# Patient Record
Sex: Female | Born: 1970
Health system: Southern US, Community
[De-identification: ages and names within clinical notes are randomized; demographics above are authoritative.]

## PROBLEM LIST (undated history)

## (undated) ENCOUNTER — Inpatient Hospital Stay (HOSPITAL_COMMUNITY): Payer: Self-pay

## (undated) DIAGNOSIS — O24419 Gestational diabetes mellitus in pregnancy, unspecified control: Secondary | ICD-10-CM

## (undated) DIAGNOSIS — E118 Type 2 diabetes mellitus with unspecified complications: Secondary | ICD-10-CM

## (undated) DIAGNOSIS — I10 Essential (primary) hypertension: Secondary | ICD-10-CM

## (undated) HISTORY — PX: DILATION AND CURETTAGE OF UTERUS: SHX78

## (undated) HISTORY — DX: Type 2 diabetes mellitus with unspecified complications: E11.8

## (undated) HISTORY — DX: Gestational diabetes mellitus in pregnancy, unspecified control: O24.419

## (undated) HISTORY — PX: MASTECTOMY: SHX3

---

## 2000-10-18 ENCOUNTER — Emergency Department (HOSPITAL_COMMUNITY): Admission: EM | Admit: 2000-10-18 | Discharge: 2000-10-18 | Payer: Self-pay | Admitting: Emergency Medicine

## 2001-10-22 ENCOUNTER — Encounter: Payer: Self-pay | Admitting: *Deleted

## 2001-10-22 ENCOUNTER — Inpatient Hospital Stay (HOSPITAL_COMMUNITY): Admission: AD | Admit: 2001-10-22 | Discharge: 2001-10-22 | Payer: Self-pay | Admitting: *Deleted

## 2001-12-05 ENCOUNTER — Emergency Department (HOSPITAL_COMMUNITY): Admission: EM | Admit: 2001-12-05 | Discharge: 2001-12-05 | Payer: Self-pay | Admitting: Emergency Medicine

## 2002-01-17 ENCOUNTER — Inpatient Hospital Stay (HOSPITAL_COMMUNITY): Admission: AD | Admit: 2002-01-17 | Discharge: 2002-01-17 | Payer: Self-pay | Admitting: Obstetrics

## 2002-04-08 ENCOUNTER — Encounter: Payer: Self-pay | Admitting: Obstetrics

## 2002-04-08 ENCOUNTER — Ambulatory Visit (HOSPITAL_COMMUNITY): Admission: RE | Admit: 2002-04-08 | Discharge: 2002-04-08 | Payer: Self-pay | Admitting: Obstetrics

## 2003-03-18 ENCOUNTER — Inpatient Hospital Stay (HOSPITAL_COMMUNITY): Admission: AD | Admit: 2003-03-18 | Discharge: 2003-03-18 | Payer: Self-pay | Admitting: Obstetrics and Gynecology

## 2003-03-28 ENCOUNTER — Inpatient Hospital Stay (HOSPITAL_COMMUNITY): Admission: AD | Admit: 2003-03-28 | Discharge: 2003-03-28 | Payer: Self-pay | Admitting: Obstetrics and Gynecology

## 2003-03-30 ENCOUNTER — Encounter: Admission: RE | Admit: 2003-03-30 | Discharge: 2003-03-30 | Payer: Self-pay | Admitting: Obstetrics and Gynecology

## 2003-04-07 ENCOUNTER — Encounter: Admission: RE | Admit: 2003-04-07 | Discharge: 2003-04-07 | Payer: Self-pay | Admitting: Internal Medicine

## 2003-10-31 ENCOUNTER — Emergency Department (HOSPITAL_COMMUNITY): Admission: EM | Admit: 2003-10-31 | Discharge: 2003-11-01 | Payer: Self-pay | Admitting: Emergency Medicine

## 2004-11-02 ENCOUNTER — Ambulatory Visit (HOSPITAL_COMMUNITY): Admission: RE | Admit: 2004-11-02 | Discharge: 2004-11-02 | Payer: Self-pay | Admitting: Obstetrics & Gynecology

## 2004-12-19 ENCOUNTER — Inpatient Hospital Stay (HOSPITAL_COMMUNITY): Admission: AD | Admit: 2004-12-19 | Discharge: 2004-12-19 | Payer: Self-pay | Admitting: *Deleted

## 2005-04-24 ENCOUNTER — Inpatient Hospital Stay (HOSPITAL_COMMUNITY): Admission: AD | Admit: 2005-04-24 | Discharge: 2005-04-24 | Payer: Self-pay | Admitting: *Deleted

## 2005-05-10 ENCOUNTER — Ambulatory Visit: Payer: Self-pay | Admitting: Obstetrics and Gynecology

## 2005-06-01 ENCOUNTER — Ambulatory Visit: Payer: Self-pay | Admitting: Gynecology

## 2005-08-10 ENCOUNTER — Ambulatory Visit: Payer: Self-pay | Admitting: Gynecology

## 2005-11-14 ENCOUNTER — Ambulatory Visit: Payer: Self-pay | Admitting: Gynecology

## 2007-02-09 ENCOUNTER — Emergency Department (HOSPITAL_COMMUNITY): Admission: EM | Admit: 2007-02-09 | Discharge: 2007-02-10 | Payer: Self-pay | Admitting: Emergency Medicine

## 2007-02-21 ENCOUNTER — Ambulatory Visit: Payer: Self-pay | Admitting: Obstetrics & Gynecology

## 2007-04-01 ENCOUNTER — Encounter: Payer: Self-pay | Admitting: Obstetrics & Gynecology

## 2007-04-01 ENCOUNTER — Ambulatory Visit (HOSPITAL_COMMUNITY): Admission: RE | Admit: 2007-04-01 | Discharge: 2007-04-01 | Payer: Self-pay | Admitting: Obstetrics & Gynecology

## 2007-04-01 ENCOUNTER — Ambulatory Visit: Payer: Self-pay | Admitting: Obstetrics & Gynecology

## 2007-05-26 ENCOUNTER — Inpatient Hospital Stay (HOSPITAL_COMMUNITY): Admission: AD | Admit: 2007-05-26 | Discharge: 2007-05-26 | Payer: Self-pay | Admitting: Obstetrics & Gynecology

## 2007-06-18 ENCOUNTER — Ambulatory Visit: Payer: Self-pay | Admitting: Obstetrics and Gynecology

## 2007-09-05 ENCOUNTER — Encounter: Payer: Self-pay | Admitting: Obstetrics & Gynecology

## 2007-09-05 ENCOUNTER — Ambulatory Visit: Payer: Self-pay | Admitting: Obstetrics & Gynecology

## 2007-11-07 ENCOUNTER — Encounter: Payer: Self-pay | Admitting: Obstetrics & Gynecology

## 2007-11-07 ENCOUNTER — Ambulatory Visit: Payer: Self-pay | Admitting: Obstetrics & Gynecology

## 2007-11-07 LAB — CONVERTED CEMR LAB
ALT: 20 units/L (ref 0–35)
Albumin: 3.8 g/dL (ref 3.5–5.2)
Alkaline Phosphatase: 55 units/L (ref 39–117)
BUN: 8 mg/dL (ref 6–23)
CO2: 22 meq/L (ref 19–32)
Calcium: 8.5 mg/dL (ref 8.4–10.5)
Creatinine, Ser: 0.67 mg/dL (ref 0.40–1.20)
Potassium: 4.1 meq/L (ref 3.5–5.3)
Sodium: 140 meq/L (ref 135–145)
TSH: 2.126 microintl units/mL (ref 0.350–4.50)
Total Bilirubin: 0.3 mg/dL (ref 0.3–1.2)

## 2007-12-12 ENCOUNTER — Encounter: Payer: Self-pay | Admitting: Obstetrics & Gynecology

## 2007-12-12 ENCOUNTER — Ambulatory Visit: Payer: Self-pay | Admitting: Obstetrics and Gynecology

## 2007-12-12 LAB — CONVERTED CEMR LAB
ALT: 18 units/L (ref 0–35)
AST: 18 units/L (ref 0–37)
BUN: 12 mg/dL (ref 6–23)
CO2: 21 meq/L (ref 19–32)
Chloride: 107 meq/L (ref 96–112)
Hemoglobin: 10.2 g/dL — ABNORMAL LOW (ref 12.0–15.0)
Platelets: 463 10*3/uL — ABNORMAL HIGH (ref 150–400)
RBC: 4.59 M/uL (ref 3.87–5.11)
RDW: 16 % — ABNORMAL HIGH (ref 11.5–15.5)
Total Bilirubin: 0.2 mg/dL — ABNORMAL LOW (ref 0.3–1.2)
Total Protein: 7.3 g/dL (ref 6.0–8.3)

## 2008-10-02 ENCOUNTER — Inpatient Hospital Stay (HOSPITAL_COMMUNITY): Admission: AD | Admit: 2008-10-02 | Discharge: 2008-10-02 | Payer: Self-pay | Admitting: Obstetrics & Gynecology

## 2008-12-12 ENCOUNTER — Emergency Department (HOSPITAL_COMMUNITY): Admission: EM | Admit: 2008-12-12 | Discharge: 2008-12-12 | Payer: Self-pay | Admitting: Emergency Medicine

## 2009-01-08 HISTORY — PX: HYSTEROSCOPY WITH RESECTOSCOPE: SHX5395

## 2009-02-01 ENCOUNTER — Emergency Department (HOSPITAL_COMMUNITY): Admission: EM | Admit: 2009-02-01 | Discharge: 2009-02-01 | Payer: Self-pay | Admitting: Emergency Medicine

## 2009-02-06 ENCOUNTER — Inpatient Hospital Stay (HOSPITAL_COMMUNITY): Admission: AD | Admit: 2009-02-06 | Discharge: 2009-02-06 | Payer: Self-pay | Admitting: Obstetrics and Gynecology

## 2009-02-06 ENCOUNTER — Ambulatory Visit: Payer: Self-pay | Admitting: Obstetrics and Gynecology

## 2009-07-28 ENCOUNTER — Encounter: Admission: RE | Admit: 2009-07-28 | Discharge: 2009-07-28 | Payer: Self-pay | Admitting: Family Medicine

## 2009-10-07 ENCOUNTER — Ambulatory Visit (HOSPITAL_COMMUNITY): Admission: RE | Admit: 2009-10-07 | Discharge: 2009-10-07 | Payer: Self-pay | Admitting: Obstetrics and Gynecology

## 2009-12-30 ENCOUNTER — Inpatient Hospital Stay (HOSPITAL_COMMUNITY)
Admission: AD | Admit: 2009-12-30 | Discharge: 2009-12-30 | Payer: Self-pay | Source: Home / Self Care | Attending: Obstetrics and Gynecology | Admitting: Obstetrics and Gynecology

## 2010-03-20 LAB — POCT PREGNANCY, URINE: Preg Test, Ur: NEGATIVE

## 2010-03-20 LAB — URINALYSIS, ROUTINE W REFLEX MICROSCOPIC
Bilirubin Urine: NEGATIVE
Specific Gravity, Urine: 1.02 (ref 1.005–1.030)
Urobilinogen, UA: 0.2 mg/dL (ref 0.0–1.0)
pH: 5.5 (ref 5.0–8.0)

## 2010-03-23 LAB — BASIC METABOLIC PANEL
BUN: 6 mg/dL (ref 6–23)
Calcium: 9.2 mg/dL (ref 8.4–10.5)
GFR calc non Af Amer: >60 mL/min (ref >60)
Potassium: 4.2 mEq/L (ref 3.5–5.1)

## 2010-03-23 LAB — CBC
HCT: 29.9 % — ABNORMAL LOW (ref 36.0–46.0)
MCH: 23.9 pg — ABNORMAL LOW (ref 26.0–34.0)
MCHC: 32.9 g/dL (ref 30.0–36.0)
MCV: 72.6 fL — ABNORMAL LOW (ref 78.0–100.0)
Platelets: 351 10*3/uL (ref 150–400)
WBC: 8 10*3/uL (ref 4.0–10.5)

## 2010-03-23 LAB — HCG, SERUM, QUALITATIVE: Preg, Serum: NEGATIVE

## 2010-04-11 LAB — POCT I-STAT, CHEM 8
BUN: 5 mg/dL — ABNORMAL LOW (ref 6–23)
Chloride: 100 mEq/L (ref 96–112)
Glucose, Bld: 263 mg/dL — ABNORMAL HIGH (ref 70–99)
TCO2: 24 mmol/L (ref 0–100)

## 2010-04-14 LAB — HCG, QUANTITATIVE, PREGNANCY: hCG, Beta Chain, Quant, S: <2 m[IU]/mL (ref <5)

## 2010-04-14 LAB — WET PREP, GENITAL
Clue Cells Wet Prep HPF POC: NONE SEEN
Trich, Wet Prep: NONE SEEN

## 2010-04-14 LAB — CBC
HCT: 34.6 % — ABNORMAL LOW (ref 36.0–46.0)
Hemoglobin: 11.4 g/dL — ABNORMAL LOW (ref 12.0–15.0)
MCV: 74.3 fL — ABNORMAL LOW (ref 78.0–100.0)
RBC: 4.66 MIL/uL (ref 3.87–5.11)

## 2010-04-14 LAB — URINALYSIS, ROUTINE W REFLEX MICROSCOPIC
Glucose, UA: NEGATIVE mg/dL
pH: 5.5 (ref 5.0–8.0)

## 2010-04-14 LAB — URINE MICROSCOPIC-ADD ON

## 2010-04-14 LAB — ABO/RH: ABO/RH(D): O POS

## 2010-05-23 NOTE — Op Note (Signed)
[name], [name]                ACCOUNT NO.:  [id number]   MEDICAL RECORD NO.:  [id number]          PATIENT TYPE:  AMB   LOCATION:  [facility]C                           [facility]   PHYSICIAN:  Allie Bossier, MD        DATE OF BIRTH:  [birth year]   DATE OF PROCEDURE:  04/01/2007  DATE OF DISCHARGE:                               OPERATIVE REPORT   PREOPERATIVE DIAGNOSES:  1. Anemia.  2. Dysfunctional uterine bleeding.  3. Obesity.   POSTOPERATIVE DIAGNOSES:  1. Anemia.  2. Dysfunctional uterine bleeding.  3. Obesity.   PROCEDURE:  Hysteroscopy D&C.   FINDINGS:  Uterine polyp.   SURGEON:  Clarisa Kindred, MD   ANESTHESIA:  LMA.  Oddono, MD   COMPLICATIONS:  None.   ESTIMATED BLOOD LOSS:  Minimal.   SPECIMEN:  Uterine curettings with polyp.   DETAILED PROCEDURE AND FINDINGS:  The risks, benefits, alternatives of  surgery were explained, understood, and accepted.  Consents were signed.  She was taken to the operating room; general anesthesia was applied  without complication.  She was placed in dorsal lithotomy position.  Her  vagina was prepped and draped usual sterile fashion.  Her bladder was  emptied with a Robinson catheter.  A bimanual exam revealed a normal  size and shape, anteverted, mobile uterus.  Her adnexa were not  enlarged.  A speculum was placed.  Her paracervix was grasped on the  anterior lip of the cervix with a single-tooth tenaculum.  Cervix was  easily dilated with Hanks dilators to accommodate a diagnostic  hysteroscope.  Glycine was the distention media.  Most of the uterine  cavity appeared atrophic with the obvious exception being a 1 cm polyp  dangling from the posterior wall towards the right of the uterus.  A  sharp curettage with serrated edges was used in all quadrants and the  fundus of the uterus until a gritty sensation was appreciated  throughout.  Hysteroscopy was repeated.  A picture was taken, and the  polyp was  gone.  No bleeding was noted.   The tenaculum was removed.  No bleeding  was noted from that site either.  Net loss of the glycine was  approximately 20 mL.  She was extubated and taken to the recovery room  in stable condition.  These instrument, sponge, and needle counts were  correct.      Allie Bossier, MD  Electronically Signed     MCD/MEDQ  D:  04/01/2007  T:  04/01/2007  Job:  [id number]

## 2010-05-23 NOTE — Group Therapy Note (Signed)
Desiree, [name] NO.:  [id number]   MEDICAL RECORD NO.:  [id number]          PATIENT TYPE:  WOC   LOCATION:  [facility]                   FACILITY:  [facility]   PHYSICIAN:  Odie Sera, D.O.    DATE OF BIRTH:  05-20-1970   DATE OF SERVICE:                                  CLINIC NOTE   REASON FOR VISIT:  [name] is a 40 year old nulliparous patient who  has been followed for infertility and presents here today for  reevaluation after her last appointment 2 months ago.  She notes at this  time she is having a period of bleeding that began approximately 2 weeks  ago; however, this is normal and she relates a history that well  describes an ovulatory bleeding to include cycles that occur  infrequently sometimes several months apart and often has episodes of  bleeding lasting 1-3 weeks.  She has been previously worked up for an  fertility in the past and has had numerous cycles of Clomid without  success.  Of note, she tried a cycle of Clomid and after her last visit  in August; however, they did not realize that there were refills on the  package, so they only did one cycle.  Otherwise she denies any abdominal  pain.   PAST MEDICAL HISTORY:  Significant only for infertility.   She has no known drug allergies.   CURRENT MEDICATIONS:  Iron 1 pill 3 times daily.   Current menstrual cycle had began on October 25, 2007, and her last Pap  smear was August 2009.   On evaluation today, she had a blood pressure of 140/76, heart rate of  89, respiratory rate of 20 and temperature 97.6.  She is 5 feet 5 inches  tall and weighs approximately 213 pounds.  This corresponds to a BMI of  35.  Her lungs were clear to auscultation bilaterally.  Her abdomen is  soft, nontender, no masses appreciated and is obese.  Her skin exam, she  is noted to have fine hair along her forearms and chin.   ASSESSMENT AND PLAN:  Primary infertility.  Given the patient's history  and  report of a normal semen analysis in the husband, I suspect that the  patient is essentially anovulatory.  Her bleeding definitely is  descriptive for anovulatory bleeding.  She has apparently had numerous  courses of Clomid without results.  On review of her records, her last  TSH is from 2005, so I signed a repeat one again today.  Additionally,  the patient appears to clinically meet the criteria for polycystic  ovarian syndrome and it does not appear that she has had a trial of  metformin.  Therefore, the patient was given a prescription for  metformin ER 500 mg twice a day in addition to another 2 courses of  Provera and Clomid 50 mg.  The patient was instructed to follow up in 1  month for recheck of her labs, to recheck a creatinine and LFTs after  the patient had been on metformin for 1 month.  Also, instructed on  use of metformin, as well  as the rare side effect of hypoglycemia and  helped to handle that.  The patient will follow up in 47-month to see Dr.  Noel Gerold.           ______________________________  Odie Sera, D.O.     MC/MEDQ  D:  11/07/2007  T:  11/07/2007  Job:  [id number]

## 2010-05-23 NOTE — H&P (Signed)
[name], [name]                ACCOUNT NO.:  [id number]   MEDICAL RECORD NO.:  [id number]          PATIENT TYPE:  AMB   LOCATION:  [facility]                           [facility]   PHYSICIAN:  Allie Bossier, MD        DATE OF BIRTH:  Feb 26, 1970   DATE OF ADMISSION:  04/01/2007  DATE OF DISCHARGE:                              HISTORY & PHYSICAL   [name] is a 40 year old married black gravida 1, para 1 with no living  children.  She has been having dysfunctional heavy bleeding for several  years, and her hemoglobin was noted to be 6 in 2007.  She has had  multiple ultrasounds that have shown no specific abnormalities.  Saline  infusion sonography was recommended; however, with her continuous  vaginal bleeding, this has not been performed.  Her hemoglobin then  increased to 7.8 in February of 2009 when she showed up in maternal  admissions unit.  She has been treated with p.o. Provera with some  improvement of her vaginal bleeding.  In fact, her hemoglobin yesterday  was up to 11.5.  She has been trying to achieve pregnancy for several  years, and in fact has had HSG which was done in 2006 which was reported  as normal.  Lab workup for her anemia has been normal (TSH, Von  Willebrand's panel).   SOCIAL HISTORY:  Negative for tobacco, alcohol or drug use.   FAMILY HISTORY:  His is negative for breast, GYN and colon malignancies.   ALLERGIES:  NO KNOWN DRUG ALLERGIES.  NO LATEX ALLERGIES.   MEDICATIONS:  1. Vicodin p.r.n. (she had a tooth extraction recently).  2. Iron pills daily.   PAST SURGICAL HISTORY:  She had a tooth extraction.   PAST OBSTETRICAL HISTORY:  She had a NSVD in 1998.   REVIEW OF SYSTEMS:  She is married.  She is trying to achieve pregnancy.   PHYSICAL EXAMINATION:  VITAL SIGNS:  She is 215 pounds, height 5 feet 5  inches.  Vital signs stable, afebrile.  HEENT: Normal.  HEART:  Regular rate and rhythm.  LUNGS:  Clear to auscultation bilaterally.  ABDOMEN:   Obese, benign.  PELVIC:  External genitalia normal.  No adnexal masses, but the exam is  limited by her habitus.  Her uterus is normal size and shape, anteverted  and mobile.   ASSESSMENT AND PLAN:  Dysfunctional uterine bleeding and resulting  anemia (severe at times).  Will plan for a dilatation and curettage and  hysteroscopy.  I have recommended her to stay on a multivitamin with  folic acid daily since she is attempting to become pregnant.      Allie Bossier, MD  Electronically Signed     MCD/MEDQ  D:  04/01/2007  T:  04/01/2007  Job:  [id number]

## 2010-05-23 NOTE — Group Therapy Note (Signed)
NAMEFAYTH, [name] NO.:  [id number]   MEDICAL RECORD NO.:  [id number]          PATIENT TYPE:  WOC   LOCATION:  [facility]                   [facility]AC[facility]   PHYSICIAN:  Elsie Lincoln, MD      DATE OF BIRTH:  [birth year]   DATE OF SERVICE:                                  CLINIC NOTE   Ms. Pal returns to [place] for amenorrhea again.  The patient had D&C,  hysteroscopy, and removal of what was thought to be a polyp in March  2009.  Pathology returned normal endometrium.  The patient has irregular  periods, one every 1-3 months.  She has had a history of DUB in the past  with severe anemia in the past and was on Depo.  She had been on Depo  for many years.  She has had normal semen analysis per the husband, and  she also has had a normal hysterosalpingogram, which showed both  fallopian tubes filled and demonstrated normal morphology, and this was  done in August 2006.  The patient has had infertility treatments by Dr.  Gaynell Face, Dr. August Saucer, and Dr. Chevis Pretty.  The patient has had Clomid in the  past and did not get pregnant.  She has been pregnant once in the past  at [place], but apparently that baby passed away.  I do not see any Pap  smear in the chart, so we will do that today.  She probably has PCO and  needs to be cycled, as she does not have endometrial hyperplasia, and we  will do that today.  We will also give her some Clomid and try 3 cycles  after that.  We will refer her once, I do not think we can help her any  more.  Today's visit is for amenorrhea.   PHYSICAL EXAMINATION:  VITAL SIGNS:  Temperature 97.3, pulse 86, blood  pressure 132/84, weight 214, and height 5 feet 6 inches.  GENERAL:  Well nourished, well developed, in no apparent distress.  ABDOMEN:  Soft, nontender.  No rebound.  No guarding.  No organomegaly.  No hernia.  GENITALIA:  Tanner V.  Vagina pink, normal rugae.  Cervix, closed and  nontender.   ASSESSMENT AND PLAN:  The patient is a  40 year old female for  amenorrhea, polycystic ovary, and infertility secondary to her medical  problems.  1. Pap smear done today.  2. We will give Provera withdrawal bleed.  3. We will try Clomid 50 mg.  4. I think she needs a referral to a true reproductive endocrinologist      as I think we have exhausted on that this year.  They are      interested in intrauterine insemination (IUI)  but [facility]      [facility] can no longer do this.           ______________________________  Elsie Lincoln, MD     KL/MEDQ  D:  09/05/2007  T:  09/06/2007  Job:  [id number]

## 2010-05-23 NOTE — Group Therapy Note (Signed)
[name], [name] NO.:  [id number]   MEDICAL RECORD NO.:  [id number]          PATIENT TYPE:  WOC   LOCATION:  [facility]                   [facility]AC[facility]   PHYSICIAN:  Argentina Donovan, MD        DATE OF BIRTH:  1970/05/06   DATE OF SERVICE:  06/18/2007                                  CLINIC NOTE   The patient is a 40 year old African female, gravida 1, para 1-0-0-0,  whose baby passed away in [place].  The patient, herself, speaks a strange  dialect from which we have to go to [place] for translation.  The  dialect is Hausa.  Her husband, however, does speak pretty good [place],  since he spent most of his life in [place].  The problem is the patient  has been bothered by dysfunctional uterine bleeding and in March was  placed on Depo-Provera to control that.  It has well controlled it, but  the patient desires to get pregnant.  I told them to come back at the  end of July and would start them to try and get them to ovulate on  Clomid at that particular time.  The patient has had an  hysterosalpingogram in the past, which was normal.  My feeling is that  she has polycystic ovarian syndrome, although the ultrasound that she  had was normal.  We expect the patient back at the end of July.  My plan  is to start her on Clomid at that particular time in order to try and  end with a conception.           ______________________________  Argentina Donovan, MD     PR/MEDQ  D:  06/18/2007  T:  06/18/2007  Job:  [id number]

## 2010-05-23 NOTE — Group Therapy Note (Signed)
NAMEKAMALI, SAKATA NO.:  [id number]   MEDICAL RECORD NO.:  [id number]          PATIENT TYPE:  WOC   LOCATION[facility]                   [facility]   PHYSICIAN:  Allie Bossier, MD        DATE OF BIRTH:  1970/04/02   DATE OF SERVICE:  12/12/2007                                  CLINIC NOTE   VITAL SIGNS:  Temperature 97.4, pulse 84, blood pressure 121/81,  respirations 16, weight 212.2 pounds.  Height 5 feet 5-1/4 inches.   Last menstrual period ended 10 days ago; however, the patient cannot  remember what day it started.   CHIEF COMPLAINT:  Infertility.   HISTORY:  Mrs. Henigan is a 40 year old gravida 1, para 1-0-0-0.  She did  have one baby in [place] who did not live.  She has been followed in the  clinic for infertility for sometime and then she presents here today for  followup for infertility and labs that were done at her last appointment  on November 07, 2007.  In short, she is a patient who has had multiple  trials of Clomid and gives a history of irregular bleeding that appears  to be anovulatory. She has had a normal hysterosalpingogram in 2006.  Her husband has had a normal semen analysis.  It has been thought for  awhile that she probably has polycystic ovarian syndrome.  On her last  visit she was started on metformin ER 500 mg p.o. b.i.d.  She was also  given another two courses of Provera and Clomid 50 mg.  She was asked to  follow up in one month to recheck her labs and see how she was doing on  the metformin.  She reports to me that she has had no problems with the  metformin. As mentioned previously her last menstrual period ended 10  days ago but she does remember exactly when it started.  She reports  that she has taken her two cycles of Provera and Clomid.   LABORATORY DATA:  Her labs were mostly within normal limits with the  exception of a glucose which was 115.  Creatinine was 0.67.  Her thyroid  studies were normal with a TSH  of 2.126, a free T4 of 1.17.  Her liver  function tests were within normal limits.  Of note, she also has a  history of severe anemia and the last CBC that I see on the chart is  from February 2009 where she had a hemoglobin of 7.8 and an iron study  showed her to be severely iron deficient with an iron level of 29 and a  percent saturation of 7, reticulocyte count of 4.9%.  I do not see  repeat CBCs of hemoglobin since that time.   PHYSICAL EXAMINATION:  GENERAL:  She is very pleasant, overweight woman  with perhaps some mild hirsutism and perhaps acanthosis nigricans of the  neck.  LUNGS:  Clear to auscultation bilaterally.  HEART:  Regular rate and rhythm.  No murmur, rub, or gallops.  ABDOMEN:  Obese.  Soft, nontender, nondistended with normoactive bowel  sounds.   ASSESSMENT/PLAN:  The patient here for primary infertility and given  that she has had a number of cycles of Clomid and Provera, it is  probably time to move on to the next level. Hopefully, the metformin  will help with her infertility if she indeed has polycystic ovarian  syndrome, but she is likely to need a reproductive specialist to help  her get pregnant.  So today we will recheck a CMET and will go ahead and  check a CBC since she apparently has not had one in quite awhile.  We  will increase her metformin ER to 1000 mg p.o. b.i.d. and will refer her  on to a  reproductive specialist at the time of this dictation. R.N. Marijean Bravo is discussing with them the referral process for reproductive  endocrinology.     ______________________________  Asher Muir    ______________________________  Allie Bossier, MD    /MEDQ  D:  12/12/2007  T:  12/12/2007  Job:  [id number]

## 2010-05-26 NOTE — Group Therapy Note (Signed)
NAMEJENYFER, [name] NO.:  [id number]   MEDICAL RECORD NO.:  [id number]          PATIENT TYPE:  WOC   [facility]ATION[facility]                   [facility]   PHYSICIAN:  Argentina Donovan, MD        DATE OF BIRTH:  [birth year]   DATE OF SERVICE:                                    CLINIC NOTE   HISTORY OF PRESENT ILLNESS:  The patient is a 40 year old gravida 1, para 1-  0-0-0 who lost her baby at 30 months' old while living in [place].  Her husband was here working at the time.  He brought her over in  2001, and they tried to get pregnant.  They had a workup with Dr. __________  from whom we are trying to get records.  He has had multiple sperm counts  and then normal hysterosalpingogram.  However, this visit is due to  something else.  She had been bleeding for 2 months, and has had a history  of somewhat irregular periods, and went into the MAU on April 24, 2005, had  a hemoglobin of 6.0 with a hematocrit of 19.0.  At that time, they put her  on Loestrin Fe.  Prior to that, last month, she saw a doctor who started her  on Provera for 10 days.  That did stop her bleeding, but when she stopped  the Provera, the bleeding once again became extremely heavy.  She is  bleeding as of today, had a Pap smear less than a year ago, and has an  ultrasound which shows a normal pelvic anatomy.  We talked to them about  stopping the periods long enough to let her build up her hemoglobin to  normal and then we could look into her infertility if she desired.  We are  going to put her on Ferro-Sequels as she does have a problem with  constipation, and have her checked again in 2 months.  We have also  counseled them to come back if the bleeding has not stopped significantly  within the next few days.  We are going to start her on Depo Provera 150  and, as I said,  Ferro-Sequels.   IMPRESSION:  1.  Probable dysfunctional uterine bleeding.  2.  Infertility.     ______________________________  Argentina Donovan, MD     PR/MEDQ  D:  05/10/2005  T:  05/10/2005  Job:  [id number]

## 2010-09-29 LAB — BASIC METABOLIC PANEL
CO2: 25
Chloride: 103
Creatinine, Ser: 0.78
GFR calc Af Amer: >60
Potassium: 3.6
Sodium: 135

## 2010-09-29 LAB — CBC
HCT: 23.4 — ABNORMAL LOW
Hemoglobin: 7.8 — CL
MCHC: 33.2
MCV: 77.6 — ABNORMAL LOW
RBC: 3.02 — ABNORMAL LOW
WBC: 9.7

## 2010-09-29 LAB — ABO/RH: ABO/RH(D): O POS

## 2010-09-29 LAB — CROSSMATCH

## 2010-09-29 LAB — POCT PREGNANCY, URINE: Operator id: 297281

## 2010-09-29 LAB — URINALYSIS, ROUTINE W REFLEX MICROSCOPIC
Bilirubin Urine: NEGATIVE
Glucose, UA: NEGATIVE
Hgb urine dipstick: NEGATIVE
Protein, ur: NEGATIVE

## 2010-09-29 LAB — DIFFERENTIAL
Basophils Relative: 0
Eosinophils Absolute: 0.1
Lymphs Abs: 2
Monocytes Absolute: 0.5
Monocytes Relative: 5
Neutrophils Relative %: 73

## 2010-09-29 LAB — WET PREP, GENITAL
Clue Cells Wet Prep HPF POC: NONE SEEN
Trich, Wet Prep: NONE SEEN
Yeast Wet Prep HPF POC: NONE SEEN

## 2010-09-29 LAB — GC/CHLAMYDIA PROBE AMP, GENITAL: Chlamydia, DNA Probe: NEGATIVE

## 2010-09-29 LAB — RPR: RPR Ser Ql: NONREACTIVE

## 2010-10-02 LAB — CBC
HCT: 34.6 — ABNORMAL LOW
Hemoglobin: 11.5 — ABNORMAL LOW
MCHC: 33.2
RBC: 4.53
RDW: 15.3

## 2010-10-04 LAB — CBC
HCT: 24.8 — ABNORMAL LOW
Hemoglobin: 8.3 — ABNORMAL LOW
RBC: 3.42 — ABNORMAL LOW
RDW: 16.4 — ABNORMAL HIGH

## 2010-10-04 LAB — POCT PREGNANCY, URINE: Operator id: 120561

## 2011-08-07 ENCOUNTER — Inpatient Hospital Stay (HOSPITAL_COMMUNITY)
Admission: AD | Admit: 2011-08-07 | Discharge: 2011-08-07 | Disposition: A | Discharge status: Home / Self Care | Payer: BC Managed Care – PPO | Source: Ambulatory Visit | Attending: Family Medicine | Admitting: Family Medicine

## 2011-08-07 ENCOUNTER — Encounter (HOSPITAL_COMMUNITY): Payer: Self-pay | Admitting: *Deleted

## 2011-08-07 DIAGNOSIS — N938 Other specified abnormal uterine and vaginal bleeding: Secondary | ICD-10-CM | POA: Insufficient documentation

## 2011-08-07 DIAGNOSIS — N949 Unspecified condition associated with female genital organs and menstrual cycle: Secondary | ICD-10-CM | POA: Insufficient documentation

## 2011-08-07 MED ORDER — IBUPROFEN 600 MG PO TABS
600.0000 mg | ORAL_TABLET | Freq: Once | ORAL | Status: AC
  Administered 2011-08-07: 600 mg via ORAL
  Filled 2011-08-07: qty 1

## 2011-08-07 NOTE — MAU Note (Signed)
NO BIRTH CONTROL.  LAST SEX- YESTERDAY. DID HOME PREG TEST- 2 WEEKS AGO-    UNSURE OF RESULTS..     . SAYS VAG BLEEDING  STARTED 0130- WHEN SHE WIPED.. NO PAD ON.

## 2011-10-30 ENCOUNTER — Emergency Department (HOSPITAL_COMMUNITY)
Admission: EM | Admit: 2011-10-30 | Discharge: 2011-10-30 | Disposition: A | Discharge status: Home / Self Care | Payer: Self-pay | Attending: Emergency Medicine | Admitting: Emergency Medicine

## 2011-10-30 ENCOUNTER — Encounter (HOSPITAL_COMMUNITY): Payer: Self-pay | Admitting: Vascular Surgery

## 2011-10-30 DIAGNOSIS — N949 Unspecified condition associated with female genital organs and menstrual cycle: Secondary | ICD-10-CM | POA: Insufficient documentation

## 2011-10-30 DIAGNOSIS — D649 Anemia, unspecified: Secondary | ICD-10-CM | POA: Insufficient documentation

## 2011-10-30 DIAGNOSIS — R7309 Other abnormal glucose: Secondary | ICD-10-CM | POA: Insufficient documentation

## 2011-10-30 DIAGNOSIS — R739 Hyperglycemia, unspecified: Secondary | ICD-10-CM

## 2011-10-30 DIAGNOSIS — N938 Other specified abnormal uterine and vaginal bleeding: Secondary | ICD-10-CM | POA: Insufficient documentation

## 2011-10-30 DIAGNOSIS — Z9889 Other specified postprocedural states: Secondary | ICD-10-CM | POA: Insufficient documentation

## 2011-10-30 LAB — CBC WITH DIFFERENTIAL/PLATELET
Basophils Absolute: 0.1 10*3/uL (ref 0.0–0.1)
Eosinophils Relative: 3 % (ref 0–5)
Lymphocytes Relative: 34 % (ref 12–46)
MCV: 67 fL — ABNORMAL LOW (ref 78.0–100.0)
Monocytes Relative: 7 % (ref 3–12)
Neutrophils Relative %: 55 % (ref 43–77)
Platelets: 324 10*3/uL (ref 150–400)
RBC: 3.52 MIL/uL — ABNORMAL LOW (ref 3.87–5.11)
RDW: 17.5 % — ABNORMAL HIGH (ref 11.5–15.5)
WBC: 9.8 10*3/uL (ref 4.0–10.5)

## 2011-10-30 LAB — URINE MICROSCOPIC-ADD ON

## 2011-10-30 LAB — URINALYSIS, ROUTINE W REFLEX MICROSCOPIC
Glucose, UA: >1000 mg/dL — AB
Hgb urine dipstick: NEGATIVE
Protein, ur: NEGATIVE mg/dL
pH: 6 (ref 5.0–8.0)

## 2011-10-30 LAB — BASIC METABOLIC PANEL
Chloride: 95 mEq/L — ABNORMAL LOW (ref 96–112)
GFR calc Af Amer: >90 mL/min (ref >90)
GFR calc non Af Amer: >90 mL/min (ref >90)
Potassium: 4 mEq/L (ref 3.5–5.1)
Sodium: 132 mEq/L — ABNORMAL LOW (ref 135–145)

## 2011-10-30 LAB — PREGNANCY, URINE: Preg Test, Ur: NEGATIVE

## 2011-10-30 MED ORDER — FERROUS SULFATE 325 (65 FE) MG PO TABS: 325.0000 mg | ORAL_TABLET | Freq: Every day | ORAL | Status: DC

## 2011-10-30 NOTE — ED Notes (Signed)
Pt reports that she has been bleeding heavily for a month now. States that she has to change her pad about 3 times in one hour for a normal period she would only change it one time per hour. Before this episode it had been 3 months since her last period.States that for the past week she has had been feeling weak and lightheaded for the past week. Also reports an intermittent HA for the past month.

## 2011-10-30 NOTE — ED Provider Notes (Signed)
History     CSN: [id number]  Arrival date & time 10/30/11  0104   First MD Initiated Contact with Patient 10/30/11 [phone]      Chief Complaint  Patient presents with  . Vaginal Bleeding    (Consider location/radiation/quality/duration/timing/severity/associated sxs/prior treatment) HPI 41 year old female presents to emergency room complaining of one month of ongoing vaginal bleeding. She reports bleeding has been heavy at times up to 3 pads an hour. Over the last week bleeding has decreased somewhat. Patient reports she is now getting weak and dizzy when standing. Patient has had ongoing dysfunctional uterine bleeding for many years. She and her husband report frequent visits to the [facility] clinics without specific diagnosis. She has had a D&C twice for heavy menstrual bleeding. Patient had been on iron in the past, not currently. No fevers or chills, no urinary complaints. No vaginal discharge. Patient reports low abdominal pain which is no worse than her baseline  History reviewed. No pertinent past medical history.  Past Surgical History  Procedure Date  . Dilation and curettage of uterus   . Hysteroscopy with resectoscope 2011    No family history on file.  History  Substance Use Topics  . Smoking status: Never Smoker   . Smokeless tobacco: Not on file  . Alcohol Use: No    OB History    Grav Para Term Preterm Abortions TAB SAB Ect Mult Living   1 0 0             Review of Systems  Unable to perform ROS language barrier  Allergies  Review of patient's allergies indicates no known allergies.  Home Medications  No current outpatient prescriptions on file.  BP 139/83  Pulse 95  Temp 98.2 F (36.8 C) (Oral)  Resp 20  SpO2 99%  LMP 10/30/2011  Physical Exam  Nursing note and vitals reviewed. Constitutional: She is oriented to person, place, and time. She appears well-developed and well-nourished.  HENT:  Head: Normocephalic and atraumatic.  Nose:  Nose normal.  Mouth/Throat: Oropharynx is clear and moist.  Eyes: Conjunctivae normal and EOM are normal. Pupils are equal, round, and reactive to light.  Neck: Normal range of motion. Neck supple. No JVD present. No tracheal deviation present. No thyromegaly present.  Cardiovascular: Normal rate, regular rhythm, normal heart sounds and intact distal pulses.  Exam reveals no gallop and no friction rub.   No murmur heard. Pulmonary/Chest: Effort normal and breath sounds normal. No stridor. No respiratory distress. She has no wheezes. She has no rales. She exhibits no tenderness.  Abdominal: Soft. Bowel sounds are normal. She exhibits no distension and no mass. There is tenderness (mild lower abdominal tenderness without rebound or guarding). There is no rebound and no guarding.  Genitourinary:       External genitalia normal, dark blood noted in vaginal vault. No active bleeding at this time. Cervix normal, off closed. No cervical motion tenderness. Mild diffuse tenderness on bimanual without masses noted, no specific pain in the adnexa or uterus  Musculoskeletal: Normal range of motion. She exhibits no edema and no tenderness.  Lymphadenopathy:    She has no cervical adenopathy.  Neurological: She is alert and oriented to person, place, and time. She has normal reflexes. She exhibits normal muscle tone. Coordination normal.  Skin: Skin is warm and dry. No rash noted. No erythema. No pallor.  Psychiatric: She has a normal mood and affect. Her behavior is normal. Judgment and thought content normal.    ED  Course  Procedures (including critical care time)  Labs Reviewed  URINALYSIS, ROUTINE W REFLEX MICROSCOPIC - Abnormal; Notable for the following:    Glucose, UA >1000 (*)     Ketones, ur 15 (*)     Leukocytes, UA SMALL (*)     All other components within normal limits  CBC WITH DIFFERENTIAL - Abnormal; Notable for the following:    RBC 3.52 (*)     Hemoglobin 7.0 (*)     HCT 23.6 (*)       MCV 67.0 (*)     MCH 19.9 (*)     MCHC 29.7 (*)     RDW 17.5 (*)     All other components within normal limits  BASIC METABOLIC PANEL - Abnormal; Notable for the following:    Sodium 132 (*)     Chloride 95 (*)     Glucose, Bld 252 (*)     All other components within normal limits  URINE MICROSCOPIC-ADD ON - Abnormal; Notable for the following:    Squamous Epithelial / LPF FEW (*)     All other components within normal limits  PREGNANCY, URINE  SAMPLE TO BLOOD BANK   No results found.   1. Anemia   2. Dysfunctional uterine bleeding   3. Hyperglycemia       MDM  41 year old female with dysfunctional uterine bleeding now with some dizziness. No active bleeding on exam. We'll check pregnancy test, UA, CBC for possible anemia.        Desiree Mackie, MD 10/30/11 [phone]

## 2011-10-31 LAB — SAMPLE TO BLOOD BANK

## 2012-06-17 ENCOUNTER — Inpatient Hospital Stay (HOSPITAL_COMMUNITY)
Admission: AD | Admit: 2012-06-17 | Discharge: 2012-06-18 | Disposition: A | Discharge status: Home / Self Care | Payer: Self-pay | Source: Ambulatory Visit | Attending: Obstetrics & Gynecology | Admitting: Obstetrics & Gynecology

## 2012-06-17 ENCOUNTER — Encounter (HOSPITAL_COMMUNITY): Payer: Self-pay | Admitting: Obstetrics and Gynecology

## 2012-06-17 DIAGNOSIS — A088 Other specified intestinal infections: Secondary | ICD-10-CM | POA: Insufficient documentation

## 2012-06-17 DIAGNOSIS — A084 Viral intestinal infection, unspecified: Secondary | ICD-10-CM

## 2012-06-17 DIAGNOSIS — R197 Diarrhea, unspecified: Secondary | ICD-10-CM | POA: Insufficient documentation

## 2012-06-17 DIAGNOSIS — R109 Unspecified abdominal pain: Secondary | ICD-10-CM | POA: Insufficient documentation

## 2012-06-17 LAB — URINALYSIS, ROUTINE W REFLEX MICROSCOPIC
Bilirubin Urine: NEGATIVE
Hgb urine dipstick: NEGATIVE
Ketones, ur: NEGATIVE mg/dL
Nitrite: NEGATIVE
Specific Gravity, Urine: 1.01 (ref 1.005–1.030)
Urobilinogen, UA: 0.2 mg/dL (ref 0.0–1.0)
pH: 5.5 (ref 5.0–8.0)

## 2012-06-17 LAB — CBC WITH DIFFERENTIAL/PLATELET
Eosinophils Relative: 1 % (ref 0–5)
Hemoglobin: 11.2 g/dL — ABNORMAL LOW (ref 12.0–15.0)
Lymphocytes Relative: 24 % (ref 12–46)
Lymphs Abs: 1.8 10*3/uL (ref 0.7–4.0)
MCV: 75.6 fL — ABNORMAL LOW (ref 78.0–100.0)
Monocytes Relative: 8 % (ref 3–12)
Neutrophils Relative %: 68 % (ref 43–77)
Platelets: 347 10*3/uL (ref 150–400)
RBC: 4.64 MIL/uL (ref 3.87–5.11)
WBC: 7.6 10*3/uL (ref 4.0–10.5)

## 2012-06-17 LAB — COMPREHENSIVE METABOLIC PANEL
AST: 50 U/L — ABNORMAL HIGH (ref 0–37)
Albumin: 3.7 g/dL (ref 3.5–5.2)
BUN: 6 mg/dL (ref 6–23)
Calcium: 9.8 mg/dL (ref 8.4–10.5)
Chloride: 100 mEq/L (ref 96–112)
Creatinine, Ser: 0.54 mg/dL (ref 0.50–1.10)
Total Bilirubin: 0.2 mg/dL — ABNORMAL LOW (ref 0.3–1.2)

## 2012-06-17 LAB — POCT PREGNANCY, URINE: Preg Test, Ur: NEGATIVE

## 2012-06-17 MED ORDER — LOPERAMIDE HCL 2 MG PO CAPS
4.0000 mg | ORAL_CAPSULE | Freq: Once | ORAL | Status: AC
  Administered 2012-06-18: 4 mg via ORAL
  Filled 2012-06-17: qty 2

## 2012-06-17 NOTE — MAU Note (Signed)
Pt reports diarrhea x 2 days, reports x 10 today. Also reports her stomach "feels hard". LMP 06/03/2012

## 2012-06-17 NOTE — MAU Provider Note (Signed)
Chief Complaint: Diarrhea and Abdominal Pain  First Provider Initiated Contact with Patient 06/17/12 2325     SUBJECTIVE HPI: [name] is a 42 y.o. G49P1001 female who presents with diarrhea x 2 days and intermittent upper abd pain and tightness. Pain precedes episode of diarrhea, then resolves.  Patient's last menstrual period was 06/03/2012. Diarrhea is loose to watery, no blood. Denies nausea, vomiting, low abd pain , fever, chills, sick contacts. Has had decreased appetite x 2 days, but is able to eat. No relationship btw eating and pain.    History reviewed. No pertinent past medical history. OB History   Grav Para Term Preterm Abortions TAB SAB Ect Mult Living   1 1 1       1      # Outc Date GA Lbr Len/2nd Wgt Sex Del Anes PTL Lv   1 TRM     F    Yes     Past Surgical History  Procedure Laterality Date  . Dilation and curettage of uterus    . Hysteroscopy with resectoscope  2011   History   Social History  . Marital Status: Married    Spouse Name: N/A    Number of Children: N/A  . Years of Education: N/A   Occupational History  . Not on file.   Social History Main Topics  . Smoking status: Never Smoker   . Smokeless tobacco: Not on file  . Alcohol Use: No  . Drug Use: No  . Sexually Active: Yes   Other Topics Concern  . Not on file   Social History Narrative  . No narrative on file   No current facility-administered medications on file prior to encounter.   Current Outpatient Prescriptions on File Prior to Encounter  Medication Sig Dispense Refill  . ferrous sulfate 325 (65 FE) MG tablet Take 1 tablet (325 mg total) by mouth daily.  30 tablet  0   No Known Allergies  ROS: Pertinent items in HPI  OBJECTIVE Blood pressure 126/85, pulse 99, temperature 100.2 F (37.9 C), temperature source Oral, resp. rate 20, height 5' 5.5" (1.664 m), weight 91.173 kg (201 lb), last menstrual period 06/03/2012, SpO2 100.00%. GENERAL: Well-developed, well-nourished  female in no acute distress.  HEENT: Normocephalic HEART: normal rate RESP: normal effort ABDOMEN: Soft, non-tender. Increased x 4. Neg Murphy's sign.  EXTREMITIES: Nontender, no edema NEURO: Alert and oriented SPECULUM EXAM: Deferred  LAB RESULTS Results for orders placed during the hospital encounter of 06/17/12 (from the past 24 hour(s))  URINALYSIS, ROUTINE W REFLEX MICROSCOPIC     Status: None   Collection Time    06/17/12  9:20 PM      Result Value Range   Color, Urine YELLOW  YELLOW   APPearance CLEAR  CLEAR   Specific Gravity, Urine 1.010  1.005 - 1.030   pH 5.5  5.0 - 8.0   Glucose, UA NEGATIVE  NEGATIVE mg/dL   Hgb urine dipstick NEGATIVE  NEGATIVE   Bilirubin Urine NEGATIVE  NEGATIVE   Ketones, ur NEGATIVE  NEGATIVE mg/dL   Protein, ur NEGATIVE  NEGATIVE mg/dL   Urobilinogen, UA 0.2  0.0 - 1.0 mg/dL   Nitrite NEGATIVE  NEGATIVE   Leukocytes, UA NEGATIVE  NEGATIVE  POCT PREGNANCY, URINE     Status: None   Collection Time    06/17/12  9:46 PM      Result Value Range   Preg Test, Ur NEGATIVE  NEGATIVE  COMPREHENSIVE METABOLIC PANEL  Status: Abnormal   Collection Time    06/17/12 10:35 PM      Result Value Range   Sodium 135  135 - 145 mEq/L   Potassium 4.2  3.5 - 5.1 mEq/L   Chloride 100  96 - 112 mEq/L   CO2 24  19 - 32 mEq/L   Glucose, Bld 171 (*) 70 - 99 mg/dL   BUN 6  6 - 23 mg/dL   Creatinine, Ser 1.61  0.50 - 1.10 mg/dL   Calcium 9.8  8.4 - 09.6 mg/dL   Total Protein 7.4  6.0 - 8.3 g/dL   Albumin 3.7  3.5 - 5.2 g/dL   AST 50 (*) 0 - 37 U/L   ALT 30  0 - 35 U/L   Alkaline Phosphatase 99  39 - 117 U/L   Total Bilirubin 0.2 (*) 0.3 - 1.2 mg/dL   GFR calc non Af Amer >90  >90 mL/min   GFR calc Af Amer >90  >90 mL/min  CBC WITH DIFFERENTIAL     Status: Abnormal   Collection Time    06/17/12 10:35 PM      Result Value Range   WBC 7.6  4.0 - 10.5 K/uL   RBC 4.64  3.87 - 5.11 MIL/uL   Hemoglobin 11.2 (*) 12.0 - 15.0 g/dL   HCT 04.5 (*) 40.9 - 81.1 %    MCV 75.6 (*) 78.0 - 100.0 fL   MCH 24.1 (*) 26.0 - 34.0 pg   MCHC 31.9  30.0 - 36.0 g/dL   RDW 91.4  78.2 - 95.6 %   Platelets 347  150 - 400 K/uL   Neutrophils Relative % 68  43 - 77 %   Neutro Abs 5.2  1.7 - 7.7 K/uL   Lymphocytes Relative 24  12 - 46 %   Lymphs Abs 1.8  0.7 - 4.0 K/uL   Monocytes Relative 8  3 - 12 %   Monocytes Absolute 0.6  0.1 - 1.0 K/uL   Eosinophils Relative 1  0 - 5 %   Eosinophils Absolute 0.0  0.0 - 0.7 K/uL   Basophils Relative 0  0 - 1 %   Basophils Absolute 0.0  0.0 - 0.1 K/uL    IMAGING No results found.  MAU COURSE Imodium given.   ASSESSMENT 1. Viral gastroenteritis   2. Elevated AST (SGOT)    PLAN Discharge home in stable condition.     Follow-up Information   Follow up with Primary care provider. (for reevaluation of elevated AST (liver test) as needed if symptoms worsen.)        Medication List    TAKE these medications       acetaminophen 325 MG tablet  Commonly known as:  TYLENOL  Take 650 mg by mouth every 6 (six) hours as needed for pain.     famotidine 40 MG/5ML suspension  Commonly known as:  PEPCID  Take 20 mg by mouth daily.     ferrous sulfate 325 (65 FE) MG tablet  Take 1 tablet (325 mg total) by mouth daily.     ibuprofen 200 MG tablet  Commonly known as:  ADVIL,MOTRIN  Take 200 mg by mouth every 6 (six) hours as needed for pain.     loperamide 2 MG tablet  Commonly known as:  IMODIUM A-D  Take 1 tablet (2 mg total) by mouth 4 (four) times daily as needed for diarrhea or loose stools.        [place]  Katrinka Blazing, CNM 06/18/2012  12:13 AM

## 2012-06-18 DIAGNOSIS — A088 Other specified intestinal infections: Secondary | ICD-10-CM

## 2012-06-18 MED ORDER — LOPERAMIDE HCL 2 MG PO TABS: 2.0000 mg | ORAL_TABLET | Freq: Four times a day (QID) | ORAL | Status: DC | PRN

## 2012-06-19 ENCOUNTER — Encounter (HOSPITAL_COMMUNITY): Payer: Self-pay | Admitting: Advanced Practice Midwife

## 2012-06-19 NOTE — MAU Provider Note (Signed)
Attestation of Attending Supervision of Advanced Practitioner (PA/CNM/NP): Evaluation and management procedures were performed by the Advanced Practitioner under my supervision and collaboration.  I have reviewed the Advanced Practitioner's note and chart, and I agree with the management and plan.  Katha Kuehne, MD, FACOG Attending Obstetrician & Gynecologist Faculty Practice, [facility]  

## 2013-06-20 ENCOUNTER — Inpatient Hospital Stay (HOSPITAL_COMMUNITY)
Admission: AD | Admit: 2013-06-20 | Discharge: 2013-06-20 | Disposition: A | Discharge status: Home / Self Care | Payer: BC Managed Care – PPO | Source: Ambulatory Visit | Attending: Obstetrics & Gynecology | Admitting: Obstetrics & Gynecology

## 2013-06-20 ENCOUNTER — Encounter (HOSPITAL_COMMUNITY): Payer: Self-pay | Admitting: *Deleted

## 2013-06-20 DIAGNOSIS — B373 Candidiasis of vulva and vagina: Secondary | ICD-10-CM

## 2013-06-20 DIAGNOSIS — B3731 Acute candidiasis of vulva and vagina: Secondary | ICD-10-CM

## 2013-06-20 DIAGNOSIS — R7309 Other abnormal glucose: Secondary | ICD-10-CM | POA: Insufficient documentation

## 2013-06-20 LAB — URINALYSIS, ROUTINE W REFLEX MICROSCOPIC
BILIRUBIN URINE: NEGATIVE
Glucose, UA: >1000 mg/dL — AB
Hgb urine dipstick: NEGATIVE
KETONES UR: NEGATIVE mg/dL
Leukocytes, UA: NEGATIVE
NITRITE: NEGATIVE
PROTEIN: NEGATIVE mg/dL
SPECIFIC GRAVITY, URINE: 1.015 (ref 1.005–1.030)
UROBILINOGEN UA: 0.2 mg/dL (ref 0.0–1.0)
pH: 6 (ref 5.0–8.0)

## 2013-06-20 LAB — URINE MICROSCOPIC-ADD ON: RBC / HPF: NONE SEEN RBC/hpf (ref <3)

## 2013-06-20 LAB — POCT PREGNANCY, URINE: PREG TEST UR: NEGATIVE

## 2013-06-20 LAB — WET PREP, GENITAL
Clue Cells Wet Prep HPF POC: NONE SEEN
TRICH WET PREP: NONE SEEN

## 2013-06-20 LAB — GLUCOSE, CAPILLARY: Glucose-Capillary: 233 mg/dL — ABNORMAL HIGH (ref 70–99)

## 2013-06-20 MED ORDER — FLUCONAZOLE 150 MG PO TABS
150.0000 mg | ORAL_TABLET | Freq: Once | ORAL | Status: AC
  Administered 2013-06-20: 150 mg via ORAL
  Filled 2013-06-20: qty 1

## 2013-06-20 NOTE — Discharge Instructions (Signed)
Vulvovaginitis Candidisica (Candidal Vulvovaginitis) La vulvovaginitis candidisica es una infeccin de la vagina y la vulva. La vulva es la piel que rodea la abertura de la vagina. Puede causar picazn y [place] de y alrededor de la vagina.  CUIDADOS EN EL HOGAR  Slo tome medicamentos como lo indique su mdico.  No mantenga relaciones sexuales hasta que la infeccin haya curado o segn le indique el mdico.  Practique sexo seguro.  Informe a su compaero sexual acerca de su infeccin.  No tome duchas vaginales ni use tampones.  Use ropa interior de algodn. No utilice pantalones ni pantimedias ajustados.  Coma yogur. Esto puede ayudar a tratar y prevenir las infecciones por cndida. SOLICITE AYUDA DE INMEDIATO SI:   Tiene fiebre.  Los problemas empeoran durante el tratamiento, o si no mejora luego de [address].  Tiene malestar, irritacin, o picazn en la zona de la vagina o la vulva.  Siente dolor en al [employer]  Comienza a sentir dolor abdominal. ASEGRESE DE QUE:  Comprende estas instrucciones.  Controlar su enfermedad.  Solicitar ayuda de inmediato si no mejora o empeora. Document Released: 01/27/2010 Document Revised: 03/19/2011 [facility] Patient Information 2014 [place], [place]. Diabetes, Type 2, Am I At Risk? Diabetes is a lasting (chronic) disease. In type 2 diabetes, the pancreas does not make enough insulin, and the body does not respond normally to the insulin that is made. This type of diabetes was also previously called adult onset diabetes. About 90% of all those who have diabetes have type 2. It usually occurs after the age of 55, but can occur at any age.  People develop type 2 diabetes because they do not use insulin properly. Eventually, the pancreas cannot make enough insulin for the body's needs. Over time, the amount of glucose (sugar) in the blood increases. RISK FACTORS  Overweight  the more weight you have, the more  resistant your cells become to insulin.  Family history  you are more likely to get diabetes if a parent or sibling has diabetes.  Race certain races get diabetes more.  African Americans.  American Indians.  Asian Americans.  Hispanics.  Pacific Islander.  Inactive exercise helps control weight and helps your cells be more sensitive to insulin.  Gestational diabetes  some women develop diabetes while they are pregnant. This goes away when they deliver. However, they are 50-60% more likely to develop type 2 diabetes at a later time.  Having a baby over 9 pounds  a sign that you may have had gestational diabetes.  Age the risk of diabetes goes up as you get older, especially after age 36.  High blood pressure (hypertension). SYMPTOMS Many people have no signs or symptoms. Symptoms can be so mild that you might not even notice them. Some of these signs are:  Increased thirst.  Increased hunger.  Tiredness (fatigue).  Increased urination, especially at night.  Weight loss.  Blurred vision.  Sores that do not heal. WHO SHOULD BE TESTED?  Anyone 45 years or older, especially if overweight, should consider getting tested.  If you are younger than 45, overweight, and have one or more of the risk factors, you should consider getting tested. DIAGNOSIS  Fasting blood glucose (FBS). Usually, 2 are done.  FBS 101-125 mg/dl is considered pre-diabetes.  FBS 126 mg/dl or greater is considered diabetes.  2 hour Oral Glucose Tolerance Test (OGTT). This test is preformed by first having you not eat or drink for several hours. You are then  given something sweet to drink and your blood glucose is measured fasting, at one hour and 2 hours. This test tells how well you are able to handle sugars or carbohydrates.  Fasting: 60-100 mg/dl.  1 hour: less than 200 mg/dl.  2 hours: less than 140 mg/dl.  A1c A1c is a blood glucose test that gives and average of your blood glucose over  3 months. It is the accepted method to use to diagnose diabetes.  A1c 5.7-6.4% is considered pre-diabetes.  A1c 6.5% or greater is considered diabetes. WHAT DOES IT MEAN TO HAVE PRE-DIABETES? Pre-diabetes means you are at risk for getting type 2 diabetes. Your blood glucose is higher than normal, but not yet high enough to diagnose diabetes. The good news is, if you have pre-diabetes you can reduce the risk of getting diabetes and even return to normal blood glucose levels. With modest weight loss and moderate physical activity, you can delay or prevent type 2 diabetes.  PREVENTION You cannot do anything about race, age or family history, but you can lower your chances of getting diabetes. You can:   Exercise regularly and be active.  Reduce fat and calorie intake.  Make wise food choices as much as you can.  Reduce your intake of salt and alcohol.  Maintain a reasonable weight.  Keep blood pressure in an acceptable range. Take medication if needed.  Not smoke.  Maintain an acceptable cholesterol level (HDL, LDL, Triglycerides). Take medication if needed. DOING MY PART: GETTING STARTED Making big changes in your life is hard, especially if you are faced with more than one change. You can make it easier by taking these steps:  Make a plan to change behavior.  Decide exactly what you will do and when you will do it.  Plan what you need to get ready.  Think about what might prevent you from reaching your goals.  Find family and friends who will support and encourage you.  Decide how you will reward yourself when you do what you have planned.  Your doctor, dietitian, or counselor can help you make a plan. HERE ARE SOME OF THE AREAS YOU MAY WISH TO CHANGE TO REDUCE YOUR RISK OF DIABETES. If you are overweight or obese, choose sensible ways to get in shape. Even small amounts of weight loss, like 5-10 pounds, can help reduce the effects of insulin resistance and help blood glucose  control. Diet  Avoid crash diets. Instead, eat less of the foods you usually have. Limit the amount of fat you eat.  Increase your physical activity. Aim for at least 30 minutes of exercise most days of the week.  Set a reasonable weight-loss goal, such as losing 1 pound a week. Aim for a long-term goal of losing 5-7% of your total body weight.  Make wise food choices most of the time.  What you eat has a big impact on your health. By making wise food choices, you can help control your body weight, blood pressure, and cholesterol.  Take a hard look at the serving sizes of the foods you eat. Reduce serving sizes of meat, desserts, and foods high in fat. Increase your intake of fruits and vegetables.  Limit your fat intake to about 25% of your total calories. For example, if your food choices add up to about 2,000 calories a day, try to eat no more than 56 grams of fat. Your caregiver or a dietitian can help you figure out how much fat to have. You can  check food labels for fat content too.  You may also want to reduce the number of calories you have each day.  Keep a food log. Write down what you eat, how much you eat, and anything else that helps keep you on track.  When you meet your goal, reward yourself with a nonfood item or activity. Exercise  Be physically active every day.  Keep and exercise log. Write down what exercise you did, for how long, and anything else that keeps you on track.  Regular exercise (like brisk walking) tackles several risk factors at once. It helps you lose weight, it keeps your cholesterol and blood pressure under control, and it helps your body use insulin. People who are physically active for 30 minutes a day, 5 days a week, reduced their risk of type 2 diabetes. If you are not very active, you should start slowly at first. Talk with your caregiver first about what kinds of exercise would be safe for you. Make a plan to increase your activity level with the  goal of being active for at least 30 minutes a day, most days of the week.  Choose activities you enjoy. Here are some ways to work extra activity into your daily routine:  Take the stairs rather than an elevator or escalator.  Park at the far end of the lot and walk.  Get off the bus a few stops early and walk the rest of the way.  Walk or bicycle instead of drive whenever you can. Medications Some people need medication to help control their blood pressure or cholesterol levels. If you do, take your medicines as directed. Ask your caregiver whether there are any medicines you can take to prevent type 2 diabetes. Document Released: 12/28/2002 Document Revised: 03/19/2011 Document Reviewed: 09/22/2008 [facility] Patient Information 2014 [place].

## 2013-06-20 NOTE — Progress Notes (Signed)
Joseph BerkshireJulie Ethier PA in to discuss test results and d/c plan. Written and verbal d/c instructions given and understanding voiced.

## 2013-06-20 NOTE — MAU Provider Note (Signed)
History     CSN: [id number]  Arrival date and time: 06/20/13 0023   First Provider Initiated Contact with Patient 06/20/13 0245      Chief Complaint  Patient presents with  . Vaginal Discharge   HPI Ms. [name] is a 43 y.o. G1P1001 who presents to MAU today with complaint of thick, white discharge with itching. She states that she has been itching so much that she has had bleeding. She states mild pelvic pain. She denies N/V/D or fever.   OB History   Grav Para Term Preterm Abortions TAB SAB Ect Mult Living   1 1 1       1       History reviewed. No pertinent past medical history.  Past Surgical History  Procedure Laterality Date  . Dilation and curettage of uterus    . Hysteroscopy with resectoscope  2011    Family History  Problem Relation Age of Onset  . Alcohol abuse Neg Hx     History  Substance Use Topics  . Smoking status: Never Smoker   . Smokeless tobacco: Not on file  . Alcohol Use: No    Allergies: No Known Allergies  Prescriptions prior to admission  Medication Sig Dispense Refill  . acetaminophen (TYLENOL) 325 MG tablet Take 650 mg by mouth every 6 (six) hours as needed for pain.      . calcium carbonate (TUMS - DOSED IN MG ELEMENTAL CALCIUM) 500 MG chewable tablet Chew 2 tablets by mouth daily.      . famotidine (PEPCID) 40 MG/5ML suspension Take 20 mg by mouth daily.      . ferrous sulfate 325 (65 FE) MG tablet Take 1 tablet (325 mg total) by mouth daily.  30 tablet  0  . ibuprofen (ADVIL,MOTRIN) 200 MG tablet Take 200 mg by mouth every 6 (six) hours as needed for pain.      Marland Kitchen. loperamide (IMODIUM A-D) 2 MG tablet Take 1 tablet (2 mg total) by mouth 4 (four) times daily as needed for diarrhea or loose stools.  30 tablet  0    Review of Systems  Constitutional: Negative for fever and malaise/fatigue.  Gastrointestinal: Positive for abdominal pain. Negative for nausea and vomiting.  Genitourinary:       + vaginal discharge Neg - vaginal  bleeding   Physical Exam   Blood pressure 142/81, pulse 102, temperature 99.2 F (37.3 C), resp. rate 20, height 5\' 5"  (1.651 m), weight 206 lb (93.441 kg), last menstrual period 05/30/2013, SpO2 100.00%.  Physical Exam  Constitutional: She is oriented to person, place, and time. She appears well-developed and well-nourished. No distress.  HENT:  Head: Normocephalic.  Cardiovascular: Normal rate.   Respiratory: Effort normal.  GI: Soft. She exhibits no distension and no mass. There is tenderness (mild tenderness to palpation of the lower abdomen bilaterally). There is no rebound and no guarding.  Genitourinary: Uterus is not enlarged and not tender. Cervix exhibits no motion tenderness, no discharge and no friability. Right adnexum displays no mass and no tenderness. Left adnexum displays no mass and no tenderness. No bleeding around the vagina. Vaginal discharge (small amount of thick, white discharge) found.  Neurological: She is alert and oriented to person, place, and time.  Skin: Skin is warm and dry. No erythema.  Psychiatric: She has a normal mood and affect.   Results for orders placed during the hospital encounter of 06/20/13 (from the past 24 hour(s))  URINALYSIS, ROUTINE W REFLEX MICROSCOPIC  Status: Abnormal   Collection Time    06/20/13 12:50 AM      Result Value Ref Range   Color, Urine YELLOW  YELLOW   APPearance CLEAR  CLEAR   Specific Gravity, Urine 1.015  1.005 - 1.030   pH 6.0  5.0 - 8.0   Glucose, UA >1000 (*) NEGATIVE mg/dL   Hgb urine dipstick NEGATIVE  NEGATIVE   Bilirubin Urine NEGATIVE  NEGATIVE   Ketones, ur NEGATIVE  NEGATIVE mg/dL   Protein, ur NEGATIVE  NEGATIVE mg/dL   Urobilinogen, UA 0.2  0.0 - 1.0 mg/dL   Nitrite NEGATIVE  NEGATIVE   Leukocytes, UA NEGATIVE  NEGATIVE  URINE MICROSCOPIC-ADD ON     Status: None   Collection Time    06/20/13 12:50 AM      Result Value Ref Range   Squamous Epithelial / LPF RARE  RARE   WBC, UA 3-6  <3 WBC/hpf    RBC / HPF    <3 RBC/hpf   Value: NO FORMED ELEMENTS SEEN ON URINE MICROSCOPIC EXAMINATION   Bacteria, UA RARE  RARE   Urine-Other RARE YEAST    POCT PREGNANCY, URINE     Status: None   Collection Time    06/20/13  1:23 AM      Result Value Ref Range   Preg Test, Ur NEGATIVE  NEGATIVE  WET PREP, GENITAL     Status: Abnormal   Collection Time    06/20/13  3:00 AM      Result Value Ref Range   Yeast Wet Prep HPF POC FEW (*) NONE SEEN   Trich, Wet Prep NONE SEEN  NONE SEEN   Clue Cells Wet Prep HPF POC NONE SEEN  NONE SEEN   WBC, Wet Prep HPF POC MANY (*) NONE SEEN  GLUCOSE, CAPILLARY     Status: Abnormal   Collection Time    06/20/13  3:06 AM      Result Value Ref Range   Glucose-Capillary 233 (*) 70 - 99 mg/dL    MAU Course  Procedures None  MDM UPT - negative UA, wet prep and GC/Chlamydia today CBG today 150 mg Diflucan given in MAU  Assessment and Plan  A: Yeast vaginitis Hyperglycemia  P: Discharge home Patient treated in MAU today Patient given information for PCP. Advised to establish care ASAP Patient may return to MAU as needed or if her condition were to change or worsen  Freddi StarrJulie N Ethier, PA-C  06/20/2013, 3:35 AM

## 2013-06-20 NOTE — MAU Note (Signed)
Having white vag d/c for 3 days. Itching so much that area is bleeding

## 2013-06-20 NOTE — MAU Provider Note (Signed)
Attestation of Attending Supervision of Advanced Practitioner (PA/CNM/NP): Evaluation and management procedures were performed by the Advanced Practitioner under my supervision and collaboration.  I have reviewed the Advanced Practitioner's note and chart, and I agree with the management and plan.  Violetta Lavalle, MD, FACOG Attending Obstetrician & Gynecologist Faculty Practice, [facility]  

## 2013-06-22 LAB — GC/CHLAMYDIA PROBE AMP
CT PROBE, AMP APTIMA: NEGATIVE
GC PROBE AMP APTIMA: NEGATIVE

## 2013-11-09 ENCOUNTER — Encounter (HOSPITAL_COMMUNITY): Payer: Self-pay | Admitting: *Deleted

## 2015-07-28 ENCOUNTER — Encounter (HOSPITAL_COMMUNITY): Payer: Self-pay | Admitting: *Deleted

## 2015-07-28 ENCOUNTER — Ambulatory Visit (HOSPITAL_COMMUNITY)
Admission: EM | Admit: 2015-07-28 | Discharge: 2015-07-28 | Disposition: A | Discharge status: Home / Self Care | Payer: Medicaid Other | Attending: Emergency Medicine | Admitting: Emergency Medicine

## 2015-07-28 DIAGNOSIS — K029 Dental caries, unspecified: Secondary | ICD-10-CM

## 2015-07-28 DIAGNOSIS — K0889 Other specified disorders of teeth and supporting structures: Secondary | ICD-10-CM

## 2015-07-28 DIAGNOSIS — K047 Periapical abscess without sinus: Principal | ICD-10-CM

## 2015-07-28 LAB — POCT PREGNANCY, URINE: Preg Test, Ur: NEGATIVE

## 2015-07-28 MED ORDER — AMOXICILLIN 500 MG PO CAPS: 500.0000 mg | ORAL_CAPSULE | Freq: Two times a day (BID) | ORAL | Status: DC

## 2015-07-28 MED ORDER — DICLOFENAC SODIUM 75 MG PO TBEC: 75.0000 mg | DELAYED_RELEASE_TABLET | Freq: Two times a day (BID) | ORAL | Status: DC

## 2015-07-28 NOTE — ED Provider Notes (Signed)
CSN: [id number]     Arrival date & time 07/28/15  1924 History   First MD Initiated Contact with Patient 07/28/15 2028     Chief Complaint  Patient presents with  . Dental Pain   (Consider location/radiation/quality/duration/timing/severity/associated sxs/prior Treatment) HPI Comments: Patient is a 45 yo who presents with dental pain. Onset x 2 days. Right sided lower. No fever or chills.   Patient is a 45 y.o. female presenting with tooth pain. The history is provided by the patient. The history is limited by a language barrier. A language interpreter was used (Friend).  Dental Pain Associated symptoms: no fever     History reviewed. No pertinent past medical history. Past Surgical History  Procedure Laterality Date  . Dilation and curettage of uterus    . Hysteroscopy with resectoscope  2011   Family History  Problem Relation Age of Onset  . Alcohol abuse Neg Hx    Social History  Substance Use Topics  . Smoking status: Never Smoker   . Smokeless tobacco: None  . Alcohol Use: No   OB History    Gravida Para Term Preterm AB TAB SAB Ectopic Multiple Living   Review of Systems  Constitutional: Negative for fever, chills and fatigue.    Allergies  Review of patient's allergies indicates no known allergies.  Home Medications   Prior to Admission medications   Medication Sig Start Date End Date Taking? Authorizing Provider  acetaminophen (TYLENOL) 325 MG tablet Take 650 mg by mouth every 6 (six) hours as needed for pain.    Historical Provider, MD  amoxicillin (AMOXIL) 500 MG capsule Take 1 capsule (500 mg total) by mouth 2 (two) times daily. 07/28/15   Riki Sheer, PA-C  calcium carbonate (TUMS - DOSED IN MG ELEMENTAL CALCIUM) 500 MG chewable tablet Chew 2 tablets by mouth daily.    Historical Provider, MD  diclofenac (VOLTAREN) 75 MG EC tablet Take 1 tablet (75 mg total) by mouth 2 (two) times daily. 07/28/15   Riki Sheer, PA-C  famotidine  (PEPCID) 40 MG/5ML suspension Take 20 mg by mouth daily.    Historical Provider, MD  ferrous sulfate 325 (65 FE) MG tablet Take 1 tablet (325 mg total) by mouth daily. 10/30/11   Marisa Severin, MD  ibuprofen (ADVIL,MOTRIN) 200 MG tablet Take 200 mg by mouth every 6 (six) hours as needed for pain.    Historical Provider, MD  loperamide (IMODIUM A-D) 2 MG tablet Take 1 tablet (2 mg total) by mouth 4 (four) times daily as needed for diarrhea or loose stools. 06/18/12   Dorathy Kinsman, CNM   Meds Ordered and Administered this Visit  Medications - No data to display  BP 152/92 mmHg  Pulse 83  Temp(Src) 98.8 F (37.1 C) (Oral)  Resp 16  SpO2 100% No data found.   Physical Exam  Constitutional: She is oriented to person, place, and time. She appears well-developed and well-nourished. No distress.  HENT:  Head: Normocephalic and atraumatic.  Mouth/Throat: Oropharynx is clear and moist. No oropharyngeal exudate.  Right lower gum with mild erythema and swelling without frank abscess, dental carries are noted  Neck: Normal range of motion.  Lymphadenopathy:    She has no cervical adenopathy.  Neurological: She is alert and oriented to person, place, and time.  Skin: Skin is warm and dry. She is not diaphoretic.  Psychiatric: Her behavior is normal.  Nursing  note and vitals reviewed.   ED Course  Procedures (including critical care time)  Labs Review Labs Reviewed  POCT PREGNANCY, URINE    Imaging Review No results found.   Visual Acuity Review  Right Eye Distance:   Left Eye Distance:   Bilateral Distance:    Right Eye Near:   Left Eye Near:    Bilateral Near:         MDM   1. Infected dental carries   2. Pain, dental    1. No frank abscess. Treat with Amox and NSAIDs. If worsens suggest f/u with Dentist. They have one in mind. If emergent needs f/u in the ED.     Riki SheerMichelle G Breindel Collier, PA-C 07/28/15 2107

## 2015-07-28 NOTE — ED Notes (Signed)
Pt  Has  A  Toothache    X  2  Days  r  Lower side   Has  nt  Seen a  Dentist     She is  Also  Late   on her period

## 2015-07-28 NOTE — Discharge Instructions (Signed)
Dental Caries  You most likely have a little dental infection. Take all of your antibiotics. The Diclofenac will help with pain and swelling. F/U with dentist if needed.     Dental caries is tooth decay. This decay can cause a hole in teeth (cavity) that can get bigger and deeper over time. HOME CARE  Brush and floss your teeth. Do this at least two times a day.  Use a fluoride toothpaste.  Use a mouth rinse if told by your dentist or doctor.  Eat less sugary and starchy foods. Drink less sugary drinks.  Avoid snacking often on sugary and starchy foods. Avoid sipping often on sugary drinks.  Keep regular checkups and cleanings with your dentist.  Use fluoride supplements if told by your dentist or doctor.  Allow fluoride to be applied to teeth if told by your dentist or doctor.   This information is not intended to replace advice given to you by your health care provider. Make sure you discuss any questions you have with your health care provider.   Document Released: 10/04/2007 Document Revised: 01/15/2014 Document Reviewed: 12/28/2011 Elsevier Interactive Patient Education [employer]2016 Elsevier Inc.

## 2016-01-09 NOTE — L&D Delivery Note (Addendum)
Operative Delivery Note At 5:43 PM a viable female was delivered via .  Presentation: vertex; Position: Left,, Occiput,, Posterior; Station: +3. VAVD. Called to delivery room for fetal bradycardia Verbal consent: obtained from patient.  Risks and benefits discussed in detail.  Risks include, but are not limited to the risks of anesthesia, bleeding, infection, damage to maternal tissues, fetal cephalhematoma.  There is also the risk of inability to effect vaginal delivery of the head, or shoulder dystocia that cannot be resolved by established maneuvers, leading to the need for emergency cesarean section. Interpreter present in room to assist  APGAR: 3, 7; weight  .   Placenta status: , .  Delivered spontaneous, normal Cord:  with the following complications: .  Cord pH: 7.11 Nuchal cord X2 reduced Anesthesia:   Instruments: Kiwi Episiotomy:   Lacerations:   Suture Repair: none Est. Blood Loss (mL):  300  Mom to postpartum.  Baby to Couplet care / Skin to Skin.  Scheryl DarterJames Talyssa Gibas 12/14/2016, 5:54 PM

## 2016-07-07 ENCOUNTER — Encounter (HOSPITAL_COMMUNITY): Payer: Self-pay

## 2016-07-07 ENCOUNTER — Inpatient Hospital Stay (HOSPITAL_COMMUNITY)
Admission: AD | Admit: 2016-07-07 | Discharge: 2016-07-07 | Disposition: A | Discharge status: Home / Self Care | Payer: Self-pay | Source: Ambulatory Visit | Attending: Obstetrics & Gynecology | Admitting: Obstetrics & Gynecology

## 2016-07-07 DIAGNOSIS — D509 Iron deficiency anemia, unspecified: Secondary | ICD-10-CM

## 2016-07-07 DIAGNOSIS — R109 Unspecified abdominal pain: Secondary | ICD-10-CM

## 2016-07-07 DIAGNOSIS — O169 Unspecified maternal hypertension, unspecified trimester: Secondary | ICD-10-CM

## 2016-07-07 DIAGNOSIS — K5900 Constipation, unspecified: Secondary | ICD-10-CM

## 2016-07-07 DIAGNOSIS — O26892 Other specified pregnancy related conditions, second trimester: Secondary | ICD-10-CM

## 2016-07-07 DIAGNOSIS — O26899 Other specified pregnancy related conditions, unspecified trimester: Secondary | ICD-10-CM

## 2016-07-07 DIAGNOSIS — O99019 Anemia complicating pregnancy, unspecified trimester: Secondary | ICD-10-CM

## 2016-07-07 DIAGNOSIS — O09522 Supervision of elderly multigravida, second trimester: Secondary | ICD-10-CM

## 2016-07-07 DIAGNOSIS — Z3A15 15 weeks gestation of pregnancy: Secondary | ICD-10-CM

## 2016-07-07 DIAGNOSIS — O99012 Anemia complicating pregnancy, second trimester: Secondary | ICD-10-CM

## 2016-07-07 HISTORY — DX: Essential (primary) hypertension: I10

## 2016-07-07 LAB — WET PREP, GENITAL
Clue Cells Wet Prep HPF POC: NONE SEEN
SPERM: NONE SEEN
TRICH WET PREP: NONE SEEN
WBC, Wet Prep HPF POC: NONE SEEN
Yeast Wet Prep HPF POC: NONE SEEN

## 2016-07-07 LAB — CBC
HEMATOCRIT: 30.7 % — AB (ref 36.0–46.0)
HEMOGLOBIN: 9.6 g/dL — AB (ref 12.0–15.0)
MCH: 20.2 pg — ABNORMAL LOW (ref 26.0–34.0)
MCHC: 31.3 g/dL (ref 30.0–36.0)
MCV: 64.6 fL — AB (ref 78.0–100.0)
Platelets: 358 10*3/uL (ref 150–400)
RBC: 4.75 MIL/uL (ref 3.87–5.11)
RDW: 18.8 % — AB (ref 11.5–15.5)
WBC: 9.1 10*3/uL (ref 4.0–10.5)

## 2016-07-07 LAB — OB RESULTS CONSOLE GC/CHLAMYDIA: GC PROBE AMP, GENITAL: NEGATIVE

## 2016-07-07 LAB — URINALYSIS, ROUTINE W REFLEX MICROSCOPIC
Bilirubin Urine: NEGATIVE
GLUCOSE, UA: 150 mg/dL — AB
HGB URINE DIPSTICK: NEGATIVE
Ketones, ur: NEGATIVE mg/dL
LEUKOCYTES UA: NEGATIVE
Nitrite: NEGATIVE
PH: 7 (ref 5.0–8.0)
Protein, ur: NEGATIVE mg/dL
Specific Gravity, Urine: 1.008 (ref 1.005–1.030)

## 2016-07-07 LAB — POCT PREGNANCY, URINE: Preg Test, Ur: POSITIVE — AB

## 2016-07-07 MED ORDER — POLYSACCHARIDE IRON COMPLEX 150 MG PO CAPS: 150.0000 mg | ORAL_CAPSULE | Freq: Every day | ORAL | 1 refills | Status: DC

## 2016-07-07 NOTE — MAU Note (Signed)
Since last night, low abd pain, couldn't sleep. No bleeding. [facility] and has to call Monday there to make follow up appointment.

## 2016-07-07 NOTE — MAU Provider Note (Signed)
History     CSN: [id number]  Arrival date and time: 07/07/16 [id number]   First Provider Initiated Contact with Patient 07/07/16 [phone]      Chief Complaint  Patient presents with  . Abdominal Pain   G2P1001 @11 .6 by unsure LMP here with LAP. Pain started last night. Describes as bilateral and intermittent. Has not tried anything for it. No VB or vaginal discharge. Reports dysuria throughout the pregnancy. No fevers. Endorses intermittent constipation with hard stools.    OB History    Gravida Para Term Preterm AB Living   2 1 1     1    SAB TAB Ectopic Multiple Live Births           1      Past Medical History:  Diagnosis Date  . Hypertension     Past Surgical History:  Procedure Laterality Date  . DILATION AND CURETTAGE OF UTERUS    . HYSTEROSCOPY WITH RESECTOSCOPE  2011    Family History  Problem Relation Age of Onset  . Alcohol abuse Neg Hx     Social History  Substance Use Topics  . Smoking status: Never Smoker  . Smokeless tobacco: Never Used  . Alcohol use No    Allergies: No Known Allergies  Prescriptions Prior to Admission  Medication Sig Dispense Refill Last Dose  . acetaminophen (TYLENOL) 325 MG tablet Take 650 mg by mouth every 6 (six) hours as needed for pain.   07/06/2016 at Unknown time  . calcium carbonate (TUMS - DOSED IN MG ELEMENTAL CALCIUM) 500 MG chewable tablet Chew 2 tablets by mouth daily.   Past Week at Unknown time  . Prenatal Vit-Fe Fumarate-FA (MULTIVITAMIN-PRENATAL) 27-0.8 MG TABS tablet Take 1 tablet by mouth daily at 12 noon.   07/06/2016 at Unknown time  . amoxicillin (AMOXIL) 500 MG capsule Take 1 capsule (500 mg total) by mouth 2 (two) times daily. 20 capsule 0   . diclofenac (VOLTAREN) 75 MG EC tablet Take 1 tablet (75 mg total) by mouth 2 (two) times daily. 30 tablet 0   . famotidine (PEPCID) 40 MG/5ML suspension Take 20 mg by mouth daily.   06/17/2012 at Unknown  . ferrous sulfate 325 (65 FE) MG tablet Take 1 tablet (325 mg total) by  mouth daily. 30 tablet 0 06/16/2012 at Unknown  . ibuprofen (ADVIL,MOTRIN) 200 MG tablet Take 200 mg by mouth every 6 (six) hours as needed for pain.   Past Week at Unknown  . loperamide (IMODIUM A-D) 2 MG tablet Take 1 tablet (2 mg total) by mouth 4 (four) times daily as needed for diarrhea or loose stools. 30 tablet 0     Review of Systems  Constitutional: Negative for fever.  Gastrointestinal: Positive for abdominal pain.  Genitourinary: Positive for dysuria. Negative for vaginal bleeding and vaginal discharge.   Physical Exam   Blood pressure 132/87, pulse 84, temperature 99 F (37.2 C), temperature source Oral, resp. rate 18, weight 92.1 kg (203 lb), last menstrual period 04/15/2016, SpO2 100 %.  Physical Exam  Nursing note and vitals reviewed. Constitutional: She is oriented to person, place, and time. She appears well-developed and well-nourished. No distress.  HENT:  Head: Normocephalic and atraumatic.  Neck: Normal range of motion.  Cardiovascular: Normal rate.   Respiratory: Effort normal.  GI: Soft. She exhibits no distension and no mass. There is no tenderness. There is no rebound and no guarding.  Musculoskeletal: Normal range of motion.  Neurological: She is alert and oriented to  person, place, and time.  Skin: Skin is warm and dry.  Psychiatric: She has a normal mood and affect.  FHT: 145 bpm  Limited bedside US: viable, active fetus, +cardiac activity, subj. nml AFV, HC [redacted]w[redacted]d  Results for orders placed or performed during the hospital encounter of 07/07/16 (from the past 24 hour(s))  Urinalysis, Routine w reflex microscopic     Status: Abnormal   Collection Time: 07/07/16  5:40 AM  Result Value Ref Range   Color, Urine STRAW (A) YELLOW   APPearance CLEAR CLEAR   Specific Gravity, Urine 1.008 1.005 - 1.030   pH 7.0 5.0 - 8.0   Glucose, UA 150 (A) NEGATIVE mg/dL   Hgb urine dipstick NEGATIVE NEGATIVE   Bilirubin Urine NEGATIVE NEGATIVE   Ketones, ur NEGATIVE  NEGATIVE mg/dL   Protein, ur NEGATIVE NEGATIVE mg/dL   Nitrite NEGATIVE NEGATIVE   Leukocytes, UA NEGATIVE NEGATIVE  Pregnancy, urine POC     Status: Abnormal   Collection Time: 07/07/16  5:58 AM  Result Value Ref Range   Preg Test, Ur POSITIVE (A) NEGATIVE  CBC     Status: Abnormal   Collection Time: 07/07/16  7:04 AM  Result Value Ref Range   WBC 9.1 4.0 - 10.5 K/uL   RBC 4.75 3.87 - 5.11 MIL/uL   Hemoglobin 9.6 (L) 12.0 - 15.0 g/dL   HCT 38.7 (L) 56.4 - 33.2 %   MCV 64.6 (L) 78.0 - 100.0 fL   MCH 20.2 (L) 26.0 - 34.0 pg   MCHC 31.3 30.0 - 36.0 g/dL   RDW 95.1 (H) 88.4 - 16.6 %   Platelets 358 150 - 400 K/uL  Wet prep, genital     Status: None   Collection Time: 07/07/16  7:30 AM  Result Value Ref Range   Yeast Wet Prep HPF POC NONE SEEN NONE SEEN   Trich, Wet Prep NONE SEEN NONE SEEN   Clue Cells Wet Prep HPF POC NONE SEEN NONE SEEN   WBC, Wet Prep HPF POC NONE SEEN NONE SEEN   Sperm NONE SEEN    MAU Course  Procedures  MDM Labs ordered and reviewed. No evidence of pelvic infection or UTI. Pain likely caused by constipation and/or physiologic to growing pregnancy. Will treat IDA. Stable for discharge home.  Assessment and Plan   1. [redacted] weeks gestation of pregnancy   2. Abdominal pain affecting pregnancy   3. Constipation, unspecified constipation type   4. Iron deficiency anemia during pregnancy    Discharge home Follow up with [facility] to start prenatal care Rx Niferex Miralax prn Increase water intake and dietary fiber Return precautions  Allergies as of 07/07/2016   No Known Allergies     Medication List    STOP taking these medications   amoxicillin 500 MG capsule Commonly known as:  AMOXIL   diclofenac 75 MG EC tablet Commonly known as:  VOLTAREN   famotidine 40 MG/5ML suspension Commonly known as:  PEPCID   ferrous sulfate 325 (65 FE) MG tablet   ibuprofen 200 MG tablet Commonly known as:  ADVIL,MOTRIN   loperamide 2 MG tablet Commonly known  as:  IMODIUM A-D     TAKE these medications   acetaminophen 325 MG tablet Commonly known as:  TYLENOL Take 650 mg by mouth every 6 (six) hours as needed for pain.   calcium carbonate 500 MG chewable tablet Commonly known as:  TUMS - dosed in mg elemental calcium Chew 2 tablets by mouth daily.  iron polysaccharides 150 MG capsule Commonly known as:  NIFEREX Take 1 capsule (150 mg total) by mouth daily.   multivitamin-prenatal 27-0.8 MG Tabs tablet Take 1 tablet by mouth daily at 12 noon.      Donette Larry, CNM 07/07/2016, 7:09 AM

## 2016-07-07 NOTE — Discharge Instructions (Signed)
Constipation, Adult Constipation is when a person:  Poops (has a bowel movement) fewer times in a week than normal.  Has a hard time pooping.  Has poop that is dry, hard, or bigger than normal.  Follow these instructions at home: Eating and drinking   Eat foods that have a lot of fiber, such as: ? Fresh fruits and vegetables. ? Whole grains. ? Beans.  Eat less of foods that are high in fat, low in fiber, or overly processed, such as: ? [place] fries. ? Hamburgers. ? Cookies. ? Candy. ? Soda.  Drink enough fluid to keep your pee (urine) clear or pale yellow. General instructions  Exercise regularly or as told by your doctor.  Go to the restroom when you feel like you need to poop. Do not hold it in.  Take over-the-counter and prescription medicines only as told by your doctor. These include any fiber supplements.  Do pelvic floor retraining exercises, such as: ? Doing deep breathing while relaxing your lower belly (abdomen). ? Relaxing your pelvic floor while pooping.  Watch your condition for any changes.  Keep all follow-up visits as told by your doctor. This is important. Contact a doctor if:  You have pain that gets worse.  You have a fever.  You have not pooped for 4 days.  You throw up (vomit).  You are not hungry.  You lose weight.  You are bleeding from the anus.  You have thin, pencil-like poop (stool). Get help right away if:  You have a fever, and your symptoms suddenly get worse.  You leak poop or have blood in your poop.  Your belly feels hard or bigger than normal (is bloated).  You have very bad belly pain.  You feel dizzy or you faint. This information is not intended to replace advice given to you by your health care provider. Make sure you discuss any questions you have with your health care provider. *You can use Miralax once daily for constipation-this can be bought over the counter  Document Released: 06/13/2007 Document  Revised: 07/15/2015 Document Reviewed: 06/15/2015 Elsevier Interactive Patient Education  2017 Elsevier Inc.  Abdominal Pain During Pregnancy Belly (abdominal) pain is common during pregnancy. Most of the time, it is not a serious problem. Other times, it can be a sign that something is wrong with the pregnancy. Always tell your doctor if you have belly pain. Follow these instructions at home: Monitor your belly pain for any changes. The following actions may help you feel better:  Do not have sex (intercourse) or put anything in your vagina until you feel better.  Rest until your pain stops.  Drink clear fluids if you feel sick to your stomach (nauseous). Do not eat solid food until you feel better.  Only take medicine as told by your doctor.  Keep all doctor visits as told.  Get help right away if:  You are bleeding, leaking fluid, or pieces of tissue come out of your vagina.  You have more pain or cramping.  You keep throwing up (vomiting).  You have pain when you pee (urinate) or have blood in your pee.  You have a fever.  You do not feel your baby moving as much.  You feel very weak or feel like passing out.  You have trouble breathing, with or without belly pain.  You have a very bad headache and belly pain.  You have fluid leaking from your vagina and belly pain.  You keep having watery poop (  diarrhea).  Your belly pain does not go away after resting, or the pain gets worse. This information is not intended to replace advice given to you by your health care provider. Make sure you discuss any questions you have with your health care provider. Document Released: 12/13/2008 Document Revised: 08/03/2015 Document Reviewed: 07/24/2012 Elsevier Interactive Patient Education  [employer]2018 Elsevier Inc.

## 2016-07-09 LAB — GC/CHLAMYDIA PROBE AMP (~~LOC~~) NOT AT ARMC
Chlamydia: NEGATIVE
Neisseria Gonorrhea: NEGATIVE

## 2016-08-09 ENCOUNTER — Other Ambulatory Visit (HOSPITAL_COMMUNITY): Payer: Self-pay | Admitting: Nurse Practitioner

## 2016-08-09 DIAGNOSIS — Z3689 Encounter for other specified antenatal screening: Secondary | ICD-10-CM

## 2016-08-09 DIAGNOSIS — O09522 Supervision of elderly multigravida, second trimester: Secondary | ICD-10-CM

## 2016-08-09 DIAGNOSIS — O24419 Gestational diabetes mellitus in pregnancy, unspecified control: Secondary | ICD-10-CM

## 2016-08-09 LAB — OB RESULTS CONSOLE GC/CHLAMYDIA
CHLAMYDIA, DNA PROBE: NEGATIVE
Gonorrhea: NEGATIVE

## 2016-08-09 LAB — OB RESULTS CONSOLE HEPATITIS B SURFACE ANTIGEN: HEP B S AG: NEGATIVE

## 2016-08-09 LAB — OB RESULTS CONSOLE VARICELLA ZOSTER ANTIBODY, IGG: Varicella: IMMUNE

## 2016-08-09 LAB — OB RESULTS CONSOLE RPR: RPR: NONREACTIVE

## 2016-08-09 LAB — OB RESULTS CONSOLE RUBELLA ANTIBODY, IGM
RUBELLA: IMMUNE
RUBELLA: IMMUNE
Rubella: NON-IMMUNE/NOT IMMUNE

## 2016-08-09 LAB — CYTOLOGY - PAP: PAP SMEAR: NEGATIVE

## 2016-08-09 LAB — OB RESULTS CONSOLE HIV ANTIBODY (ROUTINE TESTING)
HIV: NONREACTIVE
HIV: NONREACTIVE

## 2016-08-13 ENCOUNTER — Encounter: Payer: Self-pay | Admitting: *Deleted

## 2016-08-13 LAB — HEMOGLOBIN EVAL RFX ELECTROPHORESIS: HEMOGLOBIN EVALUATION: NORMAL

## 2016-08-16 ENCOUNTER — Encounter: Payer: Self-pay | Attending: Obstetrics and Gynecology | Admitting: *Deleted

## 2016-08-16 ENCOUNTER — Ambulatory Visit: Payer: Self-pay | Admitting: *Deleted

## 2016-08-16 DIAGNOSIS — R7302 Impaired glucose tolerance (oral): Secondary | ICD-10-CM | POA: Insufficient documentation

## 2016-08-16 DIAGNOSIS — R7309 Other abnormal glucose: Secondary | ICD-10-CM

## 2016-08-16 DIAGNOSIS — Z713 Dietary counseling and surveillance: Secondary | ICD-10-CM | POA: Insufficient documentation

## 2016-08-16 MED ORDER — GLYBURIDE 2.5 MG PO TABS: 2.5000 mg | ORAL_TABLET | Freq: Two times a day (BID) | ORAL | 3 refills | Status: DC

## 2016-08-16 NOTE — Progress Notes (Signed)
  Patient was seen on 08/16/2016 for Gestational Diabetes self-management. Patient here with [place] interpretor. Patient states no education, unable to read or write. Written information provided in English with repetitive teaching of use of meter and recording results on Log Sheet.  The following learning objectives were met by the patient :   States the definition of Gestational Diabetes  States when to check blood glucose levels  Demonstrates proper blood glucose monitoring techniques after 3 times practicing.  Plan:  Begin checking BG before breakfast and 2 hours after first bite of breakfast, lunch and dinner as directed by MD  Bring Log Book to every medical appointment   Take medication as directed by MD  Education not completed within hour of appointment time. Plan to see patient back in 1 week to continue diabetes education.  Blood glucose monitor given: [employer] Track Lot # [id number] Exp: 04/28/2018 Blood glucose reading: 310 mg/dl. Repeated test in 5 minutes: 274 mg/dl. Reported to Nurse Practitioner Marcille Buffy who prescribed Glyburide @ 2.5 mg to be taken twice a day at breakfast and supper. I explained this to patient and that the Rx needs to be picked up at her pharmacy on her way home today.   Patient instructed to monitor glucose levels: FBS: 60 - 95 mg/dl 2 hour: <120 mg/dl  Patient received the following handouts:  Nutrition Diabetes and Pregnancy  Patient will be seen in one week to continue diabetes education

## 2016-08-23 ENCOUNTER — Encounter: Payer: Self-pay | Admitting: Family Medicine

## 2016-08-24 ENCOUNTER — Encounter (HOSPITAL_COMMUNITY): Payer: Self-pay

## 2016-08-24 ENCOUNTER — Ambulatory Visit (HOSPITAL_COMMUNITY)
Admission: RE | Admit: 2016-08-24 | Discharge: 2016-08-24 | Disposition: A | Discharge status: Home / Self Care | Payer: Self-pay | Source: Ambulatory Visit | Attending: Nurse Practitioner | Admitting: Nurse Practitioner

## 2016-08-24 ENCOUNTER — Other Ambulatory Visit (HOSPITAL_COMMUNITY): Payer: Self-pay | Admitting: Nurse Practitioner

## 2016-08-24 DIAGNOSIS — Z363 Encounter for antenatal screening for malformations: Secondary | ICD-10-CM | POA: Insufficient documentation

## 2016-08-24 DIAGNOSIS — Z3689 Encounter for other specified antenatal screening: Secondary | ICD-10-CM

## 2016-08-24 DIAGNOSIS — O99212 Obesity complicating pregnancy, second trimester: Secondary | ICD-10-CM | POA: Insufficient documentation

## 2016-08-24 DIAGNOSIS — Z315 Encounter for genetic counseling: Secondary | ICD-10-CM | POA: Insufficient documentation

## 2016-08-24 DIAGNOSIS — O09522 Supervision of elderly multigravida, second trimester: Secondary | ICD-10-CM | POA: Insufficient documentation

## 2016-08-24 DIAGNOSIS — Z3A22 22 weeks gestation of pregnancy: Secondary | ICD-10-CM

## 2016-08-24 DIAGNOSIS — O24415 Gestational diabetes mellitus in pregnancy, controlled by oral hypoglycemic drugs: Secondary | ICD-10-CM | POA: Insufficient documentation

## 2016-08-24 DIAGNOSIS — O24419 Gestational diabetes mellitus in pregnancy, unspecified control: Secondary | ICD-10-CM

## 2016-08-24 NOTE — Progress Notes (Signed)
Genetic Counseling  High-Risk Gestation Note  Appointment Date:  08/24/2016 Referred By: Einar Grad, NP Date of Birth:  1970-04-01   Pregnancy History: G2P1001 Estimated Date of Delivery: 12/28/16 Estimated Gestational Age: [redacted]w[redacted]d Attending: Charlsie Merles, MD   [name] and her partner were seen for genetic counseling because of a maternal age of 46 y.o. and paternal age of 44 years old. Hausa/English interpreter was present for today's visit.    In summary:  Discussed AMA and associated risk for fetal aneuploidy  Discussed options for screening  Quad screen- previously performed; recalculated with new EDC  NIPS- declined  Ultrasound- performed today  Discussed diagnostic testing options  Amniocentesis- declined  Reviewed family history concerns   Consanguinity  Advanced paternal age  Discussed carrier screening options- declined  CF  SMA  Hemoglobinopathies  They were counseled regarding maternal age and the association with risk for chromosome conditions due to nondisjunction with aging of the ova.   We reviewed chromosomes, nondisjunction, and the associated 1 in 51 risk for fetal aneuploidy related to a maternal age of 46 y.o. at [redacted]w[redacted]d gestation. They were counseled that the risk for aneuploidy decreases as gestational age increases, accounting for those pregnancies which spontaneously abort.  We specifically discussed Down syndrome (trisomy 39), trisomies 57 and 41, and sex chromosome aneuploidies (47,XXX and 47,XXY) including the common features and prognoses of each.   Detailed ultrasound was performed today. Visualized fetal anatomy was within normal limits. Pregnancy was visualized to be [redacted]w[redacted]d, changing the Estimated Date of Delivery to 12/28/16. Complete ultrasound results reported under separate cover. We discussed the benefits and limitations of ultrasound as a screening tool for fetal aneuploidy and discussed that ultrasound cannot diagnose or rule out  chromosome conditions.   We reviewed available screening options including Quad screen and noninvasive prenatal screening (NIPS)/cell free DNA (cfDNA) screening.  They were counseled that screening tests are used to modify a patient's a priori risk for aneuploidy, typically based on age. This estimate provides a pregnancy specific risk assessment. We reviewed the benefits and limitations of each option. Specifically, we discussed the conditions for which each test screens, the detection rates, and false positive rates of each. They were also counseled regarding diagnostic testing via amniocentesis. We reviewed the approximate 1 in 300-500 risk for complications from amniocentesis, including spontaneous pregnancy loss. We discussed the possible results that the tests might provide including: positive, negative, unanticipated, and no result. Finally, they were counseled regarding the cost of each option and potential out of pocket expenses.  We reviewed that [name] previously had Quad screen performed through her OB office and was within normal limits for the conditions screened. However, given that the [facility] was changed based on today's ultrasound, recalculation was requested for the Quad screen, which is currently pending. We discussed that Quad screen does not diagnose or rule out chromosome conditions or birth defects.    After consideration of all the options, they declined NIPS and amniocentesis.  Follow-up ultrasound was scheduled. They understand that screening tests cannot rule out all birth defects or genetic syndromes. The patient was advised of this limitation and states she still does not want additional testing at this time.   [name] was provided with written information regarding cystic fibrosis (CF), spinal muscular atrophy (SMA) and hemoglobinopathies including the carrier frequency, availability of carrier screening and prenatal diagnosis if indicated.  In addition, we discussed  that CF and hemoglobinopathies are routinely screened for as part of the  [facility] newborn screening panel. She declined screening for CF, SMA and hemoglobinopathies.  Both family histories were reviewed and found to be noncontributory for birth defects, intellectual disability, and known genetic conditions. The couple reported consanguinity; their grandfathers were paternal half-siblings. Thus, the couple are sixth degree relatives to each other. We discussed that children born to a consanguineous couple are at increased risk for genetic health problems.  This increase in risk is related to the possibility of passing on recessive genes. We explained that every person carries approximately 7-10 non-working genes that when received in a double dose results in recessive genetic conditions.  In general, unrelated couples have a relatively low risk of having a child with a recessive condition because the likelihood of both parents carrying the same non-working recessive gene is very low.  However, when a couple is related, they have inherited some of their genetic information from the same family member, which leads to an increased chance that they may carry the same recessive gene and have a child with a recessive condition.  For second cousin once removed unions, the risk to have a child with a birth defect, intellectual disability, or genetic condition may be slightly increased (but less than 1%) above the general population risk (3-5%).  We reviewed chromosomes, genes, and recessive inheritance in detail. We discussed the option of expanded carrier screening, including risks, benefits and limitations. [name] declined expanded carrier screening today.  Without further information regarding the provided family history, an accurate genetic risk cannot be calculated. Further genetic counseling is warranted if more information is obtained.  Additionally, the father of the pregnancy is 5 years old. Advanced paternal age  (APA) is defined as paternal age greater than or equal to age 56.  Recent large-scale sequencing studies have shown that approximately 80% of de novo point mutations are of paternal origin.  Many studies have demonstrated a strong correlation between increased paternal age and de novo point mutations.  Although no specific data is available regarding fetal risks for fathers 83+ years old at conception, it is apparent that the overall risk for single gene conditions is increased.  To estimate the relative increase in risk of a genetic disorder with APA, the heritability of the disease must be considered.  Assuming an approximate 2x increase in risk for conditions that are exclusively paternal in origin, the risk for each individual condition is still relatively low.  It is estimated that the overall chance for a de novo mutation is ~0.5%.  There is a wide range of conditions which can be caused by new dominant gene mutations (achondroplasia, neurofibromatosis, Marfan syndrome etc.).      Diagnostic testing for each individual single gene condition is not warranted or available unless ultrasound or concerns lend suspicion to a specific condition. However, there is another NIPS platform ([facility] through [place]) that is able to assess for specific mutations in a panel of 30 selected genes covering 26 conditions. Most of these conditions follow an autosomal dominant pattern of inheritance and typically occur due to de novo gene mutations. The detection rates for these conditions vary depending upon the specific condition but range from 43% to 96%. Therefore, this screening would not identify all new dominant gene mutations. The couple indicated that they are not interested in additional optional genetic screening or testing in the pregnancy. We discussed the recommendation for a detailed ultrasound at 18+ weeks gestation and a follow up ultrasound at ~28 weeks to monitor fetal growth.  Mrs. Lynda Rainwater  [name] denied  exposure to environmental toxins or chemical agents. She denied the use of alcohol, tobacco or street drugs. She denied significant viral illnesses during the course of her pregnancy. Her medical and surgical histories were contributory for diabetes, for which she is taking glyburide.   I counseled this couple regarding the above risks and available options.  The approximate face-to-face time with the genetic counselor was 50 minutes.  Quinn Plowman, MS,  Certified Genetic Counselor 08/24/2016

## 2016-08-24 NOTE — Addendum Note (Signed)
Encounter addended by: Lenoard Aden, RT on: 08/24/2016 10:35 AM<BR>    Actions taken: Imaging Exam ended

## 2016-08-27 ENCOUNTER — Other Ambulatory Visit (HOSPITAL_COMMUNITY): Payer: Self-pay | Admitting: *Deleted

## 2016-08-27 DIAGNOSIS — O09522 Supervision of elderly multigravida, second trimester: Secondary | ICD-10-CM

## 2016-08-28 ENCOUNTER — Encounter: Payer: Self-pay | Admitting: Obstetrics and Gynecology

## 2016-08-28 ENCOUNTER — Other Ambulatory Visit: Payer: Self-pay

## 2016-09-05 ENCOUNTER — Other Ambulatory Visit (HOSPITAL_COMMUNITY): Payer: Self-pay

## 2016-09-10 ENCOUNTER — Inpatient Hospital Stay (HOSPITAL_COMMUNITY): Payer: Medicaid Other

## 2016-09-10 ENCOUNTER — Encounter (HOSPITAL_COMMUNITY): Payer: Self-pay

## 2016-09-10 ENCOUNTER — Inpatient Hospital Stay (HOSPITAL_COMMUNITY)
Admission: AD | Admit: 2016-09-10 | Discharge: 2016-09-19 | DRG: 781 | Disposition: A | Discharge status: Home / Self Care | Payer: Medicaid Other | Source: Ambulatory Visit | Attending: Obstetrics & Gynecology | Admitting: Obstetrics & Gynecology

## 2016-09-10 DIAGNOSIS — O24415 Gestational diabetes mellitus in pregnancy, controlled by oral hypoglycemic drugs: Secondary | ICD-10-CM | POA: Diagnosis present

## 2016-09-10 DIAGNOSIS — O4592 Premature separation of placenta, unspecified, second trimester: Secondary | ICD-10-CM | POA: Diagnosis present

## 2016-09-10 DIAGNOSIS — O4692 Antepartum hemorrhage, unspecified, second trimester: Secondary | ICD-10-CM | POA: Diagnosis present

## 2016-09-10 DIAGNOSIS — Z3A24 24 weeks gestation of pregnancy: Secondary | ICD-10-CM

## 2016-09-10 DIAGNOSIS — O09522 Supervision of elderly multigravida, second trimester: Secondary | ICD-10-CM

## 2016-09-10 DIAGNOSIS — O2340 Unspecified infection of urinary tract in pregnancy, unspecified trimester: Secondary | ICD-10-CM

## 2016-09-10 DIAGNOSIS — O4690 Antepartum hemorrhage, unspecified, unspecified trimester: Secondary | ICD-10-CM

## 2016-09-10 DIAGNOSIS — O10012 Pre-existing essential hypertension complicating pregnancy, second trimester: Secondary | ICD-10-CM | POA: Diagnosis present

## 2016-09-10 DIAGNOSIS — B951 Streptococcus, group B, as the cause of diseases classified elsewhere: Secondary | ICD-10-CM | POA: Diagnosis present

## 2016-09-10 DIAGNOSIS — O0990 Supervision of high risk pregnancy, unspecified, unspecified trimester: Secondary | ICD-10-CM

## 2016-09-10 DIAGNOSIS — O24419 Gestational diabetes mellitus in pregnancy, unspecified control: Secondary | ICD-10-CM

## 2016-09-10 DIAGNOSIS — O4590 Premature separation of placenta, unspecified, unspecified trimester: Secondary | ICD-10-CM | POA: Diagnosis present

## 2016-09-10 LAB — WET PREP, GENITAL
CLUE CELLS WET PREP: NONE SEEN
Sperm: NONE SEEN
TRICH WET PREP: NONE SEEN
WBC WET PREP: NONE SEEN
YEAST WET PREP: NONE SEEN

## 2016-09-10 LAB — CBC
HEMATOCRIT: 32.2 % — AB (ref 36.0–46.0)
Hemoglobin: 10.9 g/dL — ABNORMAL LOW (ref 12.0–15.0)
MCH: 25.2 pg — ABNORMAL LOW (ref 26.0–34.0)
MCHC: 33.9 g/dL (ref 30.0–36.0)
MCV: 74.4 fL — ABNORMAL LOW (ref 78.0–100.0)
Platelets: 281 10*3/uL (ref 150–400)
RBC: 4.33 MIL/uL (ref 3.87–5.11)
RDW: 17.7 % — AB (ref 11.5–15.5)
WBC: 9.6 10*3/uL (ref 4.0–10.5)

## 2016-09-10 LAB — GLUCOSE, CAPILLARY
GLUCOSE-CAPILLARY: 186 mg/dL — AB (ref 65–99)
GLUCOSE-CAPILLARY: 189 mg/dL — AB (ref 65–99)
GLUCOSE-CAPILLARY: 217 mg/dL — AB (ref 65–99)
Glucose-Capillary: 164 mg/dL — ABNORMAL HIGH (ref 65–99)

## 2016-09-10 LAB — PREPARE RBC (CROSSMATCH)

## 2016-09-10 MED ORDER — BETAMETHASONE SOD PHOS & ACET 6 (3-3) MG/ML IJ SUSP
12.0000 mg | INTRAMUSCULAR | Status: AC
  Administered 2016-09-10 – 2016-09-11 (×2): 12 mg via INTRAMUSCULAR
  Filled 2016-09-10 (×2): qty 2

## 2016-09-10 MED ORDER — GLYBURIDE 2.5 MG PO TABS
2.5000 mg | ORAL_TABLET | Freq: Two times a day (BID) | ORAL | Status: DC
  Administered 2016-09-10 – 2016-09-11 (×3): 2.5 mg via ORAL
  Filled 2016-09-10 (×5): qty 1

## 2016-09-10 MED ORDER — SODIUM CHLORIDE 0.9 % IV SOLN: Freq: Once | INTRAVENOUS | Status: DC

## 2016-09-10 MED ORDER — PRENATAL MULTIVITAMIN CH
1.0000 | ORAL_TABLET | Freq: Every day | ORAL | Status: DC
  Administered 2016-09-10 – 2016-09-18 (×8): 1 via ORAL
  Filled 2016-09-10 (×9): qty 1

## 2016-09-10 MED ORDER — DOCUSATE SODIUM 100 MG PO CAPS
100.0000 mg | ORAL_CAPSULE | Freq: Every day | ORAL | Status: DC
  Administered 2016-09-11 – 2016-09-12 (×2): 100 mg via ORAL
  Filled 2016-09-10 (×3): qty 1

## 2016-09-10 MED ORDER — ACETAMINOPHEN 325 MG PO TABS: 650.0000 mg | ORAL_TABLET | ORAL | Status: DC | PRN

## 2016-09-10 MED ORDER — POLYSACCHARIDE IRON COMPLEX 150 MG PO CAPS
150.0000 mg | ORAL_CAPSULE | Freq: Every day | ORAL | Status: DC
  Administered 2016-09-10 – 2016-09-19 (×10): 150 mg via ORAL
  Filled 2016-09-10 (×10): qty 1

## 2016-09-10 MED ORDER — CALCIUM CARBONATE ANTACID 500 MG PO CHEW
2.0000 | CHEWABLE_TABLET | ORAL | Status: DC | PRN
  Administered 2016-09-12: 400 mg via ORAL
  Filled 2016-09-10: qty 2

## 2016-09-10 NOTE — MAU Note (Signed)
Pt c/o vaginal bleeding that started around 2am when she went to use the bathroom and take a shower. Pt reports lower abdominal cramping that started right before she noticed the bleeding. Pt states she has not felt baby since 11pm.

## 2016-09-10 NOTE — H&P (Signed)
Cardell PeachRakia Swindell is a 46 y.o. female G2P1001 @24 .3 weeks presenting for vaginal bleeding. Sx started 2 hrs ago. She noticed drk red blood on the floor in the BR. She reports lower abdominal pain that started 2 hrs ago. She cannot describe the consistency, it's intermittent but she's unsure of the frequency. Reports good FM. No LOF. [facility]  OB History    Gravida Para Term Preterm AB Living   2 1 1     1    SAB TAB Ectopic Multiple Live Births           1     Past Medical History:  Diagnosis Date  . Hypertension    Past Surgical History:  Procedure Laterality Date  . DILATION AND CURETTAGE OF UTERUS    . HYSTEROSCOPY WITH RESECTOSCOPE  2011   Family History: family history is not on file. Social History:  reports that she has never smoked. She has never used smokeless tobacco. She reports that she does not drink alcohol or use drugs.     Maternal Diabetes: Yes:  Diabetes Type:  Insulin/Medication controlled Genetic Screening: Normal Maternal Ultrasounds/Referrals: Normal Fetal Ultrasounds or other Referrals:  None Maternal Substance Abuse:  No Significant Maternal Medications:  None Significant Maternal Lab Results:  None Other Comments:  None  Review of Systems  Gastrointestinal: Positive for abdominal pain.  Genitourinary:       +VB   Maternal Medical History:  Prenatal complications: AMA, obesity  Prenatal Complications - Diabetes: gestational. Diabetes is managed by oral agent (monotherapy).        Blood pressure 124/64, pulse 89, temperature 99.1 F (37.3 C), temperature source Oral, resp. rate 17, last menstrual period 04/10/2016, SpO2 100 %. Maternal Exam:  Uterine Assessment: Contraction frequency is rare.      Fetal Exam Fetal Monitor Review: Mode: ultrasound.   Baseline rate: 150.  Variability: moderate (6-25 bpm).   Pattern: no accelerations and no decelerations.    Fetal State Assessment: Category I - tracings are  normal.     Physical Exam  Nursing note and vitals reviewed. Constitutional: She is oriented to person, place, and time. She appears well-developed and well-nourished.  HENT:  Head: Normocephalic and atraumatic.  Neck: Normal range of motion.  Cardiovascular: Normal rate.   Respiratory: Effort normal. No respiratory distress.  GI: Soft. She exhibits no distension. There is no tenderness.  Genitourinary:  Genitourinary Comments: External: no erythema, small 1-2 mm round lesion on left labia minora, ? Recent bleeding Vagina: rugated, pink, moist, moderate thick white discharge, no trace of blood seen Cervix closed/thick   Musculoskeletal: Normal range of motion.  Neurological: She is alert and oriented to person, place, and time.  Skin: Skin is warm and dry.  Psychiatric: She has a normal mood and affect.    US prelim: ?marginal abruption  Prenatal labs: ABO, Rh:  O Pos Antibody:   Rubella:   RPR: Nonreactive (08/02 0000)  HBsAg:    HIV:    GBS:     Assessment/Plan: 24.[redacted] weeks gestation Vaginal bleeding in pregnancy Admit to Ante Continuous EFM BMZ NPO Mngt per MD   Donette LarryMelanie Irja Wheless 09/10/2016, 3:45 AM

## 2016-09-11 DIAGNOSIS — O4590 Premature separation of placenta, unspecified, unspecified trimester: Secondary | ICD-10-CM | POA: Diagnosis present

## 2016-09-11 DIAGNOSIS — Z3A24 24 weeks gestation of pregnancy: Secondary | ICD-10-CM

## 2016-09-11 LAB — GLUCOSE, CAPILLARY
GLUCOSE-CAPILLARY: 193 mg/dL — AB (ref 65–99)
GLUCOSE-CAPILLARY: 196 mg/dL — AB (ref 65–99)
Glucose-Capillary: 212 mg/dL — ABNORMAL HIGH (ref 65–99)
Glucose-Capillary: 291 mg/dL — ABNORMAL HIGH (ref 65–99)

## 2016-09-11 MED ORDER — INSULIN ASPART 100 UNIT/ML ~~LOC~~ SOLN
0.0000 [IU] | Freq: Three times a day (TID) | SUBCUTANEOUS | Status: DC
  Administered 2016-09-11: 3 [IU] via SUBCUTANEOUS
  Administered 2016-09-12: 6 [IU] via SUBCUTANEOUS

## 2016-09-11 MED ORDER — ZOLPIDEM TARTRATE 5 MG PO TABS
5.0000 mg | ORAL_TABLET | Freq: Every evening | ORAL | Status: DC | PRN
  Administered 2016-09-12 – 2016-09-16 (×5): 5 mg via ORAL
  Filled 2016-09-11 (×5): qty 1

## 2016-09-11 NOTE — Progress Notes (Addendum)
Inpatient Diabetes Program Recommendations  Glucose Goals during Pregnancy:  Fasting <90 mg/dl  2-hour Postprandial <469<120 mg/dl   Results for [name], Desiree (MRN [id number]) as of 09/11/2016 10:41  Ref. Range 09/10/2016 11:46 09/10/2016 14:50 09/10/2016 17:03 09/10/2016 23:37  Glucose-Capillary Latest Ref Range: 65 - 99 mg/dL 244164 (H) 010189 (H) 272217 (H) 186 (H)   Results for [name], Desiree (MRN [id number]) as of 09/11/2016 10:41  Ref. Range 09/11/2016 05:02 09/11/2016 07:09  Glucose-Capillary Latest Ref Range: 65 - 99 mg/dL 742193 (H) 595212 (H)    Admit: Vaginal bleeding and marginal sinus abruption in the second trimester  Patient is 24 weeks 4 days Gestation  Patient has Positive Diagnosis of Gestational Diabetes      MD- Note patient seen by Jenita SeashoreBev Paddock (Certified Diabetes Educator) on 08/16/16 for Gestational Diabetes self-management education.   At that visit, pt's CBGs were elevated and patient was started on Glyburide 2.5 mg BID.  Note patient has received 12 mg Betamethasone X 2 doses since admission.  May consider starting Novolog Correction scale per the Diabetic Pregnant Patient order set.  Correct the Fasting and 2-Hour Postprandial Glucose levels.  Recommend the Novolog 0-14 units scale per the Diabetic Pregnant Patient order set (this would be the 0-30 units Total Daily Dose scale)     --Will follow patient during hospitalization--  Ambrose FinlandJeannine Johnston Joel Cowin RN, MSN, CDE Diabetes Coordinator Inpatient Glycemic Control Team Team Pager: [phone] (8a-5p)

## 2016-09-11 NOTE — Progress Notes (Signed)
Initial Nutrition Assessment  DOCUMENTATION CODES:  Obesity unspecified  INTERVENTION:  Gestational Diabetic Diet  NUTRITION DIAGNOSIS:  Increased nutrient needs related to  (pregnancy and fetal growth requirements) as evidenced by  (24 weeks IUP).  GOAL:  Patient will meet greater than or equal to 90% of their needs  MONITOR:  Labs  REASON FOR ASSESSMENT:  Gestational Diabetes, Antenatal   ASSESSMENT:  24 4/7 weeks, adm due to vaginal bleeding. Weight 203 lbs at initial prematal visit, BMI 32.8. 7 lb weight gain Serum glucose elevated s/p betamethasone.  Diet Order:  Diet gestational carb mod Room service appropriate? Yes; Fluid consistency: Thin  Height:   Ht Readings from Last 1 Encounters:  09/10/16 5\' 6"  (1.676 m)   Weight:   Wt Readings from Last 1 Encounters:  09/10/16 210 lb (95.3 kg)   Ideal Body Weight:   130 lbs  BMI:  Body mass index is 33.89 kg/m.  Estimated Nutritional Needs:   Kcal:  2000-2200  Protein:  90-100g  Fluid:  2.3 L  EDUCATION NEEDS:   Education needs addressed Had initial outpt education on 8/9. Pt unable to read english, translator was present at that visit. Intention was to have pt come back for a subsequent visit one week later, but this is not documented.  Elisabeth CaraKatherine Ade Stmarie M.Odis LusterEd. R.D. LDN Neonatal Nutrition Support Specialist/RD III Pager [phone]      Phone [phone]

## 2016-09-11 NOTE — Progress Notes (Signed)
Patient ID: [name] [name], female   DOB: 06/25/1970, 46 y.o.   MRN: [id number] FACULTY PRACTICE ANTEPARTUM(COMPREHENSIVE) NOTE  [name] is a 46 y.o. G2P1001 with Estimated Date of Delivery: 12/28/16   By  early ultrasound 4646w4d  who is admitted for vaginal bleeding and marginal sinus abruption in the second trimester.    Fetal presentation is transvers by sonogram. Length of Stay:  1  Days  Date of admission:09/10/2016  Subjective: Pt denies any further bleeding Patient reports the fetal movement as active. Patient reports uterine contraction  activity as none. Patient reports  vaginal bleeding as none. Patient describes fluid per vagina as None.  Vitals:  Blood pressure 122/71, pulse 91, temperature 97.9 F (36.6 C), temperature source Oral, resp. rate 17, height 5\' 6"  (1.676 m), weight 210 lb (95.3 kg), last menstrual period 04/10/2016, SpO2 100 %. Vitals:   09/10/16 1528 09/10/16 2011 09/10/16 2341 09/11/16 0508  BP: 130/83 134/79 122/63 122/71  Pulse: (!) 102 (!) 101 (!) 101 91  Resp: 18 19 17 17   Temp: 98.7 F (37.1 C) 98.1 F (36.7 C) 98.6 F (37 C) 97.9 F (36.6 C)  TempSrc: Oral Oral Oral Oral  SpO2: 95% 100% 100% 100%  Weight:      Height:       Physical Examination:  General appearance - alert, well appearing, and in no distress Abdomen - benign Fundal Height:  size equals dates Pelvic Exam:  deferred Cervical Exam: . Extremities: extremities normal, atraumatic, no cyanosis or edema with DTRs 2+ bilaterally Membranes:intact  Fetal Monitoring:  reassuring given EGA     Labs:  Results for orders placed or performed during the hospital encounter of 09/10/16 (from the past 24 hour(s))  Glucose, capillary   Collection Time: 09/10/16 11:46 AM  Result Value Ref Range   Glucose-Capillary 164 (H) 65 - 99 mg/dL  Glucose, capillary   Collection Time: 09/10/16  2:50 PM  Result Value Ref Range   Glucose-Capillary 189 (H) 65 - 99 mg/dL  Glucose, capillary   Collection  Time: 09/10/16  5:03 PM  Result Value Ref Range   Glucose-Capillary 217 (H) 65 - 99 mg/dL  Glucose, capillary   Collection Time: 09/10/16 11:37 PM  Result Value Ref Range   Glucose-Capillary 186 (H) 65 - 99 mg/dL  Glucose, capillary   Collection Time: 09/11/16  5:02 AM  Result Value Ref Range   Glucose-Capillary 193 (H) 65 - 99 mg/dL  Glucose, capillary   Collection Time: 09/11/16  7:09 AM  Result Value Ref Range   Glucose-Capillary 212 (H) 65 - 99 mg/dL   Comment 1 Notify RN    Comment 2 Document in Chart     Imaging Studies:      Medications:  Scheduled . docusate sodium  100 mg Oral Daily  . glyBURIDE  2.5 mg Oral BID WC  . iron polysaccharides  150 mg Oral Daily  . prenatal multivitamin  1 tablet Oral Q1200   I have reviewed the patient's current medications.  ASSESSMENT: G2P1001 6646w4d Estimated Date of Delivery: 12/28/16  Elevated CBG secondary to betamethasone Patient Active Problem List   Diagnosis Date Noted  . Vaginal bleeding in pregnancy 09/10/2016  . [redacted] weeks gestation of pregnancy   . Advanced maternal age in multigravida, second trimester 07/07/2016  . Hypertension complicating pregnancy 07/07/2016    PLAN: Continue in house observation for 7 days or longer if indicated S/P betamethasone x 2  Jaselle Pryer H 09/11/2016,9:43 AM

## 2016-09-11 NOTE — Progress Notes (Signed)
Alerted by nursing to markedly elevated glucose values, most recently 291 2 hours PP. A2GDM on glyburide, has recently had bmz. Will stop glyburide, start SSI aspart.

## 2016-09-12 DIAGNOSIS — O4692 Antepartum hemorrhage, unspecified, second trimester: Secondary | ICD-10-CM

## 2016-09-12 LAB — GLUCOSE, CAPILLARY
GLUCOSE-CAPILLARY: 142 mg/dL — AB (ref 65–99)
GLUCOSE-CAPILLARY: 241 mg/dL — AB (ref 65–99)
Glucose-Capillary: 235 mg/dL — ABNORMAL HIGH (ref 65–99)
Glucose-Capillary: 146 mg/dL — ABNORMAL HIGH (ref 65–99)

## 2016-09-12 MED ORDER — SODIUM CHLORIDE 0.9% FLUSH
3.0000 mL | Freq: Two times a day (BID) | INTRAVENOUS | Status: DC
  Administered 2016-09-12 – 2016-09-18 (×13): 3 mL via INTRAVENOUS

## 2016-09-12 MED ORDER — SODIUM CHLORIDE 0.9% FLUSH
3.0000 mL | INTRAVENOUS | Status: DC | PRN
  Administered 2016-09-15: 3 mL via INTRAVENOUS
  Filled 2016-09-12: qty 3

## 2016-09-12 MED ORDER — INSULIN ASPART 100 UNIT/ML ~~LOC~~ SOLN
0.0000 [IU] | Freq: Four times a day (QID) | SUBCUTANEOUS | Status: DC
  Administered 2016-09-12: 3 [IU] via SUBCUTANEOUS
  Administered 2016-09-13 (×4): 2 [IU] via SUBCUTANEOUS
  Administered 2016-09-14: 4 [IU] via SUBCUTANEOUS
  Administered 2016-09-14: 3 [IU] via SUBCUTANEOUS
  Administered 2016-09-15: 2 [IU] via SUBCUTANEOUS
  Administered 2016-09-15: 6 [IU] via SUBCUTANEOUS
  Administered 2016-09-16: 3 [IU] via SUBCUTANEOUS
  Administered 2016-09-16 – 2016-09-17 (×3): 2 [IU] via SUBCUTANEOUS

## 2016-09-12 MED ORDER — GLYBURIDE 2.5 MG PO TABS
2.5000 mg | ORAL_TABLET | Freq: Two times a day (BID) | ORAL | Status: DC
  Administered 2016-09-12 – 2016-09-13 (×3): 2.5 mg via ORAL
  Filled 2016-09-12 (×3): qty 1

## 2016-09-12 MED ORDER — DOCUSATE SODIUM 100 MG PO CAPS
100.0000 mg | ORAL_CAPSULE | Freq: Two times a day (BID) | ORAL | Status: DC | PRN
  Administered 2016-09-17 – 2016-09-18 (×2): 100 mg via ORAL
  Filled 2016-09-12 (×2): qty 1

## 2016-09-12 NOTE — Progress Notes (Signed)
Patient ID: [name], female   DOB: 06/05/1970, 46 y.o.   MRN: [id number] FACULTY PRACTICE ANTEPARTUM(COMPREHENSIVE) NOTE  [name] is a 46 y.o. G2P1001 with Estimated Date of Delivery: 12/28/16   By  early ultrasound 3534w4d  who is admitted for vaginal bleeding and marginal sinus abruption in the second trimester.    Fetal presentation is unsure. Length of Stay:  2  Days  Date of admission:09/10/2016  Subjective: Pt denies any further bleeding Patient reports the fetal movement as active. Patient reports uterine contraction  activity as none. Patient reports  vaginal bleeding as none. Patient describes fluid per vagina as None.  Vitals:  Blood pressure 116/65, pulse 73, temperature 97.8 F (36.6 C), temperature source Oral, resp. rate 20, height 5\' 6"  (1.676 m), weight 210 lb (95.3 kg), last menstrual period 04/10/2016, SpO2 98 %. Vitals:   09/11/16 1933 09/11/16 2338 09/12/16 0409 09/12/16 0800  BP: 140/72 135/78 132/72 116/65  Pulse: 90 81 87 73  Resp: 19 18 18 20   Temp: 98.2 F (36.8 C) 98.2 F (36.8 C) 98.9 F (37.2 C) 97.8 F (36.6 C)  TempSrc: Oral Oral Oral Oral  SpO2: 100% 100% 100% 98%  Weight:      Height:       Physical Examination:  General appearance - alert, well appearing, and in no distress Abdomen - benign Fundal Height:  size equals dates Pelvic Exam:  deferred Cervical Exam: . Extremities: extremities normal, atraumatic, no cyanosis or edema with DTRs 2+ bilaterally Membranes:intact  Fetal Monitoring:  Baseline 150, mod variability, no accels, no decels  Labs:  Results for orders placed or performed during the hospital encounter of 09/10/16 (from the past 24 hour(s))  Glucose, capillary   Collection Time: 09/11/16  6:12 PM  Result Value Ref Range   Glucose-Capillary 291 (H) 65 - 99 mg/dL   Comment 1 Notify RN    Comment 2 Document in Chart   Glucose, capillary   Collection Time: 09/11/16  9:07 PM  Result Value Ref Range   Glucose-Capillary  196 (H) 65 - 99 mg/dL  Glucose, capillary   Collection Time: 09/12/16  4:06 AM  Result Value Ref Range   Glucose-Capillary 142 (H) 65 - 99 mg/dL    Imaging Studies:      Medications:  Scheduled . docusate sodium  100 mg Oral Daily  . insulin aspart  0-14 Units Subcutaneous TID WC  . iron polysaccharides  150 mg Oral Daily  . prenatal multivitamin  1 tablet Oral Q1200   I have reviewed the patient's current medications.  ASSESSMENT: G2P1001 3034w4d Estimated Date of Delivery: 12/28/16   Patient Active Problem List   Diagnosis Date Noted  . [redacted] weeks gestation of pregnancy   . Vaginal bleeding in pregnancy, second trimester   . Vaginal bleeding in pregnancy 09/10/2016  . [redacted] weeks gestation of pregnancy   . Advanced maternal age in multigravida, second trimester 07/07/2016  . Hypertension complicating pregnancy 07/07/2016    PLAN: Continue in house observation for 7 days or longer if indicated BMZ x 2 completed Elevated CBG. Will restart glyburide 2.5 mg BID and cover with sliding scale Continue current care  Meka Lewan 09/12/2016,9:02 AM

## 2016-09-13 ENCOUNTER — Other Ambulatory Visit: Payer: Self-pay

## 2016-09-13 ENCOUNTER — Encounter: Payer: Self-pay | Admitting: Obstetrics and Gynecology

## 2016-09-13 LAB — GLUCOSE, CAPILLARY
GLUCOSE-CAPILLARY: 170 mg/dL — AB (ref 65–99)
GLUCOSE-CAPILLARY: 163 mg/dL — AB (ref 65–99)
GLUCOSE-CAPILLARY: 174 mg/dL — AB (ref 65–99)
Glucose-Capillary: 164 mg/dL — ABNORMAL HIGH (ref 65–99)

## 2016-09-13 LAB — CREATININE CLEARANCE, URINE, 24 HOUR
COLLECTION INTERVAL-CRCL: 24 h
CREATININE 24H UR: 1487 mg/d (ref 600–1800)
CREATININE, URINE: 46.48 mg/dL
Creatinine Clearance: 207 mL/min — ABNORMAL HIGH (ref 75–115)
Urine Total Volume-CRCL: 3200 mL

## 2016-09-13 LAB — GC/CHLAMYDIA PROBE AMP (~~LOC~~) NOT AT ARMC
Chlamydia: NEGATIVE
Neisseria Gonorrhea: NEGATIVE

## 2016-09-13 LAB — TYPE AND SCREEN
ABO/RH(D): O POS
ANTIBODY SCREEN: NEGATIVE

## 2016-09-13 LAB — CREATININE, SERUM
Creatinine, Ser: 0.5 mg/dL (ref 0.44–1.00)
GFR calc Af Amer: >60 mL/min (ref >60)
GFR calc non Af Amer: >60 mL/min (ref >60)

## 2016-09-13 MED ORDER — GLYBURIDE 5 MG PO TABS
5.0000 mg | ORAL_TABLET | Freq: Two times a day (BID) | ORAL | Status: DC
  Administered 2016-09-13 – 2016-09-17 (×9): 5 mg via ORAL
  Filled 2016-09-13 (×10): qty 1

## 2016-09-13 MED ORDER — DOCUSATE SODIUM 100 MG PO CAPS
100.0000 mg | ORAL_CAPSULE | Freq: Two times a day (BID) | ORAL | Status: DC
  Administered 2016-09-13 – 2016-09-15 (×6): 100 mg via ORAL
  Filled 2016-09-13 (×6): qty 1

## 2016-09-13 NOTE — Progress Notes (Signed)
Patient ID: [name], female   DOB: [birth year], 46 y.o.   MRN: [id number] FACULTY PRACTICE ANTEPARTUM(COMPREHENSIVE) NOTE  Janalyn Higby is a 46 y.o. G2P1001 at [redacted]w[redacted]d  who is admitted for vaginal bleeding and marginal sinus abruption in the second trimester.    Fetal presentation is unsure. Length of Stay:  3  Days  Date of admission:09/10/2016  Subjective: Pt denies any further bleeding. She reports some constipation Patient reports the fetal movement as active. Patient reports uterine contraction  activity as none. Patient reports  vaginal bleeding as none. Patient describes fluid per vagina as None.  Vitals:  Blood pressure 126/63, pulse 80, temperature 98.4 F (36.9 C), temperature source Oral, resp. rate 18, height  (1.676 m), weight 210 lb (95.3 kg), last menstrual period 04/10/2016, SpO2 100 %. Vitals:   09/12/16 1600 09/12/16 1948 09/13/16 0400 09/13/16 0800  BP: (!) 111/50 136/73 128/72 126/63  Pulse: 81 85 86 80  Resp: Temp: 98.6 F (37 C) 98.3 F (36.8 C) 98.4 F (36.9 C) 98.4 F (36.9 C)  TempSrc: Oral Oral Oral Oral  SpO2: 98% 100% 97% 100%  Weight:      Height:       Physical Examination:  General appearance - alert, well appearing, and in no distress Abdomen - benign Fundal Height:  size equals dates Pelvic Exam:  deferred Cervical Exam:  Not indicated Extremities: extremities normal, atraumatic, no cyanosis or edema with DTRs 2+ bilaterally Membranes:intact  Fetal Monitoring:  Baseline 150, mod variability, no accels, no decels- appropriate for gestational age  Labs:  Results for orders placed or performed during the hospital encounter of 09/10/16 (from the past 24 hour(s))  Glucose, capillary   Collection Time: 09/12/16 11:12 AM  Result Value Ref Range   Glucose-Capillary 241 (H) 65 - 99 mg/dL   Comment 1 Notify RN    Comment 2 Document in Chart   Glucose, capillary   Collection Time: 09/12/16  5:33 PM  Result Value Ref Range   Glucose-Capillary 235 (H) 65 - 99 mg/dL  Glucose, capillary   Collection Time: 09/12/16 10:51 PM  Result Value Ref Range   Glucose-Capillary 146 (H) 65 - 99 mg/dL  Glucose, capillary   Collection Time: 09/13/16  4:02 AM  Result Value Ref Range   Glucose-Capillary 164 (H) 65 - 99 mg/dL  Type and screen [facility] [place]   Collection Time: 09/13/16  8:26 AM  Result Value Ref Range   ABO/RH(D) O POS    Antibody Screen PENDING    Sample Expiration 09/16/2016     Imaging Studies:      Medications:  Scheduled . glyBURIDE  2.5 mg Oral BID WC  . insulin aspart  0-14 Units Subcutaneous QID  . iron polysaccharides  150 mg Oral Daily  . prenatal multivitamin  1 tablet Oral Q1200  . sodium chloride flush  3 mL Intravenous Q12H   I have reviewed the patient's current medications.  ASSESSMENT: G2P1001 [redacted]w[redacted]d Estimated Date of Delivery: 12/28/16   Patient Active Problem List   Diagnosis Date Noted  . [redacted] weeks gestation of pregnancy   . Vaginal bleeding in pregnancy, second trimester   . Vaginal bleeding in pregnancy 09/10/2016  . [redacted] weeks gestation of pregnancy   . Advanced maternal age in multigravida, second trimester 07/07/2016  . Hypertension complicating pregnancy 07/07/2016    PLAN: Continue inpatient observation BMZ x 2 completed Elevated CBG. Will increase glyburide 5 mg BID and continue  to cover with sliding scale Continue current care  Camilla Skeen 09/13/2016,9:24 AM

## 2016-09-14 LAB — BPAM RBC
BLOOD PRODUCT EXPIRATION DATE: 201809152359
BLOOD PRODUCT EXPIRATION DATE: 201809152359
BLOOD PRODUCT EXPIRATION DATE: 201810012359
Blood Product Expiration Date: 201809292359
ISSUE DATE / TIME: 201808161247
ISSUE DATE / TIME: 201808161247
ISSUE DATE / TIME: 201808240959
UNIT TYPE AND RH: 5100
UNIT TYPE AND RH: 5100
UNIT TYPE AND RH: 5100
Unit Type and Rh: 5100

## 2016-09-14 LAB — TYPE AND SCREEN
ABO/RH(D): O POS
ANTIBODY SCREEN: NEGATIVE
UNIT DIVISION: 0
Unit division: 0
Unit division: 0
Unit division: 0

## 2016-09-14 LAB — GLUCOSE, CAPILLARY
GLUCOSE-CAPILLARY: 114 mg/dL — AB (ref 65–99)
GLUCOSE-CAPILLARY: 235 mg/dL — AB (ref 65–99)
GLUCOSE-CAPILLARY: 112 mg/dL — AB (ref 65–99)
GLUCOSE-CAPILLARY: 252 mg/dL — AB (ref 65–99)

## 2016-09-14 NOTE — Progress Notes (Signed)
Patient ID: [name], female   DOB: 06/06/1970, 46 y.o.   MRN: [id number]   FACULTY PRACTICE ANTEPARTUM(COMPREHENSIVE) NOTE  [name] is a 46 y.o. G2P1001 at 4968w0d who is admitted for vaginal bleeding and marginal sinus abruption in the second trimester.    Fetal presentation is unsure. Length of Stay:  4  Days  Date of admission:09/10/2016  Subjective: Pt denies any further bleeding. She reports some constipation Patient reports the fetal movement as active. Patient reports uterine contraction  activity as none. Patient reports  vaginal bleeding as none. Patient describes fluid per vagina as None.  Vitals:  Blood pressure 120/73, pulse 78, temperature 98.1 F (36.7 C), temperature source Oral, resp. rate 17, height 5\' 6"  (1.676 m), weight 210 lb (95.3 kg), last menstrual period 04/10/2016, SpO2 100 %. Vitals:   09/13/16 2058 09/13/16 2200 09/14/16 0546 09/14/16 0550  BP: 129/75   120/73  Pulse: 77   78  Resp: 18 17 17 17   Temp: 98.4 F (36.9 C)   98.1 F (36.7 C)  TempSrc: Oral   Oral  SpO2: 97%   100%  Weight:      Height:       Physical Examination:  General appearance - alert, well appearing, and in no distress Abdomen - benign Fundal Height:  size equals dates Pelvic Exam:  deferred Cervical Exam:  Not indicated Extremities: extremities normal, atraumatic, no cyanosis or edema with DTRs 2+ bilaterally Membranes:intact  Fetal Monitoring:  Baseline 150, mod variability, no accels, no decels- appropriate for gestational age  Labs:  Results for orders placed or performed during the hospital encounter of 09/10/16 (from the past 24 hour(s))  [facility] [place]   Collection Time: 09/13/16  8:26 AM  Result Value Ref Range   ABO/RH(D) O POS    Antibody Screen NEG    Sample Expiration 09/16/2016   Glucose, capillary   Collection Time: 09/13/16 10:05 AM  Result Value Ref Range   Glucose-Capillary 170 (H) 65 - 99 mg/dL  Creatinine  clearance, urine, 24 hour   Collection Time: 09/13/16 12:00 PM  Result Value Ref Range   Urine Total Volume-CRCL 3,200 mL   Collection Interval-CRCL 24 hours   Creatinine, Urine 46.48 mg/dL   Creatinine, 40J24H Ur 8,1191,487 600 - 1,800 mg/day   Creatinine Clearance 207 (H) 75 - 115 mL/min  Glucose, capillary   Collection Time: 09/13/16  3:07 PM  Result Value Ref Range   Glucose-Capillary 163 (H) 65 - 99 mg/dL  Creatinine, serum   Collection Time: 09/13/16  3:14 PM  Result Value Ref Range   Creatinine, Ser 0.50 0.44 - 1.00 mg/dL   GFR calc non Af Amer >60 >60 mL/min   GFR calc Af Amer >60 >60 mL/min  Glucose, capillary   Collection Time: 09/13/16  8:16 PM  Result Value Ref Range   Glucose-Capillary 174 (H) 65 - 99 mg/dL  Glucose, capillary   Collection Time: 09/14/16  5:46 AM  Result Value Ref Range   Glucose-Capillary 114 (H) 65 - 99 mg/dL    Imaging Studies:      Medications:  Scheduled . docusate sodium  100 mg Oral BID  . glyBURIDE  5 mg Oral BID WC  . insulin aspart  0-14 Units Subcutaneous QID  . iron polysaccharides  150 mg Oral Daily  . prenatal multivitamin  1 tablet Oral Q1200  . sodium chloride flush  3 mL Intravenous Q12H   I have reviewed the patient's  current medications.  ASSESSMENT: G2P1001 [redacted]w[redacted]d Estimated Date of Delivery: 12/28/16   Patient Active Problem List   Diagnosis Date Noted  . [redacted] weeks gestation of pregnancy   . Vaginal bleeding in pregnancy, second trimester   . Vaginal bleeding in pregnancy 09/10/2016  . [redacted] weeks gestation of pregnancy   . Advanced maternal age in multigravida, second trimester 07/07/2016  . Hypertension complicating pregnancy 07/07/2016    PLAN: Continue inpatient observation BMZ x 2 completed Elevated CBG nut improving from previous. Will continue glyburide 5 mg BID and continue to cover with sliding scale Continue current care  Marcell Chavarin 09/14/2016,7:38 AM

## 2016-09-15 ENCOUNTER — Encounter (HOSPITAL_COMMUNITY): Payer: Self-pay

## 2016-09-15 LAB — GLUCOSE, CAPILLARY
GLUCOSE-CAPILLARY: 311 mg/dL — AB (ref 65–99)
Glucose-Capillary: 142 mg/dL — ABNORMAL HIGH (ref 65–99)
Glucose-Capillary: 197 mg/dL — ABNORMAL HIGH (ref 65–99)
Glucose-Capillary: 118 mg/dL — ABNORMAL HIGH (ref 65–99)

## 2016-09-15 NOTE — Progress Notes (Signed)
Patient ID: Desiree PeachRakia Bertoni, female   DOB: 07/04/1970, 46 y.o.   MRN: [id number] FACULTY PRACTICE ANTEPARTUM(COMPREHENSIVE) NOTE  [name] is a 46 y.o. G2P1001 at 4443w1d by who is admitted for marginal abruption vaginal bleeding.   Fetal presentation is transverse. Length of Stay:  5  Days  Subjective:  Patient reports the fetal movement as active. Patient reports uterine contraction  activity as none. Patient reports  vaginal bleeding as none. Patient describes fluid per vagina as None.  Vitals:  Blood pressure 121/79, pulse 81, temperature 98.3 F (36.8 C), temperature source Oral, resp. rate 16, height 5\' 6"  (1.676 m), weight 210 lb (95.3 kg), last menstrual period 04/10/2016, SpO2 100 %. Physical Examination:  General appearance - alert, well appearing, and in no distress Heart - normal rate and regular rhythm Abdomen - soft, nontender, nondistended Fundal Height:  size equals dates Cervical Exam: Not evaluated. . Extremities: extremities normal, atraumatic, no cyanosis or edema and Homans sign is negative, no sign of DVT  Membranes:intact  Fetal Monitoring:   Fetal Heart Rate A  Mode -- [off] filed at 09/14/2016 2000  Baseline Rate (A) 145 bpm filed at 09/14/2016 1123  Variability 6-25 BPM filed at 09/14/2016 1123  Accelerations 10 x 10 filed at 09/14/2016 1123  Decelerations Variable filed at 09/14/2016 1123     Labs:  Results for orders placed or performed during the hospital encounter of 09/10/16 (from the past 24 hour(s))  Glucose, capillary   Collection Time: 09/14/16  4:12 PM  Result Value Ref Range   Glucose-Capillary 112 (H) 65 - 99 mg/dL  Glucose, capillary   Collection Time: 09/14/16 10:00 PM  Result Value Ref Range   Glucose-Capillary 252 (H) 65 - 99 mg/dL  Glucose, capillary   Collection Time: 09/15/16  5:31 AM  Result Value Ref Range   Glucose-Capillary 142 (H) 65 - 99 mg/dL      Medications:  Scheduled . docusate sodium  100 mg Oral BID  .  glyBURIDE  5 mg Oral BID WC  . insulin aspart  0-14 Units Subcutaneous QID  . iron polysaccharides  150 mg Oral Daily  . prenatal multivitamin  1 tablet Oral Q1200  . sodium chloride flush  3 mL Intravenous Q12H   I have reviewed the patient's current medications.  ASSESSMENT: Patient Active Problem List   Diagnosis Date Noted  . [redacted] weeks gestation of pregnancy   . Vaginal bleeding in pregnancy, second trimester   . Vaginal bleeding in pregnancy 09/10/2016  . [redacted] weeks gestation of pregnancy   . Advanced maternal age in multigravida, second trimester 07/07/2016  . Hypertension complicating pregnancy 07/07/2016    PLAN: Observation in hospital for recurrence of vaginal bleeding  Scheryl DarterJames Arnold 09/15/2016,10:28 AM

## 2016-09-15 NOTE — Progress Notes (Signed)
Ambien 5 mg po administered for insomnia.

## 2016-09-16 ENCOUNTER — Encounter (HOSPITAL_COMMUNITY): Payer: Self-pay | Admitting: Obstetrics and Gynecology

## 2016-09-16 DIAGNOSIS — O2340 Unspecified infection of urinary tract in pregnancy, unspecified trimester: Secondary | ICD-10-CM

## 2016-09-16 DIAGNOSIS — O0990 Supervision of high risk pregnancy, unspecified, unspecified trimester: Secondary | ICD-10-CM

## 2016-09-16 DIAGNOSIS — O24419 Gestational diabetes mellitus in pregnancy, unspecified control: Secondary | ICD-10-CM

## 2016-09-16 DIAGNOSIS — B951 Streptococcus, group B, as the cause of diseases classified elsewhere: Secondary | ICD-10-CM | POA: Diagnosis present

## 2016-09-16 LAB — GLUCOSE, CAPILLARY
GLUCOSE-CAPILLARY: 160 mg/dL — AB (ref 65–99)
GLUCOSE-CAPILLARY: 165 mg/dL — AB (ref 65–99)
GLUCOSE-CAPILLARY: 211 mg/dL — AB (ref 65–99)
Glucose-Capillary: 160 mg/dL — ABNORMAL HIGH (ref 65–99)

## 2016-09-16 LAB — TSH: TSH: 2.169 u[IU]/mL (ref 0.350–4.500)

## 2016-09-16 LAB — HEMOGLOBIN A1C
Hgb A1c MFr Bld: 7.1 % — ABNORMAL HIGH (ref 4.8–5.6)
Mean Plasma Glucose: 157.07 mg/dL

## 2016-09-16 LAB — TYPE AND SCREEN
ABO/RH(D): O POS
ANTIBODY SCREEN: NEGATIVE

## 2016-09-16 MED ORDER — ASPIRIN EC 81 MG PO TBEC
81.0000 mg | DELAYED_RELEASE_TABLET | Freq: Every day | ORAL | Status: DC
  Administered 2016-09-16 – 2016-09-19 (×4): 81 mg via ORAL
  Filled 2016-09-16 (×4): qty 1

## 2016-09-16 NOTE — Progress Notes (Signed)
Ambien 5 mg po administered for insomnia. We will continue to monitor its effect.

## 2016-09-16 NOTE — Progress Notes (Signed)
FACULTY PRACTICE ANTEPARTUM(COMPREHENSIVE) NOTE  [name] is a 46 y.o. G2P1001 at 3058w2d  Vaginal bleeding.   Fetal presentation is transverse. Length of Stay:  6  Days  Subjective:  Patient reports the fetal movement as active. Patient reports uterine contraction  activity as none. Patient reports  vaginal bleeding as none. Patient describes fluid per vagina as None.  Vitals:  Blood pressure 119/78, pulse 89, temperature 98.4 F (36.9 C), temperature source Oral, resp. rate 16, height 5\' 6"  (1.676 m), weight 95.3 kg (210 lb), last menstrual period 04/10/2016, SpO2 100 %. Physical Examination:  General appearance - alert, well appearing, and in no distress Heart - normal rate and regular rhythm Abdomen - soft, nontender, nondistended Fundal Height:  size equals dates Cervical Exam: Not evaluated.  Extremities: extremities normal, atraumatic, no cyanosis or edema and Homans sign is negative, no sign of DVT  Membranes:intact  Fetal Monitoring:   Fetal Heart Rate A  Mode External filed at 09/15/2016 1118  Baseline Rate (A) 140 bpm filed at 09/15/2016 1118  Variability 6-25 BPM filed at 09/15/2016 1118  Accelerations 15 x 15 filed at 09/15/2016 1118  Decelerations None filed at 09/15/2016 1118     Labs:  Results for orders placed or performed during the hospital encounter of 09/10/16 (from the past 24 hour(s))  Glucose, capillary   Collection Time: 09/15/16 11:56 AM  Result Value Ref Range   Glucose-Capillary 197 (H) 65 - 99 mg/dL  Glucose, capillary   Collection Time: 09/15/16  3:35 PM  Result Value Ref Range   Glucose-Capillary 118 (H) 65 - 99 mg/dL  Glucose, capillary   Collection Time: 09/15/16  8:03 PM  Result Value Ref Range   Glucose-Capillary 311 (H) 65 - 99 mg/dL   Comment 1 Notify RN    Comment 2 Document in Chart    Comment 3 Call MD NNP PA CNM   Glucose, capillary   Collection Time: 09/16/16  6:01 AM  Result Value Ref Range   Glucose-Capillary 160 (H)  65 - 99 mg/dL   Comment 1 Notify RN    Comment 2 Document in Chart       Medications:  Scheduled . docusate sodium  100 mg Oral BID  . glyBURIDE  5 mg Oral BID WC  . insulin aspart  0-14 Units Subcutaneous QID  . iron polysaccharides  150 mg Oral Daily  . prenatal multivitamin  1 tablet Oral Q1200  . sodium chloride flush  3 mL Intravenous Q12H   I have reviewed the patient's current medications.  ASSESSMENT: Patient Active Problem List   Diagnosis Date Noted  . [redacted] weeks gestation of pregnancy   . Vaginal bleeding in pregnancy, second trimester   . Vaginal bleeding in pregnancy 09/10/2016  . [redacted] weeks gestation of pregnancy   . Advanced maternal age in multigravida, second trimester 07/07/2016  . Hypertension complicating pregnancy 07/07/2016    PLAN:  Observation in hospital for recurrence of vaginal bleeding Scheryl DarterJames Jami Ohlin 09/16/2016,7:16 AM

## 2016-09-16 NOTE — Progress Notes (Signed)
Patient reported to Clinical research associatewriter that she is feeling "good' today and is hoping that she will be discharge home. Pt denies pain or vaginal bleed.

## 2016-09-17 LAB — GLUCOSE, CAPILLARY
GLUCOSE-CAPILLARY: 125 mg/dL — AB (ref 65–99)
GLUCOSE-CAPILLARY: 200 mg/dL — AB (ref 65–99)
GLUCOSE-CAPILLARY: 116 mg/dL — AB (ref 65–99)
GLUCOSE-CAPILLARY: 182 mg/dL — AB (ref 65–99)

## 2016-09-17 LAB — CULTURE, OB URINE: Culture: 10000 — AB

## 2016-09-17 LAB — OB RESULTS CONSOLE GBS: STREP GROUP B AG: POSITIVE

## 2016-09-17 MED ORDER — INSULIN STARTER KIT- SYRINGES (ENGLISH)
1.0000 | Freq: Once | Status: AC
  Administered 2016-09-17: 1
  Filled 2016-09-17: qty 1

## 2016-09-17 NOTE — Progress Notes (Signed)
Spoke with [name], Sports coachcase manager, about patient affording insulin without insurance. The [facility] Reli-on Novolin insulin is the most affordable ($25 per bottle) . We discussed 70/30 insulin, but it would be difficult to calculate dosage for a pregnant patient.  Novolin Reli-on NPH and Novolin Regular would be the best choice.  These would be $25 per bottle. Patient does not read or write. Staff RN would need to check patient on being able to draw up insulin and administer correctly.  Will continue to monitor situation.   Desiree MinceKendra Chalese Peach RN BSN CDE Diabetes Coordinator Pager: [phone]  8am-5pm

## 2016-09-17 NOTE — Progress Notes (Signed)
Patient self administered insulin and did very well.

## 2016-09-17 NOTE — Care Management Note (Signed)
Case Management Note  Patient Details  Name: [name] MRN: [id number] Date of Birth: [birth year]  Subjective/Objective:           25 + week GDM , no insurance     ; [place]       Expected Discharge Date:  09/17/16               Expected Discharge Plan:   09/18/16  In-House Referral:   FC;diabetic educator      Status of Service:    completed If discussed at Long Length of Stay Meetings, dates discussed:    Additional Comments: CM met with met regarding home medication needs.  Patient does not have insurance and CM talked to [employer]- [employer] worker, community and she will not qualify for regular Medicaid per Financial counselor which will not help with proscriptions.   Patient was given on 08/16/16 by [facility] diabetic educator in the [facility] clinic a CBG meter, lancets and strips.  CM went to the Center for [facility] and spoke to [facility] and she gave me to give patient 100 lancets and 100 test strips (True Track) that works with her meter at home.  Claiborne Billings also made patient an appointment in the clinic on 09/20/16 at 0740 am and this was given to patient and she and husband in agreement and verbalized understanding. CM spoke to physician and also Diabetic Educator regarding insulin and cost for patient.  CM can provide MATCH for patient for the 1st month for patient. After 1st month then patient will have to assume cost for insulin and syringes so best cost effective insulin for discharge will be best.    [facility] pharmacy [facility] [address]. Match sheet given to patient and patient's husband.  Please send prescriptions to [facility] on [address] or print them out and give to patient at discharge.  Yong Channel, RN 09/17/2016, 6:44 PM

## 2016-09-17 NOTE — Progress Notes (Signed)
  Patient ID: [name], female   DOB: 1970-01-19, 46 y.o.   MRN: [id number] [facility] COMPREHENSIVE PROGRESS NOTE  Waneda Klammer is a 46 y.o. G2P1001 at [redacted]w[redacted]d who is admitted for vaginal bleeding secondary to marginal abruption.   Fetal presentation is transverse. Length of Stay:  7  Days  Subjective: Pt without complaints this morning. No vaginal bleeding. + FM. No ut ctx   Vitals:  Blood pressure 96/61, pulse 81, temperature 98.5 F (36.9 C), temperature source Oral, resp. rate 18, height '5\' 6"'$  (1.676 m), weight 95.3 kg (210 lb), last menstrual period 04/10/2016, SpO2 100 %.   Physical Examination: Lungs clear Heart RRR Abd soft + BS gravid non tender Ext non tender  Fetal Monitoring:  + FHT's  Labs:  Results for orders placed or performed during the hospital encounter of 09/10/16 (from the past 24 hour(s))  Glucose, capillary   Collection Time: 09/16/16  2:59 PM  Result Value Ref Range   Glucose-Capillary 165 (H) 65 - 99 mg/dL  Glucose, capillary   Collection Time: 09/16/16  8:24 PM  Result Value Ref Range   Glucose-Capillary 211 (H) 65 - 99 mg/dL  Glucose, capillary   Collection Time: 09/17/16  6:07 AM  Result Value Ref Range   Glucose-Capillary 125 (H) 65 - 99 mg/dL   Comment 1 Notify RN    Comment 2 Document in Chart     Imaging Studies:       Medications:  Scheduled . aspirin EC  81 mg Oral Daily  . glyBURIDE  5 mg Oral BID WC  . insulin aspart  0-14 Units Subcutaneous QID  . insulin starter kit- syringes  1 kit Other Once  . iron polysaccharides  150 mg Oral Daily  . prenatal multivitamin  1 tablet Oral Q1200  . sodium chloride flush  3 mL Intravenous Q12H   I have reviewed the patient's current medications.  ASSESSMENT: IUP 25 3/7 weeks Marginal abruption Vaginal bleeding d/t above GDM, uncontrolled ? CHTN, no meds   PLAN: Stable. No more bleeding. Biggest issue is BS control. Will have DM see about insulin. Working with case  mFreight forwarderfor most cost effective treatment. Pt does not have insurance and does not qualify for pregnancy medicaid. Will try NPH 70/30 bid as this appears to be the most cost effective for pt. Hopefully home tomorrow if pt feels comfortable with insulin teaching Continue routine antenatal care.   MChancy Milroy9/10/2016,11:20 AM

## 2016-09-17 NOTE — Progress Notes (Signed)
Inpatient Diabetes Program Recommendations  Diabetes Treatment Program Recommendations  ADA Standards of Care 2018 Diabetes in Pregnancy Target Glucose Ranges:  Fasting: 60 - 90 mg/dL Preprandial: 60 - 105 mg/dL 1 hr postprandial: Less than 126m/dL (from first bite of meal) 2 hr postprandial: Less than 120 mg/dL (from first bite of meal)    Results for MRAMONITA, KOENIG(MRN [id number] as of 09/17/2016 08:51  Ref. Range 09/16/2016 06:01 09/16/2016 11:04 09/16/2016 14:59 09/16/2016 20:24 09/17/2016 06:07  Glucose-Capillary Latest Ref Range: 65 - 99 mg/dL 160 (H) 160 (H) 165 (H) 211 (H) 125 (H)   Review of Glycemic Control  Diabetes history: GDM Outpatient Diabetes medications:  Current orders for Inpatient glycemic control: Glyburide 5 mg BID, Novolog 0-14 units QID (fasting and 2 hour post prandial)  Inpatient Diabetes Program Recommendations: Insulin - Basal: Please consider ordering NPH 13 units BID. Correction (SSI): Please continue Novolog correction as ordered. Insulin - Meal Coverage: Please consider ordering Novolog 5 units TID with meals for meal coverage if patient eats at least 50% of meals. Oral Agents: If insulin is started as recommended, please discontinue Glyburide.  NOTE: In reviewing chart, noted patient seen BHarvie Bridge RD on 08/16/16. Noted patient speaks Hausa and can not read or write. Patient will need repeatedly consistent DM and insulin teaching. Ordered insulin starter kit and patient education by bedside RNs.  Thanks, MBarnie Alderman RN, MSN, CDE Diabetes Coordinator Inpatient Diabetes Program [phone](Team Pager from 8am to 5pm)

## 2016-09-17 NOTE — Progress Notes (Signed)
Met with patient and husband @ bedside and reviewed insulin administration using syringes. Patient and husband demonstrated putting air into vial and drawing up insulin appropriately. Patient and husband are willing to purchase insulin from [facility] as needed and reviewed insulin good for use for approx. 1 month from time of opening. Nurses, please continue insulin administration teaching.  Thank you, Nani Gasser. Carleta Woodrow, RN, MSN, CDE  Diabetes Coordinator Inpatient Glycemic Control Team Team Pager [phone] (8am-5pm) 09/17/2016 5:04 PM

## 2016-09-18 LAB — GLUCOSE, CAPILLARY
GLUCOSE-CAPILLARY: 139 mg/dL — AB (ref 65–99)
GLUCOSE-CAPILLARY: 225 mg/dL — AB (ref 65–99)
GLUCOSE-CAPILLARY: 230 mg/dL — AB (ref 65–99)
Glucose-Capillary: 128 mg/dL — ABNORMAL HIGH (ref 65–99)
Glucose-Capillary: 141 mg/dL — ABNORMAL HIGH (ref 65–99)

## 2016-09-18 MED ORDER — INSULIN REGULAR HUMAN 100 UNIT/ML IJ SOLN
11.0000 [IU] | Freq: Every day | INTRAMUSCULAR | Status: DC
  Filled 2016-09-18 (×9): qty 0.11

## 2016-09-18 MED ORDER — INSULIN NPH (HUMAN) (ISOPHANE) 100 UNIT/ML ~~LOC~~ SUSP
8.0000 [IU] | Freq: Every day | SUBCUTANEOUS | Status: DC
  Administered 2016-09-18: 8 [IU] via SUBCUTANEOUS
  Filled 2016-09-18: qty 10

## 2016-09-18 MED ORDER — INSULIN NPH (HUMAN) (ISOPHANE) 100 UNIT/ML ~~LOC~~ SUSP
20.0000 [IU] | Freq: Every day | SUBCUTANEOUS | Status: DC
  Administered 2016-09-19: 20 [IU] via SUBCUTANEOUS

## 2016-09-18 MED ORDER — INSULIN REGULAR HUMAN 100 UNIT/ML IJ SOLN: 15.0000 [IU] | Freq: Every day | INTRAMUSCULAR | Status: DC

## 2016-09-18 MED ORDER — INSULIN REGULAR HUMAN 100 UNIT/ML IJ SOLN
10.0000 [IU] | Freq: Every day | INTRAMUSCULAR | Status: DC
  Administered 2016-09-19: 10 [IU] via SUBCUTANEOUS

## 2016-09-18 MED ORDER — INSULIN ASPART 100 UNIT/ML ~~LOC~~ SOLN
8.0000 [IU] | Freq: Once | SUBCUTANEOUS | Status: AC
Start: 2016-09-18 — End: 2016-09-18
  Administered 2016-09-18: 8 [IU] via SUBCUTANEOUS

## 2016-09-18 MED ORDER — INSULIN NPH (HUMAN) (ISOPHANE) 100 UNIT/ML ~~LOC~~ SUSP: 29.0000 [IU] | Freq: Every day | SUBCUTANEOUS | Status: DC

## 2016-09-18 MED ORDER — INSULIN REGULAR HUMAN 100 UNIT/ML IJ SOLN
8.0000 [IU] | Freq: Every day | INTRAMUSCULAR | Status: DC
  Administered 2016-09-18: 8 [IU] via SUBCUTANEOUS

## 2016-09-18 MED ORDER — INSULIN NPH (HUMAN) (ISOPHANE) 100 UNIT/ML ~~LOC~~ SUSP
11.0000 [IU] | Freq: Every day | SUBCUTANEOUS | Status: DC
  Filled 2016-09-18: qty 10

## 2016-09-18 NOTE — Progress Notes (Signed)
Patient ID: [name], female   DOB: 12/07/1970, 46 y.o.   MRN: [id number] ACULTY PRACTICE ANTEPARTUM COMPREHENSIVE PROGRESS NOTE  [name] is a 46 y.o. G2P1001 at 6676w4d  who is admitted for vaginal bleeding secondary to marginal abruption.   Fetal presentation is transverse. Length of Stay:  8  Days  Subjective: No complaints. No bleeding. No ut ctx  + FM   Vitals:  Blood pressure (!) 124/57, pulse 81, temperature 98.7 F (37.1 C), temperature source Oral, resp. rate 18, height 5\' 6"  (1.676 m), weight 95.3 kg (210 lb), last menstrual period 04/10/2016, SpO2 99 %.   Physical Examination: Lungs clear  Heart RRR Abd soft + BS gravid non tender  Fetal Monitoring:  + FHT's  Labs:  Results for orders placed or performed during the hospital encounter of 09/10/16 (from the past 24 hour(s))  Glucose, capillary   Collection Time: 09/17/16  3:39 PM  Result Value Ref Range   Glucose-Capillary 116 (H) 65 - 99 mg/dL   Comment 1 Notify RN    Comment 2 Document in Chart   OB RESULT CONSOLE Group B Strep   Collection Time: 09/17/16  3:42 PM  Result Value Ref Range   GBS Positive   Glucose, capillary   Collection Time: 09/17/16  9:57 PM  Result Value Ref Range   Glucose-Capillary 182 (H) 65 - 99 mg/dL  Glucose, capillary   Collection Time: 09/18/16  4:53 AM  Result Value Ref Range   Glucose-Capillary 128 (H) 65 - 99 mg/dL    Imaging Studies:       Medications:  Scheduled . aspirin EC  81 mg Oral Daily  . insulin aspart  0-14 Units Subcutaneous QID  . iron polysaccharides  150 mg Oral Daily  . prenatal multivitamin  1 tablet Oral Q1200  . sodium chloride flush  3 mL Intravenous Q12H   I have reviewed the patient's current medications.  ASSESSMENT: IUP 25 4/7 weeks Marginal abruption Vaginal bleeding secondary to above GDM, uncontrolled ? CHTN, no meds  PLAN: Stable. Will start NPH/Reg insulin. Nursing to help with drawing.  Hopefully discharge home tomorrow.  Continue  routine antenatal care.   Hermina StaggersMichael L Taevin Mcferran 09/18/2016,11:52 AM

## 2016-09-19 LAB — TYPE AND SCREEN
ABO/RH(D): O POS
Antibody Screen: NEGATIVE

## 2016-09-19 LAB — GLUCOSE, CAPILLARY
GLUCOSE-CAPILLARY: 129 mg/dL — AB (ref 65–99)
Glucose-Capillary: 130 mg/dL — ABNORMAL HIGH (ref 65–99)

## 2016-09-19 MED ORDER — "INSULIN SYRINGE 31G X 5/16"" 1 ML MISC": 1.0000 [IU] | Freq: Two times a day (BID) | 11 refills | Status: DC

## 2016-09-19 MED ORDER — INSULIN NPH (HUMAN) (ISOPHANE) 100 UNIT/ML ~~LOC~~ SUSP: 20.0000 [IU] | Freq: Every day | SUBCUTANEOUS | 11 refills | Status: DC

## 2016-09-19 MED ORDER — DOCUSATE SODIUM 100 MG PO CAPS: 100.0000 mg | ORAL_CAPSULE | Freq: Two times a day (BID) | ORAL | 3 refills | Status: DC | PRN

## 2016-09-19 MED ORDER — INSULIN REGULAR HUMAN 100 UNIT/ML IJ SOLN: 8.0000 [IU] | Freq: Every day | INTRAMUSCULAR | 11 refills | Status: DC

## 2016-09-19 MED ORDER — INSULIN REGULAR HUMAN 100 UNIT/ML IJ SOLN: 10.0000 [IU] | Freq: Every day | INTRAMUSCULAR | 11 refills | Status: DC

## 2016-09-19 MED ORDER — INSULIN NPH (HUMAN) (ISOPHANE) 100 UNIT/ML ~~LOC~~ SUSP: 8.0000 [IU] | Freq: Every day | SUBCUTANEOUS | 11 refills | Status: DC

## 2016-09-19 NOTE — Progress Notes (Signed)
Pt discharged with printed instructions. Patient and husband verbalized an understanding. No concerns noted. Desiree DaneERRI L Delainy Mcelhiney, RN

## 2016-09-19 NOTE — Care Management Note (Signed)
Case Management Note  Patient Details  Name: [name] MRN: [id number] Date of Birth: 02/05/1970   Additional Comments:  CM followed up with patient this am prior to discharge.  [facility] [address] Patient's preferred pharmacy and they did not have patient's NPH insulin in stock.  [facility] [address] [place] # [phone] and spoke to pharmacist Melody and she stated that they had NPH and Regular insulin in stock.  Patient/husband has address of this pharmacy and phone number and MATCH letter to take to obtain medicine.  No other needs at this time.   Geoffery LyonsGaines, Raushanah Osmundson Brown, RN 09/19/2016, 10:57 AM

## 2016-09-19 NOTE — Discharge Summary (Signed)
Physician Discharge Summary  Patient ID: [name] MRN: [id number] DOB/AGE: May 30, 1970 46 y.o.  Admit date: 09/10/2016 Discharge date: 09/19/2016  Admission Diagnoses: IUP 24 weeks                                         Vaginal bleeding  Discharge Diagnoses: IUP 25 5/7 weeks                                         Marginal sinus abruption                                         GDM   Discharged Condition: good  Hospital Course: Pt was admitted for above Dx. She was observed and had no further bleeding. Received BMZ x 2. Known GDM and was taking Diabeta. However BS remained elevated despite what was felt to have been adequate time for steroid effect to resolved. Pt was started on insulin. Pt received diabetic education while in hospital. She with her husband were able to demonstrate properly dosing and administration of her insulin.   SW consult assisted with medications as pt has no insurance coverage and does not qualify for Medicaid.    Consults: None  Significant Diagnostic Studies: U/S  Treatments: steroids: BMZ and insulin: regular and NPH  Discharge Exam: Blood pressure 123/73, pulse 79, temperature 98.2 F (36.8 C), temperature source Oral, resp. rate 18, height  (1.676 m), weight 95.3 kg (210 lb), last menstrual period 04/10/2016, SpO2 99 %.   Lungs clear Heart RRR Abd soft + bs Gravid Ext non tender    Disposition: 01-Home or Self Care  Discharge Instructions    Discharge diet:  No restrictions    Complete by:  As directed    Do not have sex or do anything that might make you have an orgasm    Complete by:  As directed    Notify physician for a general feeling that "something is not right"    Complete by:  As directed    Notify physician for increase or change in vaginal discharge    Complete by:  As directed    Notify physician for intestinal cramps, with or without diarrhea, sometimes described as "gas pain"    Complete by:  As directed    Notify  physician for leaking of fluid    Complete by:  As directed    Notify physician for low, dull backache, unrelieved by heat or Tylenol    Complete by:  As directed    Notify physician for menstrual like cramps    Complete by:  As directed    Notify physician for pelvic pressure    Complete by:  As directed    Notify physician for uterine contractions.  These may be painless and feel like the uterus is tightening or the baby is  "balling up"    Complete by:  As directed    Notify physician for vaginal bleeding    Complete by:  As directed    PRETERM LABOR:  Includes any of the follwing symptoms that occur between 20 - [redacted] weeks gestation.  If these symptoms are not stopped, preterm labor can result in  preterm delivery, placing your baby at risk    Complete by:  As directed      Allergies as of 09/19/2016   No Known Allergies     Medication List    STOP taking these medications   acetaminophen 325 MG tablet Commonly known as:  TYLENOL   glyBURIDE 2.5 MG tablet Commonly known as:  DIABETA     TAKE these medications   docusate sodium 100 MG capsule Commonly known as:  COLACE Take 1 capsule (100 mg total) by mouth 2 (two) times daily as needed for mild constipation.   insulin NPH Human 100 UNIT/ML injection Commonly known as:  HUMULIN N,NOVOLIN N Inject 0.08 mLs (8 Units total) into the skin daily before supper.   insulin NPH Human 100 UNIT/ML injection Commonly known as:  HUMULIN N,NOVOLIN N Inject 0.2 mLs (20 Units total) into the skin daily before breakfast.   insulin regular 100 units/mL injection Commonly known as:  NOVOLIN R,HUMULIN R Inject 0.08 mLs (8 Units total) into the skin daily before supper.   insulin regular 100 units/mL injection Commonly known as:  NOVOLIN R,HUMULIN R Inject 0.1 mLs (10 Units total) into the skin daily before breakfast.   iron polysaccharides 150 MG capsule Commonly known as:  NIFEREX Take 1 capsule (150 mg total) by mouth daily.    multivitamin-prenatal 27-0.8 MG Tabs tablet Take 1 tablet by mouth daily at 12 noon.            Discharge Care Instructions        Start     Ordered   09/20/16 0000  insulin NPH Human (HUMULIN N,NOVOLIN N) 100 UNIT/ML injection  Daily before breakfast     09/19/16 0845   09/20/16 0000  insulin regular (NOVOLIN R,HUMULIN R) 100 units/mL injection  Daily before breakfast     09/19/16 0845   09/19/16 0000  docusate sodium (COLACE) 100 MG capsule  2 times daily PRN     09/19/16 0845   09/19/16 0000  insulin NPH Human (HUMULIN N,NOVOLIN N) 100 UNIT/ML injection  Daily before supper     09/19/16 0845   09/19/16 0000  insulin regular (NOVOLIN R,HUMULIN R) 100 units/mL injection  Daily before supper     09/19/16 0845   09/19/16 0000  PRETERM LABOR:  Includes any of the follwing symptoms that occur between 20 - [redacted] weeks gestation.  If these symptoms are not stopped, preterm labor can result in preterm delivery, placing your baby at risk  (Preterm Labor Notify MD)     09/19/16 0845   09/19/16 0000  Notify physician for menstrual like cramps  (Preterm Labor Notify MD)     09/19/16 0845   09/19/16 0000  Notify physician for uterine contractions.  These may be painless and feel like the uterus is tightening or the baby is  "balling up"  (Preterm Labor Notify MD)     09/19/16 0845   09/19/16 0000  Notify physician for low, dull backache, unrelieved by heat or Tylenol  (Preterm Labor Notify MD)     09/19/16 0845   09/19/16 0000  Notify physician for intestinal cramps, with or without diarrhea, sometimes described as "gas pain"  (Preterm Labor Notify MD)     09/19/16 0845   09/19/16 0000  Notify physician for pelvic pressure  (Preterm Labor Notify MD)     09/19/16 0845   09/19/16 0000  Notify physician for increase or change in vaginal discharge  (Preterm Labor Notify MD)  09/19/16 0845   09/19/16 0000  Notify physician for vaginal bleeding  (Preterm Labor Notify MD)     09/19/16 0845    09/19/16 0000  Notify physician for a general feeling that "something is not right"  (Preterm Labor Notify MD)     09/19/16 0845   09/19/16 0000  Notify physician for leaking of fluid  (Preterm Labor Notify MD)     09/19/16 0845   09/19/16 0000  Do not have sex or do anything that might make you have an orgasm     09/19/16 0845   09/19/16 0000  Discharge diet:  No restrictions     09/19/16 0845   09/17/16 0000  OB RESULT CONSOLE Group B Strep    Comments:  This external order was created through the Results Console.   09/17/16 1542     Follow-up Information    [facility].   Why:  Pt already has appt for tomorrow. Contact information: [address] [place] [id number] [phone]          Signed: Hermina StaggersMichael L Jazzalyn Loewenstein 09/19/2016, 8:48 AM

## 2016-09-20 ENCOUNTER — Ambulatory Visit (INDEPENDENT_AMBULATORY_CARE_PROVIDER_SITE_OTHER): Payer: Self-pay | Admitting: Obstetrics & Gynecology

## 2016-09-20 ENCOUNTER — Encounter: Payer: Self-pay | Attending: Obstetrics and Gynecology | Admitting: *Deleted

## 2016-09-20 ENCOUNTER — Encounter: Payer: Self-pay | Admitting: Obstetrics & Gynecology

## 2016-09-20 ENCOUNTER — Ambulatory Visit: Payer: Self-pay | Admitting: *Deleted

## 2016-09-20 VITALS — BP 127/75 | HR 91 | Wt 205.3 lb

## 2016-09-20 DIAGNOSIS — O162 Unspecified maternal hypertension, second trimester: Secondary | ICD-10-CM

## 2016-09-20 DIAGNOSIS — O24419 Gestational diabetes mellitus in pregnancy, unspecified control: Secondary | ICD-10-CM

## 2016-09-20 DIAGNOSIS — O24414 Gestational diabetes mellitus in pregnancy, insulin controlled: Secondary | ICD-10-CM

## 2016-09-20 DIAGNOSIS — R7302 Impaired glucose tolerance (oral): Secondary | ICD-10-CM | POA: Insufficient documentation

## 2016-09-20 DIAGNOSIS — O285 Abnormal chromosomal and genetic finding on antenatal screening of mother: Secondary | ICD-10-CM | POA: Insufficient documentation

## 2016-09-20 DIAGNOSIS — O169 Unspecified maternal hypertension, unspecified trimester: Secondary | ICD-10-CM

## 2016-09-20 DIAGNOSIS — Z713 Dietary counseling and surveillance: Secondary | ICD-10-CM | POA: Insufficient documentation

## 2016-09-20 DIAGNOSIS — O0992 Supervision of high risk pregnancy, unspecified, second trimester: Secondary | ICD-10-CM

## 2016-09-20 DIAGNOSIS — O0990 Supervision of high risk pregnancy, unspecified, unspecified trimester: Secondary | ICD-10-CM

## 2016-09-20 DIAGNOSIS — Z23 Encounter for immunization: Secondary | ICD-10-CM

## 2016-09-20 LAB — UIFE/LIGHT CHAINS/TP QN, 24-HR UR
% BETA, Urine: 0 %
ALBUMIN, U: 100 %
ALPHA 1 URINE: 0 %
Alpha 2, Urine: 0 %
FREE KAPPA/LAMBDA RATIO: 11.8 — AB (ref 2.04–10.37)
Free Lambda Lt Chains,Ur: 1 mg/L (ref 0.24–6.66)
Free Lt Chn Excr Rate: 11.8 mg/L (ref 1.35–24.19)
GAMMA GLOBULIN URINE: 0 %
TOTAL PROTEIN, URINE-UPE24: 8.5 mg/dL
TOTAL PROTEIN, URINE-UR/DAY: 272 mg/(24.h) — AB (ref 30–150)
Time: 24 hours
Total Volume: 3200

## 2016-09-20 LAB — POCT URINALYSIS DIP (DEVICE)
Bilirubin Urine: NEGATIVE
Glucose, UA: 500 mg/dL — AB
HGB URINE DIPSTICK: NEGATIVE
Ketones, ur: NEGATIVE mg/dL
Leukocytes, UA: NEGATIVE
NITRITE: NEGATIVE
PH: 7 (ref 5.0–8.0)
PROTEIN: NEGATIVE mg/dL
Specific Gravity, Urine: 1.01 (ref 1.005–1.030)
Urobilinogen, UA: 0.2 mg/dL (ref 0.0–1.0)

## 2016-09-20 NOTE — Progress Notes (Signed)
   PRENATAL VISIT NOTE  Subjective:  [name] is a 46 y.o. G2P1000 at 2346w6d being seen today for ongoing prenatal care.  [facility] for placental abruption.  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  She is currently monitored for the following issues for this high-risk pregnancy and has Advanced maternal age in multigravida, second trimester; Hypertension complicating pregnancy; Placenta abruptio, antepartum; GBS (group B streptococcus) UTI complicating pregnancy; early Dx GDM (early 2nd trimester); Supervision of high-risk pregnancy; and Abnormal genetic test in pregnancy on her problem list.  Patient reports no complaints.  Contractions: Not present. Vag. Bleeding: None.  Movement: Present. Denies leaking of fluid.   The following portions of the patient's history were reviewed and updated as appropriate: allergies, current medications, past family history, past medical history, past social history, past surgical history and problem list. Problem list updated.  Objective:   Vitals:   09/20/16 0838  BP: 127/75  Pulse: 91  Weight: 205 lb 4.8 oz (93.1 kg)    Fetal Status: Fetal Heart Rate (bpm): 134   Movement: Present     General:  Alert, oriented and cooperative. Patient is in no acute distress.  Skin: Skin is warm and dry. No rash noted.   Cardiovascular: Normal heart rate noted  Respiratory: Normal respiratory effort, no problems with respiration noted  Abdomen: Soft, gravid, appropriate for gestational age.  Pain/Pressure: Absent     Pelvic: Cervical exam deferred        Extremities: Normal range of motion.  Edema: None  Mental Status:  Normal mood and affect. Normal behavior. Normal judgment and thought content.   Assessment and Plan:  Pregnancy: G2P1000 at 6446w6d  1. Supervision of high risk pregnancy, antepartum - Flu Vaccine QUAD 36+ mos IM  2. Hypertension affecting pregnancy, antepartum Normotensive today.     3. 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  Pt informed today and does not want any more testing.    4. early Dx GDM (early 2nd trimester) Pt has been on post hospital regimen for one day.  She did not take her insulin this morning because she was confused.  Bev (diabetic educator) spent 45 minutes with patient.  Pt is illiterate.  We will color code her paper so she understands better when to take her CBGs.  In person interpreter used and is necessary at every visit given literacy.  See Bev's note for further details and recommendations.    5.  Placental Abruption No bleeding since discharge.  No contractions. Good fetal Movement.  Has follow up US for anatomy completion.  Will request MFM to suggest fetal testing due to abruption and in late 2nd trimester.    Preterm labor symptoms and general obstetric precautions including but not limited to vaginal bleeding, contractions, leaking of fluid and fetal movement were reviewed in detail with the patient. Please refer to After Visit Summary for other counseling recommendations.  Return in about 1 week (around 09/27/2016).   Elsie LincolnKelly Joylene Wescott, MD

## 2016-09-20 NOTE — Progress Notes (Signed)
  Patient was seen on 913/2018 for Gestational Diabetes self-management. Patient here with [employer] interpretor. Patient states no education, unable to read or write. She was discharged from hospital yesterday with instructions to mix NPH and Regular insulin twice daily per below: Before breakfast: 20 units NPH and 10 units Regular Before supper: 8 units NPH and 8 units Regular  She is seeing Dr. Penne LashLeggett today, I was asked to work with her as well as she has questions about insulin measurement and how to give it. She had drawn up the AM dose but was afaid to take it at home. Dr. Penne LashLeggett adjusted the dose for today since she had already eaten and I assisted her in drawing it up for today. She was instructed to use the pre-filled syringe tomorrow AM and to keep in in air conditioning, not to let it get hot in the car.  I reviewed insulin mixing instructions she brought with her and added that she needs to add air to bottle before drawing up so vacuum inside will not increase over time. She expressed good verbal understanding.  She also had questions on how to complete BG Log Sheet. I marked the sheet with a different color for each meal so she could see the relationship of pre-meal and post meal numbers. She again expressed verbal understanding.  I have asked to see her back in one week when she sees the MD again so we can complete her diabetes education specifically about her food choices.  Plan:  Check BG before breakfast and 2 hours after first bite of breakfast, lunch and dinner as directed by MD  Bring Log Book to every medical appointment   Take medication as directed by MD  Patient instructed to monitor glucose levels: FBS: 60 - 95 mg/dl 2 hour: <109<120 mg/dl  Patient received the following handouts:  BG Log Sheet with meals marked as different colored lines  Patient will be seen in one week to continue diabetes education

## 2016-09-21 ENCOUNTER — Other Ambulatory Visit (HOSPITAL_COMMUNITY): Payer: Self-pay | Admitting: Obstetrics and Gynecology

## 2016-09-21 ENCOUNTER — Other Ambulatory Visit (HOSPITAL_COMMUNITY): Payer: Self-pay | Admitting: *Deleted

## 2016-09-21 ENCOUNTER — Ambulatory Visit (HOSPITAL_COMMUNITY)
Admission: RE | Admit: 2016-09-21 | Discharge: 2016-09-21 | Disposition: A | Discharge status: Home / Self Care | Payer: Self-pay | Source: Ambulatory Visit | Attending: Obstetrics & Gynecology | Admitting: Obstetrics & Gynecology

## 2016-09-21 ENCOUNTER — Other Ambulatory Visit: Payer: Self-pay

## 2016-09-21 ENCOUNTER — Encounter (HOSPITAL_COMMUNITY): Payer: Self-pay

## 2016-09-21 DIAGNOSIS — O24414 Gestational diabetes mellitus in pregnancy, insulin controlled: Secondary | ICD-10-CM | POA: Insufficient documentation

## 2016-09-21 DIAGNOSIS — Z3A26 26 weeks gestation of pregnancy: Secondary | ICD-10-CM

## 2016-09-21 DIAGNOSIS — O09522 Supervision of elderly multigravida, second trimester: Secondary | ICD-10-CM | POA: Insufficient documentation

## 2016-09-21 DIAGNOSIS — Z362 Encounter for other antenatal screening follow-up: Secondary | ICD-10-CM | POA: Insufficient documentation

## 2016-09-21 DIAGNOSIS — O289 Unspecified abnormal findings on antenatal screening of mother: Secondary | ICD-10-CM | POA: Insufficient documentation

## 2016-09-21 DIAGNOSIS — O281 Abnormal biochemical finding on antenatal screening of mother: Secondary | ICD-10-CM

## 2016-09-21 DIAGNOSIS — O24410 Gestational diabetes mellitus in pregnancy, diet controlled: Secondary | ICD-10-CM

## 2016-09-21 DIAGNOSIS — O10919 Unspecified pre-existing hypertension complicating pregnancy, unspecified trimester: Secondary | ICD-10-CM

## 2016-09-21 DIAGNOSIS — O10012 Pre-existing essential hypertension complicating pregnancy, second trimester: Secondary | ICD-10-CM | POA: Insufficient documentation

## 2016-09-21 DIAGNOSIS — O99212 Obesity complicating pregnancy, second trimester: Secondary | ICD-10-CM | POA: Insufficient documentation

## 2016-09-21 DIAGNOSIS — Z315 Encounter for genetic counseling: Secondary | ICD-10-CM | POA: Insufficient documentation

## 2016-09-27 ENCOUNTER — Encounter: Payer: Self-pay | Admitting: *Deleted

## 2016-09-27 ENCOUNTER — Ambulatory Visit: Payer: Self-pay | Admitting: *Deleted

## 2016-09-27 ENCOUNTER — Ambulatory Visit (INDEPENDENT_AMBULATORY_CARE_PROVIDER_SITE_OTHER): Payer: Self-pay | Admitting: Obstetrics & Gynecology

## 2016-09-27 VITALS — BP 127/59 | HR 85 | Wt 208.7 lb

## 2016-09-27 DIAGNOSIS — O169 Unspecified maternal hypertension, unspecified trimester: Secondary | ICD-10-CM

## 2016-09-27 DIAGNOSIS — O4592 Premature separation of placenta, unspecified, second trimester: Secondary | ICD-10-CM

## 2016-09-27 DIAGNOSIS — O4590 Premature separation of placenta, unspecified, unspecified trimester: Secondary | ICD-10-CM

## 2016-09-27 DIAGNOSIS — O24414 Gestational diabetes mellitus in pregnancy, insulin controlled: Secondary | ICD-10-CM

## 2016-09-27 DIAGNOSIS — O162 Unspecified maternal hypertension, second trimester: Secondary | ICD-10-CM

## 2016-09-27 DIAGNOSIS — O2340 Unspecified infection of urinary tract in pregnancy, unspecified trimester: Secondary | ICD-10-CM

## 2016-09-27 DIAGNOSIS — B951 Streptococcus, group B, as the cause of diseases classified elsewhere: Secondary | ICD-10-CM

## 2016-09-27 DIAGNOSIS — O2342 Unspecified infection of urinary tract in pregnancy, second trimester: Secondary | ICD-10-CM

## 2016-09-27 MED ORDER — INSULIN NPH (HUMAN) (ISOPHANE) 100 UNIT/ML ~~LOC~~ SUSP: 26.0000 [IU] | Freq: Every day | SUBCUTANEOUS | 11 refills | Status: DC

## 2016-09-27 MED ORDER — INSULIN REGULAR HUMAN 100 UNIT/ML IJ SOLN: 12.0000 [IU] | Freq: Every day | INTRAMUSCULAR | 11 refills | Status: DC

## 2016-09-27 MED ORDER — INSULIN NPH (HUMAN) (ISOPHANE) 100 UNIT/ML ~~LOC~~ SUSP: 10.0000 [IU] | Freq: Every day | SUBCUTANEOUS | 11 refills | Status: DC

## 2016-09-27 NOTE — Progress Notes (Signed)
  Patient was seen on 09/27/2016 for Gestational Diabetes self-management follow up visit. EDD 12/28/2016, at 26 weeks and 6 days. Patient here with [employer] interpretor. Patient states no education, unable to read or write. She has been able to record her BG on Log Sheet and states she is comfortable with giving her insulin shots. Today we will review the nutrition component of her diabetes education. She provided me with a Diet History which shows 3 meals and 3 snacks at set times; 6 AM, 8 AM, 1 PM, 4 PM and 6 PM, occasionally a snack at 8 PM.    Describes the effects of carbohydrates on blood glucose levels  Demonstrates ability to create a balanced meal plan  Demonstrates carbohydrate counting by [employer]  Plan:  Continue to check BG before breakfast and 2 hours after first bite of breakfast, lunch and dinner as directed by MD  Continue to bring Log Sheet to every medical appointment   Aim for 2-3 Carb Choices (starch, fruit or milk) at each meal and 1 at each snack Include protein with each meal and snack Take medication as directed by MD   Patient will be seen for follow-up in one month

## 2016-09-27 NOTE — Progress Notes (Signed)
     PRENATAL VISIT NOTE  Subjective:  [name] is a 46 y.o. G2P1000 at 100w6d being seen today for ongoing prenatal care.  She is currently monitored for the following issues for this high-risk pregnancy and has Advanced maternal age in multigravida, second trimester; Hypertension complicating pregnancy; Placenta abruptio, antepartum; GBS (group B streptococcus) UTI complicating pregnancy; early Dx GDM (early 2nd trimester); Supervision of high-risk pregnancy; and Abnormal genetic test in pregnancy on her problem list.  Patient reports no complaints.  Contractions: Not present. Vag. Bleeding: None.  Movement: Present. Denies leaking of fluid.   The following portions of the patient's history were reviewed and updated as appropriate: allergies, current medications, past family history, past medical history, past social history, past surgical history and problem list. Problem list updated.  Objective:   Vitals:   09/27/16 0933  BP: (!) 127/59  Pulse: 85  Weight: 208 lb 11.2 oz (94.7 kg)    Fetal Status: Fetal Heart Rate (bpm): 145   Movement: Present     General:  Alert, oriented and cooperative. Patient is in no acute distress.  Skin: Skin is warm and dry. No rash noted.   Cardiovascular: Normal heart rate noted  Respiratory: Normal respiratory effort, no problems with respiration noted  Abdomen: Soft, gravid, appropriate for gestational age.  Pain/Pressure: Absent     Pelvic: Cervical exam deferred        Extremities: Normal range of motion.  Edema: None  Mental Status:  Normal mood and affect. Normal behavior. Normal judgment and thought content.   Assessment and Plan:  Pregnancy: G2P1000 at [redacted]w[redacted]d  1. Placenta abruptio, antepartum No bleeding.  MFM recommends growth [place] which is scheduled.  2. Hypertension affecting pregnancy, antepartum BP nml  3. Group B Streptococcus urinary tract infection affecting pregnancy, antepartum TOC next visit.  4.  Diabetes in  pregnancy Reviewed CBGs in detail.  Increased insulin to cover (see log picture above) NPH 26 in am and 10 in pm Regular 12 in am and 12 in pm  Preterm labor symptoms and general obstetric precautions including but not limited to vaginal bleeding, contractions, leaking of fluid and fetal movement were reviewed in detail with the patient. Please refer to After Visit Summary for other counseling recommendations.   Return in 1 week due to insulin changes.    Elsie Lincoln, MD

## 2016-10-02 ENCOUNTER — Ambulatory Visit: Payer: Self-pay | Admitting: *Deleted

## 2016-10-02 ENCOUNTER — Other Ambulatory Visit: Payer: Self-pay

## 2016-10-05 ENCOUNTER — Ambulatory Visit (INDEPENDENT_AMBULATORY_CARE_PROVIDER_SITE_OTHER): Payer: Self-pay | Admitting: Obstetrics & Gynecology

## 2016-10-05 VITALS — BP 122/70 | HR 94 | Wt 210.6 lb

## 2016-10-05 DIAGNOSIS — O0993 Supervision of high risk pregnancy, unspecified, third trimester: Secondary | ICD-10-CM

## 2016-10-05 DIAGNOSIS — O0990 Supervision of high risk pregnancy, unspecified, unspecified trimester: Secondary | ICD-10-CM

## 2016-10-05 DIAGNOSIS — O24419 Gestational diabetes mellitus in pregnancy, unspecified control: Secondary | ICD-10-CM

## 2016-10-05 DIAGNOSIS — Z23 Encounter for immunization: Secondary | ICD-10-CM

## 2016-10-05 DIAGNOSIS — O163 Unspecified maternal hypertension, third trimester: Secondary | ICD-10-CM

## 2016-10-05 LAB — GLUCOSE, CAPILLARY: GLUCOSE-CAPILLARY: 151 mg/dL — AB (ref 65–99)

## 2016-10-05 MED ORDER — INSULIN REGULAR HUMAN 100 UNIT/ML IJ SOLN: INTRAMUSCULAR | 11 refills | Status: DC

## 2016-10-05 MED ORDER — INSULIN NPH (HUMAN) (ISOPHANE) 100 UNIT/ML ~~LOC~~ SUSP: SUBCUTANEOUS | 11 refills | Status: DC

## 2016-10-05 NOTE — Patient Instructions (Signed)
In the morning:  Novolin Regular (clear) 15 units and Novolin NPH (cloudy) 30 units. You can mix these two.  In the nighttime: Novolin Regular (clear) 20 units before dinner and NPH (cloudy)15 units at bedtime. Take these separately.   Return to clinic for any scheduled appointments or for any gynecologic concerns as needed.

## 2016-10-05 NOTE — Progress Notes (Signed)
   PRENATAL VISIT NOTE  Subjective:  [name] is a 46 y.o. G2P1000 at [redacted]w[redacted]d being seen today for ongoing prenatal care. Due to language barrier, an interpreter was present during the history-taking and subsequent discussion (and for part of the physical exam) with this patient.  She is currently monitored for the following issues for this high-risk pregnancy and has Advanced maternal age in multigravida, second trimester; Hypertension complicating pregnancy; Placenta abruptio, antepartum; GBS (group B streptococcus) UTI complicating pregnancy; early Dx GDM (early 2nd trimester); Supervision of high-risk pregnancy; and Abnormal genetic test in pregnancy on her problem list.  Patient reports no complaints.  Contractions: Not present. Vag. Bleeding: None.  Movement: Present. Denies leaking of fluid.   The following portions of the patient's history were reviewed and updated as appropriate: allergies, current medications, past family history, past medical history, past social history, past surgical history and problem list. Problem list updated.  Objective:   Vitals:   10/05/16 1117  BP: 122/70  Pulse: 94  Weight: 210 lb 9.6 oz (95.5 kg)    Fetal Status: Fetal Heart Rate (bpm): 147   Movement: Present     General:  Alert, oriented and cooperative. Patient is in no acute distress.  Skin: Skin is warm and dry. No rash noted.   Cardiovascular: Normal heart rate noted  Respiratory: Normal respiratory effort, no problems with respiration noted  Abdomen: Soft, gravid, appropriate for gestational age.  Pain/Pressure: Absent     Pelvic: Cervical exam deferred        Extremities: Normal range of motion.     Mental Status:  Normal mood and affect. Normal behavior. Normal judgment and thought content.      Assessment and Plan:  Pregnancy: G2P1000 at [redacted]w[redacted]d  1. Early Dx GDM (early 2nd trimester) Needs adjustment of insulin across the board Changed regimen to Novolin Reg 15 in AM, 20 in  PM Novolin NPH 30 units in am, 15 units in PM Growth scans scheduled. Will start antenatal testing at 32 weeks or earlier if needed.  2. Hypertension affecting pregnancy in third trimester Stable BP.   3. Supervision of high risk pregnancy, antepartum Third trimester. - CBC - RPR - HIV antibody - Tdap vaccine greater than or equal to 7yo IM Preterm labor symptoms and general obstetric precautions including but not limited to vaginal bleeding, contractions, leaking of fluid and fetal movement were reviewed in detail with the patient. Please refer to After Visit Summary for other counseling recommendations.  Return in about 1 week (around 10/12/2016) for OB Visit (HOB).   Jaynie Collins, MD

## 2016-10-06 LAB — HIV ANTIBODY (ROUTINE TESTING W REFLEX): HIV Screen 4th Generation wRfx: NONREACTIVE

## 2016-10-06 LAB — CBC
Hematocrit: 34.4 % (ref 34.0–46.6)
Hemoglobin: 11.4 g/dL (ref 11.1–15.9)
MCH: 25.3 pg — AB (ref 26.6–33.0)
MCHC: 33.1 g/dL (ref 31.5–35.7)
MCV: 76 fL — AB (ref 79–97)
PLATELETS: 303 10*3/uL (ref 150–379)
RBC: 4.51 x10E6/uL (ref 3.77–5.28)
RDW: 16.2 % — AB (ref 12.3–15.4)
WBC: 8.3 10*3/uL (ref 3.4–10.8)

## 2016-10-06 LAB — RPR: RPR Ser Ql: NONREACTIVE

## 2016-10-11 ENCOUNTER — Ambulatory Visit (INDEPENDENT_AMBULATORY_CARE_PROVIDER_SITE_OTHER): Payer: Self-pay | Admitting: Obstetrics & Gynecology

## 2016-10-11 ENCOUNTER — Encounter: Payer: Self-pay | Admitting: Obstetrics & Gynecology

## 2016-10-11 VITALS — BP 126/81 | HR 93 | Wt 211.4 lb

## 2016-10-11 DIAGNOSIS — O162 Unspecified maternal hypertension, second trimester: Secondary | ICD-10-CM

## 2016-10-11 DIAGNOSIS — O24419 Gestational diabetes mellitus in pregnancy, unspecified control: Secondary | ICD-10-CM

## 2016-10-11 DIAGNOSIS — O285 Abnormal chromosomal and genetic finding on antenatal screening of mother: Secondary | ICD-10-CM

## 2016-10-11 DIAGNOSIS — O09522 Supervision of elderly multigravida, second trimester: Secondary | ICD-10-CM

## 2016-10-11 DIAGNOSIS — O0992 Supervision of high risk pregnancy, unspecified, second trimester: Secondary | ICD-10-CM

## 2016-10-11 LAB — POCT URINALYSIS DIP (DEVICE)
BILIRUBIN URINE: NEGATIVE
KETONES UR: NEGATIVE mg/dL
LEUKOCYTES UA: NEGATIVE
Nitrite: NEGATIVE
PROTEIN: NEGATIVE mg/dL
SPECIFIC GRAVITY, URINE: 1.02 (ref 1.005–1.030)
Urobilinogen, UA: 0.2 mg/dL (ref 0.0–1.0)
pH: 7 (ref 5.0–8.0)

## 2016-10-11 MED ORDER — INSULIN NPH (HUMAN) (ISOPHANE) 100 UNIT/ML ~~LOC~~ SUSP: SUBCUTANEOUS | 11 refills | Status: DC

## 2016-10-11 NOTE — Progress Notes (Signed)
   PRENATAL VISIT NOTE  Subjective:  [name] is a 46 y.o. G2P1000 at [redacted]w[redacted]d being seen today for ongoing prenatal care.  She is currently monitored for the following issues for this high-risk pregnancy and has Advanced maternal age in multigravida, second trimester; Hypertension complicating pregnancy; Placenta abruptio, antepartum; GBS (group B streptococcus) UTI complicating pregnancy; early Dx GDM (early 2nd trimester); Supervision of high-risk pregnancy; and Abnormal genetic test in pregnancy on her problem list.  Patient reports no complaints.  Contractions: Not present. Vag. Bleeding: None.  Movement: Present. Denies leaking of fluid.   The following portions of the patient's history were reviewed and updated as appropriate: allergies, current medications, past family history, past medical history, past social history, past surgical history and problem list. Problem list updated.  Objective:   Vitals:   10/11/16 0954  BP: 126/81  Pulse: 93  Weight: 211 lb 6.4 oz (95.9 kg)    Fetal Status: Fetal Heart Rate (bpm): 152 Fundal Height: 32 cm Movement: Present     General:  Alert, oriented and cooperative. Patient is in no acute distress.  Skin: Skin is warm and dry. No rash noted.   Cardiovascular: Normal heart rate noted  Respiratory: Normal respiratory effort, no problems with respiration noted  Abdomen: Soft, gravid, appropriate for gestational age.  Pain/Pressure: Absent     Pelvic: Cervical exam deferred        Extremities: Normal range of motion.  Edema: None  Mental Status:  Normal mood and affect. Normal behavior. Normal judgment and thought content.   Assessment and Plan:  Pregnancy: G2P1000 at [redacted]w[redacted]d  1. Advanced maternal age in multigravida, second trimester   2. Hypertension affecting pregnancy in second trimester   3. Supervision of high risk pregnancy in second trimester   4. Abnormal genetic test in pregnancy   5. Early Dx GDM (early 2nd trimester) FBS  102-178, PP 141-234, increase insulin and f/u one week, growth [place]  - insulin NPH Human (HUMULIN N,NOVOLIN N) 100 UNIT/ML injection; Take 32 units before breakfast and 18 units before bedtime  Dispense: 10 mL; Refill: 11  Preterm labor symptoms and general obstetric precautions including but not limited to vaginal bleeding, contractions, leaking of fluid and fetal movement were reviewed in detail with the patient. Please refer to After Visit Summary for other counseling recommendations.  Return in about 1 week (around 10/18/2016).   Scheryl Darter, MD

## 2016-10-11 NOTE — Patient Instructions (Signed)
Third Trimester of Pregnancy The third trimester is from week 28 through week 40 (months 7 through 9). The third trimester is a time when the unborn baby (fetus) is growing rapidly. At the end of the ninth month, the fetus is about 20 inches in length and weighs 6-10 pounds. Body changes during your third trimester Your body will continue to go through many changes during pregnancy. The changes vary from woman to woman. During the third trimester:  Your weight will continue to increase. You can expect to gain 25-35 pounds (11-16 kg) by the end of the pregnancy.  You may begin to get stretch marks on your hips, abdomen, and breasts.  You may urinate more often because the fetus is moving lower into your pelvis and pressing on your bladder.  You may develop or continue to have heartburn. This is caused by increased hormones that slow down muscles in the digestive tract.  You may develop or continue to have constipation because increased hormones slow digestion and cause the muscles that push waste through your intestines to relax.  You may develop hemorrhoids. These are swollen veins (varicose veins) in the rectum that can itch or be painful.  You may develop swollen, bulging veins (varicose veins) in your legs.  You may have increased body aches in the pelvis, back, or thighs. This is due to weight gain and increased hormones that are relaxing your joints.  You may have changes in your hair. These can include thickening of your hair, rapid growth, and changes in texture. Some women also have hair loss during or after pregnancy, or hair that feels dry or thin. Your hair will most likely return to normal after your baby is born.  Your breasts will continue to grow and they will continue to become tender. A yellow fluid (colostrum) may leak from your breasts. This is the first milk you are producing for your baby.  Your belly button may stick out.  You may notice more swelling in your hands,  face, or ankles.  You may have increased tingling or numbness in your hands, arms, and legs. The skin on your belly may also feel numb.  You may feel short of breath because of your expanding uterus.  You may have more problems sleeping. This can be caused by the size of your belly, increased need to urinate, and an increase in your body's metabolism.  You may notice the fetus "dropping," or moving lower in your abdomen (lightening).  You may have increased vaginal discharge.  You may notice your joints feel loose and you may have pain around your pelvic bone.  What to expect at prenatal visits You will have prenatal exams every 2 weeks until week 36. Then you will have weekly prenatal exams. During a routine prenatal visit:  You will be weighed to make sure you and the baby are growing normally.  Your blood pressure will be taken.  Your abdomen will be measured to track your baby's growth.  The fetal heartbeat will be listened to.  Any test results from the previous visit will be discussed.  You may have a cervical check near your due date to see if your cervix has softened or thinned (effaced).  You will be tested for Group B streptococcus. This happens between 35 and 37 weeks.  Your health care provider may ask you:  What your birth plan is.  How you are feeling.  If you are feeling the baby move.  If you have had   any abnormal symptoms, such as leaking fluid, bleeding, severe headaches, or abdominal cramping.  If you are using any tobacco products, including cigarettes, chewing tobacco, and electronic cigarettes.  If you have any questions.  Other tests or screenings that may be performed during your third trimester include:  Blood tests that check for low iron levels (anemia).  Fetal testing to check the health, activity level, and growth of the fetus. Testing is done if you have certain medical conditions or if there are problems during the  pregnancy.  Nonstress test (NST). This test checks the health of your baby to make sure there are no signs of problems, such as the baby not getting enough oxygen. During this test, a belt is placed around your belly. The baby is made to move, and its heart rate is monitored during movement.  What is false labor? False labor is a condition in which you feel small, irregular tightenings of the muscles in the womb (contractions) that usually go away with rest, changing position, or drinking water. These are called Braxton Hicks contractions. Contractions may last for hours, days, or even weeks before true labor sets in. If contractions come at regular intervals, become more frequent, increase in intensity, or become painful, you should see your health care provider. What are the signs of labor?  Abdominal cramps.  Regular contractions that start at 10 minutes apart and become stronger and more frequent with time.  Contractions that start on the top of the uterus and spread down to the lower abdomen and back.  Increased pelvic pressure and dull back pain.  A watery or bloody mucus discharge that comes from the vagina.  Leaking of amniotic fluid. This is also known as your "water breaking." It could be a slow trickle or a gush. Let your health care provider know if it has a color or strange odor. If you have any of these signs, call your health care provider right away, even if it is before your due date. Follow these instructions at home: Medicines  Follow your health care provider's instructions regarding medicine use. Specific medicines may be either safe or unsafe to take during pregnancy.  Take a prenatal vitamin that contains at least 600 micrograms (mcg) of folic acid.  If you develop constipation, try taking a stool softener if your health care provider approves. Eating and drinking  Eat a balanced diet that includes fresh fruits and vegetables, whole grains, good sources of protein  such as meat, eggs, or tofu, and low-fat dairy. Your health care provider will help you determine the amount of weight gain that is right for you.  Avoid raw meat and uncooked cheese. These carry germs that can cause birth defects in the baby.  If you have low calcium intake from food, talk to your health care provider about whether you should take a daily calcium supplement.  Eat four or five small meals rather than three large meals a day.  Limit foods that are high in fat and processed sugars, such as fried and sweet foods.  To prevent constipation: ? Drink enough fluid to keep your urine clear or pale yellow. ? Eat foods that are high in fiber, such as fresh fruits and vegetables, whole grains, and beans. Activity  Exercise only as directed by your health care provider. Most women can continue their usual exercise routine during pregnancy. Try to exercise for 30 minutes at least 5 days a week. Stop exercising if you experience uterine contractions.  Avoid heavy   lifting.  Do not exercise in extreme heat or humidity, or at high altitudes.  Wear low-heel, comfortable shoes.  Practice good posture.  You may continue to have sex unless your health care provider tells you otherwise. Relieving pain and discomfort  Take frequent breaks and rest with your legs elevated if you have leg cramps or low back pain.  Take warm sitz baths to soothe any pain or discomfort caused by hemorrhoids. Use hemorrhoid cream if your health care provider approves.  Wear a good support bra to prevent discomfort from breast tenderness.  If you develop varicose veins: ? Wear support pantyhose or compression stockings as told by your healthcare provider. ? Elevate your feet for 15 minutes, 3-4 times a day. Prenatal care  Write down your questions. Take them to your prenatal visits.  Keep all your prenatal visits as told by your health care provider. This is important. Safety  Wear your seat belt at  all times when driving.  Make a list of emergency phone numbers, including numbers for family, friends, the hospital, and police and fire departments. General instructions  Avoid cat litter boxes and soil used by cats. These carry germs that can cause birth defects in the baby. If you have a cat, ask someone to clean the litter box for you.  Do not travel far distances unless it is absolutely necessary and only with the approval of your health care provider.  Do not use hot tubs, steam rooms, or saunas.  Do not drink alcohol.  Do not use any products that contain nicotine or tobacco, such as cigarettes and e-cigarettes. If you need help quitting, ask your health care provider.  Do not use any medicinal herbs or unprescribed drugs. These chemicals affect the formation and growth of the baby.  Do not douche or use tampons or scented sanitary pads.  Do not cross your legs for long periods of time.  To prepare for the arrival of your baby: ? Take prenatal classes to understand, practice, and ask questions about labor and delivery. ? Make a trial run to the hospital. ? Visit the hospital and tour the maternity area. ? Arrange for maternity or paternity leave through employers. ? Arrange for family and friends to take care of pets while you are in the hospital. ? Purchase a rear-facing car seat and make sure you know how to install it in your car. ? Pack your hospital bag. ? Prepare the baby's nursery. Make sure to remove all pillows and stuffed animals from the baby's crib to prevent suffocation.  Visit your dentist if you have not gone during your pregnancy. Use a soft toothbrush to brush your teeth and be gentle when you floss. Contact a health care provider if:  You are unsure if you are in labor or if your water has broken.  You become dizzy.  You have mild pelvic cramps, pelvic pressure, or nagging pain in your abdominal area.  You have lower back pain.  You have persistent  nausea, vomiting, or diarrhea.  You have an unusual or bad smelling vaginal discharge.  You have pain when you urinate. Get help right away if:  Your water breaks before 37 weeks.  You have regular contractions less than 5 minutes apart before 37 weeks.  You have a fever.  You are leaking fluid from your vagina.  You have spotting or bleeding from your vagina.  You have severe abdominal pain or cramping.  You have rapid weight loss or weight gain.    You have shortness of breath with chest pain.  You notice sudden or extreme swelling of your face, hands, ankles, feet, or legs.  Your baby makes fewer than 10 movements in 2 hours.  You have severe headaches that do not go away when you take medicine.  You have vision changes. Summary  The third trimester is from week 28 through week 40, months 7 through 9. The third trimester is a time when the unborn baby (fetus) is growing rapidly.  During the third trimester, your discomfort may increase as you and your baby continue to gain weight. You may have abdominal, leg, and back pain, sleeping problems, and an increased need to urinate.  During the third trimester your breasts will keep growing and they will continue to become tender. A yellow fluid (colostrum) may leak from your breasts. This is the first milk you are producing for your baby.  False labor is a condition in which you feel small, irregular tightenings of the muscles in the womb (contractions) that eventually go away. These are called Braxton Hicks contractions. Contractions may last for hours, days, or even weeks before true labor sets in.  Signs of labor can include: abdominal cramps; regular contractions that start at 10 minutes apart and become stronger and more frequent with time; watery or bloody mucus discharge that comes from the vagina; increased pelvic pressure and dull back pain; and leaking of amniotic fluid. This information is not intended to replace advice  given to you by your health care provider. Make sure you discuss any questions you have with your health care provider. Document Released: 12/19/2000 Document Revised: 06/02/2015 Document Reviewed: 02/26/2012 [employer] Interactive Patient Education  2017 [employer]  

## 2016-10-18 ENCOUNTER — Ambulatory Visit (INDEPENDENT_AMBULATORY_CARE_PROVIDER_SITE_OTHER): Payer: Self-pay | Admitting: Obstetrics & Gynecology

## 2016-10-18 VITALS — BP 134/82 | HR 96 | Wt 212.0 lb

## 2016-10-18 DIAGNOSIS — O4590 Premature separation of placenta, unspecified, unspecified trimester: Secondary | ICD-10-CM

## 2016-10-18 DIAGNOSIS — O24419 Gestational diabetes mellitus in pregnancy, unspecified control: Secondary | ICD-10-CM

## 2016-10-18 DIAGNOSIS — O163 Unspecified maternal hypertension, third trimester: Secondary | ICD-10-CM

## 2016-10-18 DIAGNOSIS — O09522 Supervision of elderly multigravida, second trimester: Secondary | ICD-10-CM

## 2016-10-18 DIAGNOSIS — O0993 Supervision of high risk pregnancy, unspecified, third trimester: Secondary | ICD-10-CM

## 2016-10-18 MED ORDER — AMOXICILLIN-POT CLAVULANATE 875-125 MG PO TABS: 1.0000 | ORAL_TABLET | Freq: Two times a day (BID) | ORAL | 1 refills | Status: DC

## 2016-10-18 MED ORDER — INSULIN NPH (HUMAN) (ISOPHANE) 100 UNIT/ML ~~LOC~~ SUSP: SUBCUTANEOUS | 11 refills | Status: DC

## 2016-10-18 MED ORDER — INSULIN REGULAR HUMAN 100 UNIT/ML IJ SOLN: INTRAMUSCULAR | 11 refills | Status: DC

## 2016-10-18 NOTE — Progress Notes (Signed)
   PRENATAL VISIT NOTE  Subjective:  [name] is a 46 y.o. G2P1000 at [redacted]w[redacted]d being seen today for ongoing prenatal care.  She is currently monitored for the following issues for this high-risk pregnancy and has Advanced maternal age in multigravida, second trimester; Hypertension complicating pregnancy; Placenta abruptio, antepartum; GBS (group B streptococcus) UTI complicating pregnancy; early Dx GDM (early 2nd trimester); Supervision of high-risk pregnancy; and Abnormal genetic test in pregnancy on her problem list.  Patient reports no complaints.  Contractions: Not present. Vag. Bleeding: None.  Movement: Present. Denies leaking of fluid.   The following portions of the patient's history were reviewed and updated as appropriate: allergies, current medications, past family history, past medical history, past social history, past surgical history and problem list. Problem list updated.  Objective:   Vitals:   10/18/16 1301  BP: 134/82  Pulse: 96  Weight: 212 lb (96.2 kg)    Fetal Status: Fetal Heart Rate (bpm): 154   Movement: Present     General:  Alert, oriented and cooperative. Patient is in no acute distress.  Skin: Skin is warm and dry. No rash noted.   Cardiovascular: Normal heart rate noted  Respiratory: Normal respiratory effort, no problems with respiration noted  Abdomen: Soft, gravid, appropriate for gestational age.  Pain/Pressure: Present     Pelvic: Cervical exam deferred        Extremities: Normal range of motion.  Edema: None  Mental Status:  Normal mood and affect. Normal behavior. Normal judgment and thought content.   Assessment and Plan:  Pregnancy: G2P1000 at [redacted]w[redacted]d  1. Supervision of high risk pregnancy in third trimester Folliculitis left axilla - amoxicillin-clavulanate (AUGMENTIN) 875-125 MG tablet; Take 1 tablet by mouth 2 (two) times daily.  Dispense: 20 tablet; Refill: 1  2. early Dx GDM (early 2nd trimester) FBS120-180, ZO109-604 - insulin NPH  Human (HUMULIN N,NOVOLIN N) 100 UNIT/ML injection; Take 34 units before breakfast and 22 units before bedtime  Dispense: 10 mL; Refill: 11 - insulin regular (NOVOLIN R,HUMULIN R) 100 units/mL injection; Take 18 units before breakfast and 20 units before dinner  Dispense: 10 mL; Refill: 11  3. Hypertension affecting pregnancy in third trimester No need for medication   Preterm labor symptoms and general obstetric precautions including but not limited to vaginal bleeding, contractions, leaking of fluid and fetal movement were reviewed in detail with the patient. Please refer to After Visit Summary for other counseling recommendations.  Return in about 1 week (around 10/25/2016).   Scheryl Darter, MD

## 2016-10-18 NOTE — Patient Instructions (Signed)
Third Trimester of Pregnancy The third trimester is from week 28 through week 40 (months 7 through 9). The third trimester is a time when the unborn baby (fetus) is growing rapidly. At the end of the ninth month, the fetus is about 20 inches in length and weighs 6-10 pounds. Body changes during your third trimester Your body will continue to go through many changes during pregnancy. The changes vary from woman to woman. During the third trimester:  Your weight will continue to increase. You can expect to gain 25-35 pounds (11-16 kg) by the end of the pregnancy.  You may begin to get stretch marks on your hips, abdomen, and breasts.  You may urinate more often because the fetus is moving lower into your pelvis and pressing on your bladder.  You may develop or continue to have heartburn. This is caused by increased hormones that slow down muscles in the digestive tract.  You may develop or continue to have constipation because increased hormones slow digestion and cause the muscles that push waste through your intestines to relax.  You may develop hemorrhoids. These are swollen veins (varicose veins) in the rectum that can itch or be painful.  You may develop swollen, bulging veins (varicose veins) in your legs.  You may have increased body aches in the pelvis, back, or thighs. This is due to weight gain and increased hormones that are relaxing your joints.  You may have changes in your hair. These can include thickening of your hair, rapid growth, and changes in texture. Some women also have hair loss during or after pregnancy, or hair that feels dry or thin. Your hair will most likely return to normal after your baby is born.  Your breasts will continue to grow and they will continue to become tender. A yellow fluid (colostrum) may leak from your breasts. This is the first milk you are producing for your baby.  Your belly button may stick out.  You may notice more swelling in your hands,  face, or ankles.  You may have increased tingling or numbness in your hands, arms, and legs. The skin on your belly may also feel numb.  You may feel short of breath because of your expanding uterus.  You may have more problems sleeping. This can be caused by the size of your belly, increased need to urinate, and an increase in your body's metabolism.  You may notice the fetus "dropping," or moving lower in your abdomen (lightening).  You may have increased vaginal discharge.  You may notice your joints feel loose and you may have pain around your pelvic bone.  What to expect at prenatal visits You will have prenatal exams every 2 weeks until week 36. Then you will have weekly prenatal exams. During a routine prenatal visit:  You will be weighed to make sure you and the baby are growing normally.  Your blood pressure will be taken.  Your abdomen will be measured to track your baby's growth.  The fetal heartbeat will be listened to.  Any test results from the previous visit will be discussed.  You may have a cervical check near your due date to see if your cervix has softened or thinned (effaced).  You will be tested for Group B streptococcus. This happens between 35 and 37 weeks.  Your health care provider may ask you:  What your birth plan is.  How you are feeling.  If you are feeling the baby move.  If you have had   any abnormal symptoms, such as leaking fluid, bleeding, severe headaches, or abdominal cramping.  If you are using any tobacco products, including cigarettes, chewing tobacco, and electronic cigarettes.  If you have any questions.  Other tests or screenings that may be performed during your third trimester include:  Blood tests that check for low iron levels (anemia).  Fetal testing to check the health, activity level, and growth of the fetus. Testing is done if you have certain medical conditions or if there are problems during the  pregnancy.  Nonstress test (NST). This test checks the health of your baby to make sure there are no signs of problems, such as the baby not getting enough oxygen. During this test, a belt is placed around your belly. The baby is made to move, and its heart rate is monitored during movement.  What is false labor? False labor is a condition in which you feel small, irregular tightenings of the muscles in the womb (contractions) that usually go away with rest, changing position, or drinking water. These are called Braxton Hicks contractions. Contractions may last for hours, days, or even weeks before true labor sets in. If contractions come at regular intervals, become more frequent, increase in intensity, or become painful, you should see your health care provider. What are the signs of labor?  Abdominal cramps.  Regular contractions that start at 10 minutes apart and become stronger and more frequent with time.  Contractions that start on the top of the uterus and spread down to the lower abdomen and back.  Increased pelvic pressure and dull back pain.  A watery or bloody mucus discharge that comes from the vagina.  Leaking of amniotic fluid. This is also known as your "water breaking." It could be a slow trickle or a gush. Let your health care provider know if it has a color or strange odor. If you have any of these signs, call your health care provider right away, even if it is before your due date. Follow these instructions at home: Medicines  Follow your health care provider's instructions regarding medicine use. Specific medicines may be either safe or unsafe to take during pregnancy.  Take a prenatal vitamin that contains at least 600 micrograms (mcg) of folic acid.  If you develop constipation, try taking a stool softener if your health care provider approves. Eating and drinking  Eat a balanced diet that includes fresh fruits and vegetables, whole grains, good sources of protein  such as meat, eggs, or tofu, and low-fat dairy. Your health care provider will help you determine the amount of weight gain that is right for you.  Avoid raw meat and uncooked cheese. These carry germs that can cause birth defects in the baby.  If you have low calcium intake from food, talk to your health care provider about whether you should take a daily calcium supplement.  Eat four or five small meals rather than three large meals a day.  Limit foods that are high in fat and processed sugars, such as fried and sweet foods.  To prevent constipation: ? Drink enough fluid to keep your urine clear or pale yellow. ? Eat foods that are high in fiber, such as fresh fruits and vegetables, whole grains, and beans. Activity  Exercise only as directed by your health care provider. Most women can continue their usual exercise routine during pregnancy. Try to exercise for 30 minutes at least 5 days a week. Stop exercising if you experience uterine contractions.  Avoid heavy   lifting.  Do not exercise in extreme heat or humidity, or at high altitudes.  Wear low-heel, comfortable shoes.  Practice good posture.  You may continue to have sex unless your health care provider tells you otherwise. Relieving pain and discomfort  Take frequent breaks and rest with your legs elevated if you have leg cramps or low back pain.  Take warm sitz baths to soothe any pain or discomfort caused by hemorrhoids. Use hemorrhoid cream if your health care provider approves.  Wear a good support bra to prevent discomfort from breast tenderness.  If you develop varicose veins: ? Wear support pantyhose or compression stockings as told by your healthcare provider. ? Elevate your feet for 15 minutes, 3-4 times a day. Prenatal care  Write down your questions. Take them to your prenatal visits.  Keep all your prenatal visits as told by your health care provider. This is important. Safety  Wear your seat belt at  all times when driving.  Make a list of emergency phone numbers, including numbers for family, friends, the hospital, and police and fire departments. General instructions  Avoid cat litter boxes and soil used by cats. These carry germs that can cause birth defects in the baby. If you have a cat, ask someone to clean the litter box for you.  Do not travel far distances unless it is absolutely necessary and only with the approval of your health care provider.  Do not use hot tubs, steam rooms, or saunas.  Do not drink alcohol.  Do not use any products that contain nicotine or tobacco, such as cigarettes and e-cigarettes. If you need help quitting, ask your health care provider.  Do not use any medicinal herbs or unprescribed drugs. These chemicals affect the formation and growth of the baby.  Do not douche or use tampons or scented sanitary pads.  Do not cross your legs for long periods of time.  To prepare for the arrival of your baby: ? Take prenatal classes to understand, practice, and ask questions about labor and delivery. ? Make a trial run to the hospital. ? Visit the hospital and tour the maternity area. ? Arrange for maternity or paternity leave through employers. ? Arrange for family and friends to take care of pets while you are in the hospital. ? Purchase a rear-facing car seat and make sure you know how to install it in your car. ? Pack your hospital bag. ? Prepare the baby's nursery. Make sure to remove all pillows and stuffed animals from the baby's crib to prevent suffocation.  Visit your dentist if you have not gone during your pregnancy. Use a soft toothbrush to brush your teeth and be gentle when you floss. Contact a health care provider if:  You are unsure if you are in labor or if your water has broken.  You become dizzy.  You have mild pelvic cramps, pelvic pressure, or nagging pain in your abdominal area.  You have lower back pain.  You have persistent  nausea, vomiting, or diarrhea.  You have an unusual or bad smelling vaginal discharge.  You have pain when you urinate. Get help right away if:  Your water breaks before 37 weeks.  You have regular contractions less than 5 minutes apart before 37 weeks.  You have a fever.  You are leaking fluid from your vagina.  You have spotting or bleeding from your vagina.  You have severe abdominal pain or cramping.  You have rapid weight loss or weight gain.    You have shortness of breath with chest pain.  You notice sudden or extreme swelling of your face, hands, ankles, feet, or legs.  Your baby makes fewer than 10 movements in 2 hours.  You have severe headaches that do not go away when you take medicine.  You have vision changes. Summary  The third trimester is from week 28 through week 40, months 7 through 9. The third trimester is a time when the unborn baby (fetus) is growing rapidly.  During the third trimester, your discomfort may increase as you and your baby continue to gain weight. You may have abdominal, leg, and back pain, sleeping problems, and an increased need to urinate.  During the third trimester your breasts will keep growing and they will continue to become tender. A yellow fluid (colostrum) may leak from your breasts. This is the first milk you are producing for your baby.  False labor is a condition in which you feel small, irregular tightenings of the muscles in the womb (contractions) that eventually go away. These are called Braxton Hicks contractions. Contractions may last for hours, days, or even weeks before true labor sets in.  Signs of labor can include: abdominal cramps; regular contractions that start at 10 minutes apart and become stronger and more frequent with time; watery or bloody mucus discharge that comes from the vagina; increased pelvic pressure and dull back pain; and leaking of amniotic fluid. This information is not intended to replace advice  given to you by your health care provider. Make sure you discuss any questions you have with your health care provider. Document Released: 12/19/2000 Document Revised: 06/02/2015 Document Reviewed: 02/26/2012 [employer] Interactive Patient Education  2017 [employer]  

## 2016-10-19 ENCOUNTER — Ambulatory Visit (HOSPITAL_COMMUNITY)
Admission: RE | Admit: 2016-10-19 | Discharge: 2016-10-19 | Disposition: A | Discharge status: Home / Self Care | Payer: Self-pay | Source: Ambulatory Visit | Attending: Obstetrics & Gynecology | Admitting: Obstetrics & Gynecology

## 2016-10-19 ENCOUNTER — Other Ambulatory Visit (HOSPITAL_COMMUNITY): Payer: Self-pay | Admitting: *Deleted

## 2016-10-19 ENCOUNTER — Other Ambulatory Visit (HOSPITAL_COMMUNITY): Payer: Self-pay | Admitting: Maternal and Fetal Medicine

## 2016-10-19 ENCOUNTER — Encounter (HOSPITAL_COMMUNITY): Payer: Self-pay

## 2016-10-19 DIAGNOSIS — O280 Abnormal hematological finding on antenatal screening of mother: Secondary | ICD-10-CM

## 2016-10-19 DIAGNOSIS — O24414 Gestational diabetes mellitus in pregnancy, insulin controlled: Secondary | ICD-10-CM

## 2016-10-19 DIAGNOSIS — O163 Unspecified maternal hypertension, third trimester: Secondary | ICD-10-CM

## 2016-10-19 DIAGNOSIS — O09523 Supervision of elderly multigravida, third trimester: Secondary | ICD-10-CM

## 2016-10-19 DIAGNOSIS — O99213 Obesity complicating pregnancy, third trimester: Secondary | ICD-10-CM

## 2016-10-19 DIAGNOSIS — Z3A30 30 weeks gestation of pregnancy: Secondary | ICD-10-CM

## 2016-10-26 ENCOUNTER — Ambulatory Visit (INDEPENDENT_AMBULATORY_CARE_PROVIDER_SITE_OTHER): Payer: Self-pay | Admitting: Obstetrics and Gynecology

## 2016-10-26 VITALS — BP 135/77 | HR 87 | Wt 212.0 lb

## 2016-10-26 DIAGNOSIS — O0993 Supervision of high risk pregnancy, unspecified, third trimester: Secondary | ICD-10-CM

## 2016-10-26 DIAGNOSIS — O4590 Premature separation of placenta, unspecified, unspecified trimester: Secondary | ICD-10-CM

## 2016-10-26 DIAGNOSIS — O09522 Supervision of elderly multigravida, second trimester: Secondary | ICD-10-CM

## 2016-10-26 DIAGNOSIS — O4593 Premature separation of placenta, unspecified, third trimester: Secondary | ICD-10-CM

## 2016-10-26 DIAGNOSIS — B951 Streptococcus, group B, as the cause of diseases classified elsewhere: Secondary | ICD-10-CM

## 2016-10-26 DIAGNOSIS — O285 Abnormal chromosomal and genetic finding on antenatal screening of mother: Secondary | ICD-10-CM

## 2016-10-26 DIAGNOSIS — O24419 Gestational diabetes mellitus in pregnancy, unspecified control: Secondary | ICD-10-CM

## 2016-10-26 DIAGNOSIS — O09523 Supervision of elderly multigravida, third trimester: Secondary | ICD-10-CM

## 2016-10-26 DIAGNOSIS — O2343 Unspecified infection of urinary tract in pregnancy, third trimester: Secondary | ICD-10-CM

## 2016-10-26 MED ORDER — INSULIN NPH (HUMAN) (ISOPHANE) 100 UNIT/ML ~~LOC~~ SUSP: SUBCUTANEOUS | 11 refills | Status: DC

## 2016-10-26 MED ORDER — INSULIN REGULAR HUMAN 100 UNIT/ML IJ SOLN: INTRAMUSCULAR | 11 refills | Status: DC

## 2016-10-26 NOTE — Addendum Note (Signed)
Addended by: Kathee DeltonHILLMAN, CARRIE L on: 10/26/2016 10:46 AM   Modules accepted: Orders

## 2016-10-26 NOTE — Patient Instructions (Signed)
Third Trimester of Pregnancy The third trimester is from week 28 through week 40 (months 7 through 9). The third trimester is a time when the unborn baby (fetus) is growing rapidly. At the end of the ninth month, the fetus is about 20 inches in length and weighs 6-10 pounds. Body changes during your third trimester Your body will continue to go through many changes during pregnancy. The changes vary from woman to woman. During the third trimester:  Your weight will continue to increase. You can expect to gain 25-35 pounds (11-16 kg) by the end of the pregnancy.  You may begin to get stretch marks on your hips, abdomen, and breasts.  You may urinate more often because the fetus is moving lower into your pelvis and pressing on your bladder.  You may develop or continue to have heartburn. This is caused by increased hormones that slow down muscles in the digestive tract.  You may develop or continue to have constipation because increased hormones slow digestion and cause the muscles that push waste through your intestines to relax.  You may develop hemorrhoids. These are swollen veins (varicose veins) in the rectum that can itch or be painful.  You may develop swollen, bulging veins (varicose veins) in your legs.  You may have increased body aches in the pelvis, back, or thighs. This is due to weight gain and increased hormones that are relaxing your joints.  You may have changes in your hair. These can include thickening of your hair, rapid growth, and changes in texture. Some women also have hair loss during or after pregnancy, or hair that feels dry or thin. Your hair will most likely return to normal after your baby is born.  Your breasts will continue to grow and they will continue to become tender. A yellow fluid (colostrum) may leak from your breasts. This is the first milk you are producing for your baby.  Your belly button may stick out.  You may notice more swelling in your hands,  face, or ankles.  You may have increased tingling or numbness in your hands, arms, and legs. The skin on your belly may also feel numb.  You may feel short of breath because of your expanding uterus.  You may have more problems sleeping. This can be caused by the size of your belly, increased need to urinate, and an increase in your body's metabolism.  You may notice the fetus "dropping," or moving lower in your abdomen (lightening).  You may have increased vaginal discharge.  You may notice your joints feel loose and you may have pain around your pelvic bone.  What to expect at prenatal visits You will have prenatal exams every 2 weeks until week 36. Then you will have weekly prenatal exams. During a routine prenatal visit:  You will be weighed to make sure you and the baby are growing normally.  Your blood pressure will be taken.  Your abdomen will be measured to track your baby's growth.  The fetal heartbeat will be listened to.  Any test results from the previous visit will be discussed.  You may have a cervical check near your due date to see if your cervix has softened or thinned (effaced).  You will be tested for Group B streptococcus. This happens between 35 and 37 weeks.  Your health care provider may ask you:  What your birth plan is.  How you are feeling.  If you are feeling the baby move.  If you have had   any abnormal symptoms, such as leaking fluid, bleeding, severe headaches, or abdominal cramping.  If you are using any tobacco products, including cigarettes, chewing tobacco, and electronic cigarettes.  If you have any questions.  Other tests or screenings that may be performed during your third trimester include:  Blood tests that check for low iron levels (anemia).  Fetal testing to check the health, activity level, and growth of the fetus. Testing is done if you have certain medical conditions or if there are problems during the  pregnancy.  Nonstress test (NST). This test checks the health of your baby to make sure there are no signs of problems, such as the baby not getting enough oxygen. During this test, a belt is placed around your belly. The baby is made to move, and its heart rate is monitored during movement.  What is false labor? False labor is a condition in which you feel small, irregular tightenings of the muscles in the womb (contractions) that usually go away with rest, changing position, or drinking water. These are called Braxton Hicks contractions. Contractions may last for hours, days, or even weeks before true labor sets in. If contractions come at regular intervals, become more frequent, increase in intensity, or become painful, you should see your health care provider. What are the signs of labor?  Abdominal cramps.  Regular contractions that start at 10 minutes apart and become stronger and more frequent with time.  Contractions that start on the top of the uterus and spread down to the lower abdomen and back.  Increased pelvic pressure and dull back pain.  A watery or bloody mucus discharge that comes from the vagina.  Leaking of amniotic fluid. This is also known as your "water breaking." It could be a slow trickle or a gush. Let your health care provider know if it has a color or strange odor. If you have any of these signs, call your health care provider right away, even if it is before your due date. Follow these instructions at home: Medicines  Follow your health care provider's instructions regarding medicine use. Specific medicines may be either safe or unsafe to take during pregnancy.  Take a prenatal vitamin that contains at least 600 micrograms (mcg) of folic acid.  If you develop constipation, try taking a stool softener if your health care provider approves. Eating and drinking  Eat a balanced diet that includes fresh fruits and vegetables, whole grains, good sources of protein  such as meat, eggs, or tofu, and low-fat dairy. Your health care provider will help you determine the amount of weight gain that is right for you.  Avoid raw meat and uncooked cheese. These carry germs that can cause birth defects in the baby.  If you have low calcium intake from food, talk to your health care provider about whether you should take a daily calcium supplement.  Eat four or five small meals rather than three large meals a day.  Limit foods that are high in fat and processed sugars, such as fried and sweet foods.  To prevent constipation: ? Drink enough fluid to keep your urine clear or pale yellow. ? Eat foods that are high in fiber, such as fresh fruits and vegetables, whole grains, and beans. Activity  Exercise only as directed by your health care provider. Most women can continue their usual exercise routine during pregnancy. Try to exercise for 30 minutes at least 5 days a week. Stop exercising if you experience uterine contractions.  Avoid heavy   lifting.  Do not exercise in extreme heat or humidity, or at high altitudes.  Wear low-heel, comfortable shoes.  Practice good posture.  You may continue to have sex unless your health care provider tells you otherwise. Relieving pain and discomfort  Take frequent breaks and rest with your legs elevated if you have leg cramps or low back pain.  Take warm sitz baths to soothe any pain or discomfort caused by hemorrhoids. Use hemorrhoid cream if your health care provider approves.  Wear a good support bra to prevent discomfort from breast tenderness.  If you develop varicose veins: ? Wear support pantyhose or compression stockings as told by your healthcare provider. ? Elevate your feet for 15 minutes, 3-4 times a day. Prenatal care  Write down your questions. Take them to your prenatal visits.  Keep all your prenatal visits as told by your health care provider. This is important. Safety  Wear your seat belt at  all times when driving.  Make a list of emergency phone numbers, including numbers for family, friends, the hospital, and police and fire departments. General instructions  Avoid cat litter boxes and soil used by cats. These carry germs that can cause birth defects in the baby. If you have a cat, ask someone to clean the litter box for you.  Do not travel far distances unless it is absolutely necessary and only with the approval of your health care provider.  Do not use hot tubs, steam rooms, or saunas.  Do not drink alcohol.  Do not use any products that contain nicotine or tobacco, such as cigarettes and e-cigarettes. If you need help quitting, ask your health care provider.  Do not use any medicinal herbs or unprescribed drugs. These chemicals affect the formation and growth of the baby.  Do not douche or use tampons or scented sanitary pads.  Do not cross your legs for long periods of time.  To prepare for the arrival of your baby: ? Take prenatal classes to understand, practice, and ask questions about labor and delivery. ? Make a trial run to the hospital. ? Visit the hospital and tour the maternity area. ? Arrange for maternity or paternity leave through employers. ? Arrange for family and friends to take care of pets while you are in the hospital. ? Purchase a rear-facing car seat and make sure you know how to install it in your car. ? Pack your hospital bag. ? Prepare the baby's nursery. Make sure to remove all pillows and stuffed animals from the baby's crib to prevent suffocation.  Visit your dentist if you have not gone during your pregnancy. Use a soft toothbrush to brush your teeth and be gentle when you floss. Contact a health care provider if:  You are unsure if you are in labor or if your water has broken.  You become dizzy.  You have mild pelvic cramps, pelvic pressure, or nagging pain in your abdominal area.  You have lower back pain.  You have persistent  nausea, vomiting, or diarrhea.  You have an unusual or bad smelling vaginal discharge.  You have pain when you urinate. Get help right away if:  Your water breaks before 37 weeks.  You have regular contractions less than 5 minutes apart before 37 weeks.  You have a fever.  You are leaking fluid from your vagina.  You have spotting or bleeding from your vagina.  You have severe abdominal pain or cramping.  You have rapid weight loss or weight gain.    You have shortness of breath with chest pain.  You notice sudden or extreme swelling of your face, hands, ankles, feet, or legs.  Your baby makes fewer than 10 movements in 2 hours.  You have severe headaches that do not go away when you take medicine.  You have vision changes. Summary  The third trimester is from week 28 through week 40, months 7 through 9. The third trimester is a time when the unborn baby (fetus) is growing rapidly.  During the third trimester, your discomfort may increase as you and your baby continue to gain weight. You may have abdominal, leg, and back pain, sleeping problems, and an increased need to urinate.  During the third trimester your breasts will keep growing and they will continue to become tender. A yellow fluid (colostrum) may leak from your breasts. This is the first milk you are producing for your baby.  False labor is a condition in which you feel small, irregular tightenings of the muscles in the womb (contractions) that eventually go away. These are called Braxton Hicks contractions. Contractions may last for hours, days, or even weeks before true labor sets in.  Signs of labor can include: abdominal cramps; regular contractions that start at 10 minutes apart and become stronger and more frequent with time; watery or bloody mucus discharge that comes from the vagina; increased pelvic pressure and dull back pain; and leaking of amniotic fluid. This information is not intended to replace advice  given to you by your health care provider. Make sure you discuss any questions you have with your health care provider. Document Released: 12/19/2000 Document Revised: 06/02/2015 Document Reviewed: 02/26/2012 [employer] Interactive Patient Education  2017 [employer]  

## 2016-10-26 NOTE — Progress Notes (Signed)
PRENATAL VISIT NOTE  Subjective:  [name] is a 46 y.o. G2P1000 at [redacted]w[redacted]d being seen today for ongoing prenatal care.  She is currently monitored for the following issues for this high-risk pregnancy and has Advanced maternal age in multigravida, second trimester; Hypertension complicating pregnancy; Placenta abruptio, antepartum; GBS (group B streptococcus) UTI complicating pregnancy; early Dx GDM (early 2nd trimester); Supervision of high-risk pregnancy; and Abnormal genetic test in pregnancy on her problem list.  Patient reports no complaints.  Contractions: Not present.  .  Movement: Present. Denies leaking of fluid.   NPH 34 units in am, 22 units QHS  Regular 18 units in am, 20 units BG fasting > 100, PP > 130s  The following portions of the patient's history were reviewed and updated as appropriate: allergies, current medications, past family history, past medical history, past social history, past surgical history and problem list. Problem list updated.  Objective:   Vitals:   10/26/16 0927  BP: 135/77  Pulse: 87  Weight: 212 lb (96.2 kg)    Fetal Status: Fetal Heart Rate (bpm): 156   Movement: Present     General:  Alert, oriented and cooperative. Patient is in no acute distress.  Skin: Skin is warm and dry. No rash noted.   Cardiovascular: Normal heart rate noted  Respiratory: Normal respiratory effort, no problems with respiration noted  Abdomen: Soft, gravid, appropriate for gestational age.  Pain/Pressure: Present     Pelvic: Cervical exam deferred        Extremities: Normal range of motion.  Edema: None  Mental Status:  Normal mood and affect. Normal behavior. Normal judgment and thought content.   [place] [facility] Ob Follow Up  Result Date: 10/19/2016 ----------------------------------------------------------------------  OBSTETRICS REPORT                      (Signed Final 10/19/2016 10:24 am) ----------------------------------------------------------------------  Patient Info  ID #:       [id number]                          D.O.B.:  [birth year] (46 yrs)  Name:       [name]                    Visit Date: 10/19/2016 09:14 am ---------------------------------------------------------------------- Performed By  Performed By:     Lenise Arena        Ref. Address:     [address]                                                             [place], [place]  [address]  Attending:        Particia Nearing MD       Secondary Phy.:   MAU Nursing-                                                             MAU/Triage  Referred By:      Vikki Ports              Location:         [facility] ---------------------------------------------------------------------- Orders   #  Description                                 Code   1  [place] [facility] OB FOLLOW UP                         60454.09  ----------------------------------------------------------------------   #  Ordered By               Order #        Accession #    Episode #   1  Particia Nearing            [id number]      [id number]     [id number]  ---------------------------------------------------------------------- Indications   [redacted] weeks gestation of pregnancy                Z3A.30   Advanced Paternal Age - (81)                   Z31.5   Obesity complicating pregnancy, second         O99.212   trimester   Hypertension - Chronic/Pre-existing; no meds   O10.019   Encounter for other antenatal screening        Z36.2   follow-up   Advanced maternal age multigravida 35+         O36.522   (25), second trimester (abn Quad)   Abnormal biochemical screen (quad) for         O28.9   Trisomy 21 (1:56); declined further testing   Gestational diabetes in pregnancy, insulin     O24.414   controlled  ---------------------------------------------------------------------- OB History   Blood Type:            Height:  5'5"   Weight (lb):  203       BMI:  33.78  Gravidity:    2         Term:   1        Prem:   0        SAB:   0  TOP:          0       Ectopic:  0        Living: 1 ---------------------------------------------------------------------- Fetal Evaluation  Num Of Fetuses:     1  Fetal Heart         149  Rate(bpm):  Cardiac Activity:   Observed  Presentation:       Breech  Placenta:  Anterior, above cervical os  P. Cord Insertion:  Previously Visualized  Amniotic Fluid  AFI FV:      Subjectively within normal limits  AFI Sum(cm)     %Tile       Largest Pocket(cm)  17.09           63          5.92  RUQ(cm)       RLQ(cm)       LUQ(cm)        LLQ(cm)  2.99          3.72          5.92           4.46 ---------------------------------------------------------------------- Biometry  BPD:      78.5  mm     G. Age:  31w 4d         82  %    CI:         72.3   %    70 - 86                                                          FL/HC:      18.9   %    19.2 - 21.4  HC:      293.7  mm     G. Age:  32w 3d         81  %    HC/AC:      0.99        0.99 - 1.21  AC:      295.6  mm     G. Age:  33w 4d       > 97  %    FL/BPD:     70.8   %    71 - 87  FL:       55.6  mm     G. Age:  29w 2d         18  %    FL/AC:      18.8   %    20 - 24  HUM:      48.2  mm     G. Age:  28w 2d         19  %  Est. FW:    1886  gm      4 lb 3 oz     82  % ---------------------------------------------------------------------- Gestational Age  LMP:           27w 3d        Date:  04/10/16                 EDD:   01/15/17  U/S Today:     31w 5d                                        EDD:   12/16/16  Best:          30w 0d     Det. By:  U/S  (08/24/16)          EDD:   12/28/16 ---------------------------------------------------------------------- Anatomy  Cranium:  Appears normal         Aortic Arch:            Previously seen  Cavum:                 Appears normal         Ductal Arch:            Previously seen   Ventricles:            Appears normal         Diaphragm:              Previously seen  Choroid Plexus:        Previously seen        Stomach:                Appears normal, left                                                                        sided  Cerebellum:            Previously seen        Abdomen:                Appears normal  Posterior Fossa:       Previously seen        Abdominal Wall:         Previously seen  Nuchal Fold:           Previously seen        Cord Vessels:           Previously seen  Face:                  Orbits and profile     Kidneys:                Appear normal                         previously seen  Lips:                  Previously seen        Bladder:                Appears normal  Thoracic:              Appears normal         Spine:                  Previously seen  Heart:                 Previously seen        Upper Extremities:      Previously seen  RVOT:                  Previously seen        Lower Extremities:      Previously seen  LVOT:                  Previously seen  Other:  Female gender. Heels visualized previously. Technically difficult due to  maternal habitus and fetal position. ---------------------------------------------------------------------- Cervix Uterus Adnexa  Cervix  Not visualized (advanced GA >29wks) ---------------------------------------------------------------------- Impression  SIUP at 30+0 weeks  Normal interval anatomy; anatomic survey complete  Normal amniotic fluid volume  Appropriate interval growth with EFW at the 82nd %tile; AC >  97th %tile ---------------------------------------------------------------------- Recommendations  Follow-up ultrasound for growth in 4 weeks ----------------------------------------------------------------------                 Particia Nearing, MD Electronically Signed Final Report   10/19/2016 10:24 am ----------------------------------------------------------------------    Assessment and Plan:    Pregnancy: G2P1000 at [redacted]w[redacted]d   1. Supervision of high risk pregnancy in third trimester Weekly NST/BPP starting 32 weeks, schedule today  2. Abnormal genetic test in pregnancy  3. Advanced maternal age in multigravida, second trimester Next growth 11/9  4. early Dx GDM (early 2nd trimester) BG remains elevated Increase insulin as follows NPH 38 units in am, 26 units QHS Regular 20 units in am, 24 units To meet with diabetic educator next week  5. Group B Streptococcus urinary tract infection affecting pregnancy in third trimester ppx in labor  6. HTN complicating pregnancy - stable BP  Preterm labor symptoms and general obstetric precautions including but not limited to vaginal bleeding, contractions, leaking of fluid and fetal movement were reviewed in detail with the patient. Please refer to After Visit Summary for other counseling recommendations.  Return in about 1 week (around 11/02/2016) for OB visit (MD).   Conan Bowens, MD

## 2016-11-01 ENCOUNTER — Ambulatory Visit (INDEPENDENT_AMBULATORY_CARE_PROVIDER_SITE_OTHER): Payer: Self-pay | Admitting: Student

## 2016-11-01 ENCOUNTER — Encounter (HOSPITAL_COMMUNITY): Payer: Self-pay

## 2016-11-01 ENCOUNTER — Ambulatory Visit (HOSPITAL_COMMUNITY)
Admission: RE | Admit: 2016-11-01 | Discharge: 2016-11-01 | Disposition: A | Discharge status: Home / Self Care | Payer: Self-pay | Source: Ambulatory Visit | Attending: Obstetrics and Gynecology | Admitting: Obstetrics and Gynecology

## 2016-11-01 ENCOUNTER — Other Ambulatory Visit: Payer: Self-pay | Admitting: Obstetrics and Gynecology

## 2016-11-01 ENCOUNTER — Ambulatory Visit: Payer: Self-pay | Admitting: *Deleted

## 2016-11-01 ENCOUNTER — Other Ambulatory Visit: Payer: Self-pay | Admitting: General Practice

## 2016-11-01 ENCOUNTER — Encounter: Payer: Self-pay | Attending: Obstetrics and Gynecology | Admitting: *Deleted

## 2016-11-01 VITALS — BP 120/89 | HR 90 | Wt 215.4 lb

## 2016-11-01 VITALS — BP 140/84 | HR 96 | Wt 216.0 lb

## 2016-11-01 DIAGNOSIS — O4593 Premature separation of placenta, unspecified, third trimester: Secondary | ICD-10-CM

## 2016-11-01 DIAGNOSIS — R7302 Impaired glucose tolerance (oral): Secondary | ICD-10-CM | POA: Insufficient documentation

## 2016-11-01 DIAGNOSIS — O24414 Gestational diabetes mellitus in pregnancy, insulin controlled: Secondary | ICD-10-CM

## 2016-11-01 DIAGNOSIS — O0993 Supervision of high risk pregnancy, unspecified, third trimester: Secondary | ICD-10-CM

## 2016-11-01 DIAGNOSIS — O09523 Supervision of elderly multigravida, third trimester: Secondary | ICD-10-CM

## 2016-11-01 DIAGNOSIS — O321XX0 Maternal care for breech presentation, not applicable or unspecified: Secondary | ICD-10-CM | POA: Insufficient documentation

## 2016-11-01 DIAGNOSIS — O10013 Pre-existing essential hypertension complicating pregnancy, third trimester: Secondary | ICD-10-CM | POA: Insufficient documentation

## 2016-11-01 DIAGNOSIS — O163 Unspecified maternal hypertension, third trimester: Secondary | ICD-10-CM

## 2016-11-01 DIAGNOSIS — O24419 Gestational diabetes mellitus in pregnancy, unspecified control: Secondary | ICD-10-CM

## 2016-11-01 DIAGNOSIS — O4590 Premature separation of placenta, unspecified, unspecified trimester: Secondary | ICD-10-CM

## 2016-11-01 DIAGNOSIS — Z3A31 31 weeks gestation of pregnancy: Secondary | ICD-10-CM

## 2016-11-01 DIAGNOSIS — Z713 Dietary counseling and surveillance: Secondary | ICD-10-CM | POA: Insufficient documentation

## 2016-11-01 DIAGNOSIS — O09522 Supervision of elderly multigravida, second trimester: Secondary | ICD-10-CM

## 2016-11-01 LAB — GLUCOSE, CAPILLARY: GLUCOSE-CAPILLARY: 145 mg/dL — AB (ref 65–99)

## 2016-11-01 MED ORDER — INSULIN NPH (HUMAN) (ISOPHANE) 100 UNIT/ML ~~LOC~~ SUSP: SUBCUTANEOUS | 11 refills | Status: DC

## 2016-11-01 MED ORDER — INSULIN REGULAR HUMAN 100 UNIT/ML IJ SOLN: INTRAMUSCULAR | 11 refills | Status: DC

## 2016-11-01 NOTE — Progress Notes (Signed)
  Patient was seen on 11/01/2016 for Gestational Diabetes self-management follow up visit. EDD 12/28/2016, at 31 weeks and 6 days. Patient here with [employer] interpretor. Patient states no education, unable to read or write. She has been able to record her BG on Log Sheet and states she is comfortable with giving her insulin shots. She expressed understanding that her doses were increased today. She states she is doing well with her food but would like a review to make sure. Today I reviewed Carb Counting by [employer] and she expressed good understanding that the Starch, Fruit and Milk food groups have similar amounts of carbohydrate so can be interchanged (or grouped into a single category of Carb Choices) She also verbalized understanding that vegetables, meat and fat have much less impact on BG readings and are not limited except for moderation.    Describes the effects of carbohydrates on blood glucose levels  Demonstrates ability to create a balanced meal plan  Demonstrates carbohydrate counting by [employer]  Plan:  Continue to check BG before breakfast and 2 hours after first bite of breakfast, lunch and dinner as directed by MD  Continue to bring Log Sheet to every medical appointment   Aim for 2-3 Carb Choices (starch, fruit or milk) at each meal and 1 at each snack Include protein with each meal and snack Take medication as directed by MD   Patient will be seen for follow-up in one month

## 2016-11-01 NOTE — Progress Notes (Signed)
   PRENATAL VISIT NOTE  Subjective:  [name] is a 46 y.o. G2P1000 at 283w6d being seen today for ongoing prenatal care.  She is currently monitored for the following issues for this high-risk pregnancy and has Advanced maternal age in multigravida, second trimester; Hypertension complicating pregnancy; Placenta abruptio, antepartum; GBS (group B streptococcus) UTI complicating pregnancy; early Dx GDM (early 2nd trimester); Supervision of high-risk pregnancy; and Abnormal genetic test in pregnancy on her problem list.  Patient reports no complaints.  Contractions: Not present. Vag. Bleeding: None.  Movement: Present. Denies leaking of fluid.    Patient's BG still elevated;  Fasting between 99-122 and PP between 149 and 200.  Discussed case with Dr. Debroah LoopArnold, will increase patient's insulin as follows:  NPH 40 units before breaksfast and 30 unitss before bedtime Regular insulin 24 units before breakfast and 24 units before bedtime   The following portions of the patient's history were reviewed and updated as appropriate: allergies, current medications, past family history, past medical history, past social history, past surgical history and problem list. Problem list updated.  Objective:   Vitals:   11/01/16 0850  BP: 120/89  Pulse: 90  Weight: 215 lb 6.4 oz (97.7 kg)    Fetal Status: Fetal Heart Rate (bpm): 156 Fundal Height: 33 cm Movement: Present     General:  Alert, oriented and cooperative. Patient is in no acute distress.  Skin: Skin is warm and dry. No rash noted.   Cardiovascular: Normal heart rate noted  Respiratory: Normal respiratory effort, no problems with respiration noted  Abdomen: Soft, gravid, appropriate for gestational age.  Pain/Pressure: Absent     Pelvic: Cervical exam deferred        Extremities: Normal range of motion.  Edema: None  Mental Status:  Normal mood and affect. Normal behavior. Normal judgment and thought content.   Assessment and Plan:    Pregnancy: G2P1000 at 183w6d  1. early Dx GDM (early 2nd trimester) Patient's insulin increased as follows:  NPH 40 units before breaksfast and 30 untis before bedtime Regular insulin 24 units before breakfast and 24 units before bedtime Patient to meet with Diabetic educator today, and also has US this afternoon at 2:30 pm.   Patient to meet with Diabetic educator this morning and also US this afternoon.  2. Hypertension affecting pregnancy in third trimester Blood pressure well controlled today  3. Supervision of high risk pregnancy in third trimester Will begin antenatal testing next week.   4. Placenta abruptio, antepartum Denies VB.   Preterm labor symptoms and general obstetric precautions including but not limited to vaginal bleeding, contractions, leaking of fluid and fetal movement were reviewed in detail with the patient. Please refer to After Visit Summary for other counseling recommendations.  Return in about 1 week (around 11/08/2016).   Marylene LandKathryn Lorraine Ailah Barna, CNM

## 2016-11-01 NOTE — Patient Instructions (Signed)

## 2016-11-01 NOTE — Progress Notes (Signed)
Omar(uncle)  interpreter for patient

## 2016-11-01 NOTE — Procedures (Signed)
Desiree PeachRakia [name] 09/06/1970 2920w6d  Fetus A Non-Stress Test Interpretation for 11/01/16  Indication: Unsatisfactory BPP

## 2016-11-08 ENCOUNTER — Ambulatory Visit (INDEPENDENT_AMBULATORY_CARE_PROVIDER_SITE_OTHER): Payer: Self-pay | Admitting: Student

## 2016-11-08 ENCOUNTER — Ambulatory Visit (INDEPENDENT_AMBULATORY_CARE_PROVIDER_SITE_OTHER): Payer: Self-pay | Admitting: *Deleted

## 2016-11-08 ENCOUNTER — Ambulatory Visit: Payer: Self-pay

## 2016-11-08 VITALS — BP 129/80 | HR 87 | Wt 215.3 lb

## 2016-11-08 DIAGNOSIS — O163 Unspecified maternal hypertension, third trimester: Secondary | ICD-10-CM

## 2016-11-08 DIAGNOSIS — O24414 Gestational diabetes mellitus in pregnancy, insulin controlled: Secondary | ICD-10-CM

## 2016-11-08 DIAGNOSIS — O24419 Gestational diabetes mellitus in pregnancy, unspecified control: Secondary | ICD-10-CM

## 2016-11-08 DIAGNOSIS — O0993 Supervision of high risk pregnancy, unspecified, third trimester: Secondary | ICD-10-CM

## 2016-11-08 DIAGNOSIS — O09523 Supervision of elderly multigravida, third trimester: Secondary | ICD-10-CM

## 2016-11-08 LAB — POCT URINALYSIS DIP (DEVICE)
Bilirubin Urine: NEGATIVE
Glucose, UA: 500 mg/dL — AB
HGB URINE DIPSTICK: NEGATIVE
KETONES UR: NEGATIVE mg/dL
Leukocytes, UA: NEGATIVE
Nitrite: NEGATIVE
PH: 6.5 (ref 5.0–8.0)
PROTEIN: NEGATIVE mg/dL
Urobilinogen, UA: 0.2 mg/dL (ref 0.0–1.0)

## 2016-11-08 MED ORDER — INSULIN NPH (HUMAN) (ISOPHANE) 100 UNIT/ML ~~LOC~~ SUSP: SUBCUTANEOUS | 11 refills | Status: DC

## 2016-11-08 MED ORDER — INSULIN REGULAR HUMAN 100 UNIT/ML IJ SOLN: INTRAMUSCULAR | 11 refills | Status: DC

## 2016-11-08 NOTE — Progress Notes (Signed)

## 2016-11-08 NOTE — Progress Notes (Signed)
   PRENATAL VISIT NOTE  Subjective:  [name] is a 46 y.o. G2P1000 at 5471w6d being seen today for ongoing prenatal care.  She is currently monitored for the following issues for this high-risk pregnancy and has Advanced maternal age in multigravida, second trimester; Hypertension complicating pregnancy; Placenta abruptio, antepartum; GBS (group B streptococcus) UTI complicating pregnancy; early Dx GDM (early 2nd trimester); Supervision of high-risk pregnancy; and Abnormal genetic test in pregnancy on her problem list.  Patient reports confusion over her bedtime dose of NPH. She reports that she is still eating carbs occasionally. .  Contractions: Not present. Vag. Bleeding: None.  Movement: Present. Denies leaking of fluid.  Patient's husband has been doing her insulin for her; she does not draw it up.   The following portions of the patient's history were reviewed and updated as appropriate: allergies, current medications, past family history, past medical history, past social history, past surgical history and problem list. Problem list updated.  Objective:   Vitals:   11/08/16 0907  BP: 129/80  Pulse: 87  Weight: 215 lb 4.8 oz (97.7 kg)    Fetal Status: Fetal Heart Rate (bpm): NST   Movement: Present     General:  Alert, oriented and cooperative. Patient is in no acute distress.  Skin: Skin is warm and dry. No rash noted.   Cardiovascular: Normal heart rate noted  Respiratory: Normal respiratory effort, no problems with respiration noted  Abdomen: Soft, gravid, appropriate for gestational age.  Pain/Pressure: Absent     Pelvic: Cervical exam deferred        Extremities: Normal range of motion.  Edema: None  Mental Status:  Normal mood and affect. Normal behavior. Normal judgment and thought content.   Assessment and Plan:  Pregnancy: G2P1000 at 2671w6d  1. Supervision of high risk pregnancy in third trimester Continue antenatal testing; patient very agreeable to keeping appts.     Patient should have diabetic educator appt in two weeks.    2. Insulin controlled gestational diabetes mellitus (GDM) in third trimester Patient's log reviewed with Dr. Jolayne Pantheronstant. Log shows elevated fastings and PP, although improved from before. Occasional PP over 200. Per Dr. Jolayne Pantheronstant, increase insulin as follows:   - insulin NPH Human (HUMULIN N,NOVOLIN N) 100 UNIT/ML injection; Take 42 units before breakfast and 30 units at bedtime.  Dispense: 10 mL; Refill: 11 - insulin regular (NOVOLIN R,HUMULIN R) 100 units/mL injection; Take 26 units before breakfast and 26 units before dinner  Dispense: 10 mL; Refill: 11  Encouraged patient to draw up with her insulin (patient says she knows how). Emphasized that it is important for patient to be able to manage her own insulin.   3. Hypertension affecting pregnancy in third trimester BP normal today.   4. Advanced maternal age in multigravida, third trimester  5. early Dx GDM (early 2nd trimester) Continue antenatal testing.   Preterm labor symptoms and general obstetric precautions including but not limited to vaginal bleeding, contractions, leaking of fluid and fetal movement were reviewed in detail with the patient. Please refer to After Visit Summary for other counseling recommendations.  Return in about 8 days (around 11/16/2016) for NST @ 1000, HOB @ 1100 - has US @ 0900, Cancel appts on 11/8.   Desiree LandKathryn Lorraine [name], CNM

## 2016-11-08 NOTE — Progress Notes (Signed)
Live interpreter [facility](Emmi)  present with patient. Pt is not taking insulin as instructed last week. She has been confused and did not completely understand the instructions given to her last week. She did not increase the NPH @ HS to 40 units because she thought it was too much. Instead she continued the previous dose.

## 2016-11-14 ENCOUNTER — Encounter (HOSPITAL_COMMUNITY): Payer: Self-pay

## 2016-11-15 ENCOUNTER — Encounter: Payer: Self-pay | Admitting: Obstetrics and Gynecology

## 2016-11-15 ENCOUNTER — Other Ambulatory Visit: Payer: Self-pay

## 2016-11-16 ENCOUNTER — Encounter (HOSPITAL_COMMUNITY): Payer: Self-pay

## 2016-11-16 ENCOUNTER — Ambulatory Visit (INDEPENDENT_AMBULATORY_CARE_PROVIDER_SITE_OTHER): Payer: Self-pay | Admitting: *Deleted

## 2016-11-16 ENCOUNTER — Other Ambulatory Visit: Payer: Self-pay | Admitting: *Deleted

## 2016-11-16 ENCOUNTER — Ambulatory Visit (INDEPENDENT_AMBULATORY_CARE_PROVIDER_SITE_OTHER): Payer: Self-pay | Admitting: Obstetrics and Gynecology

## 2016-11-16 ENCOUNTER — Telehealth: Payer: Self-pay | Admitting: Student

## 2016-11-16 ENCOUNTER — Encounter: Payer: Self-pay | Admitting: Obstetrics and Gynecology

## 2016-11-16 ENCOUNTER — Other Ambulatory Visit (HOSPITAL_COMMUNITY): Payer: Self-pay | Admitting: *Deleted

## 2016-11-16 ENCOUNTER — Ambulatory Visit (HOSPITAL_COMMUNITY)
Admission: RE | Admit: 2016-11-16 | Discharge: 2016-11-16 | Disposition: A | Discharge status: Home / Self Care | Payer: Self-pay | Source: Ambulatory Visit | Attending: Obstetrics & Gynecology | Admitting: Obstetrics & Gynecology

## 2016-11-16 VITALS — BP 132/78 | HR 80 | Wt 215.5 lb

## 2016-11-16 DIAGNOSIS — O24414 Gestational diabetes mellitus in pregnancy, insulin controlled: Secondary | ICD-10-CM

## 2016-11-16 DIAGNOSIS — O09293 Supervision of pregnancy with other poor reproductive or obstetric history, third trimester: Secondary | ICD-10-CM

## 2016-11-16 DIAGNOSIS — O4593 Premature separation of placenta, unspecified, third trimester: Secondary | ICD-10-CM

## 2016-11-16 DIAGNOSIS — O285 Abnormal chromosomal and genetic finding on antenatal screening of mother: Secondary | ICD-10-CM

## 2016-11-16 DIAGNOSIS — O4590 Premature separation of placenta, unspecified, unspecified trimester: Secondary | ICD-10-CM

## 2016-11-16 DIAGNOSIS — B951 Streptococcus, group B, as the cause of diseases classified elsewhere: Secondary | ICD-10-CM

## 2016-11-16 DIAGNOSIS — O09523 Supervision of elderly multigravida, third trimester: Secondary | ICD-10-CM | POA: Insufficient documentation

## 2016-11-16 DIAGNOSIS — O281 Abnormal biochemical finding on antenatal screening of mother: Secondary | ICD-10-CM | POA: Insufficient documentation

## 2016-11-16 DIAGNOSIS — O09299 Supervision of pregnancy with other poor reproductive or obstetric history, unspecified trimester: Secondary | ICD-10-CM

## 2016-11-16 DIAGNOSIS — O163 Unspecified maternal hypertension, third trimester: Secondary | ICD-10-CM

## 2016-11-16 DIAGNOSIS — O0993 Supervision of high risk pregnancy, unspecified, third trimester: Secondary | ICD-10-CM

## 2016-11-16 DIAGNOSIS — O2343 Unspecified infection of urinary tract in pregnancy, third trimester: Secondary | ICD-10-CM

## 2016-11-16 DIAGNOSIS — O10913 Unspecified pre-existing hypertension complicating pregnancy, third trimester: Secondary | ICD-10-CM | POA: Insufficient documentation

## 2016-11-16 DIAGNOSIS — O24419 Gestational diabetes mellitus in pregnancy, unspecified control: Secondary | ICD-10-CM

## 2016-11-16 DIAGNOSIS — Z3A34 34 weeks gestation of pregnancy: Secondary | ICD-10-CM | POA: Insufficient documentation

## 2016-11-16 DIAGNOSIS — O321XX0 Maternal care for breech presentation, not applicable or unspecified: Secondary | ICD-10-CM | POA: Insufficient documentation

## 2016-11-16 DIAGNOSIS — O09522 Supervision of elderly multigravida, second trimester: Secondary | ICD-10-CM

## 2016-11-16 LAB — POCT URINALYSIS DIP (DEVICE)
Bilirubin Urine: NEGATIVE
Glucose, UA: NEGATIVE mg/dL
HGB URINE DIPSTICK: NEGATIVE
Ketones, ur: NEGATIVE mg/dL
Leukocytes, UA: NEGATIVE
NITRITE: NEGATIVE
PH: 7 (ref 5.0–8.0)
PROTEIN: NEGATIVE mg/dL
Specific Gravity, Urine: 1.015 (ref 1.005–1.030)
UROBILINOGEN UA: 0.2 mg/dL (ref 0.0–1.0)

## 2016-11-16 MED ORDER — INSULIN REGULAR HUMAN 100 UNIT/ML IJ SOLN: INTRAMUSCULAR | 11 refills | Status: DC

## 2016-11-16 MED ORDER — INSULIN NPH (HUMAN) (ISOPHANE) 100 UNIT/ML ~~LOC~~ SUSP: SUBCUTANEOUS | 11 refills | Status: DC

## 2016-11-16 NOTE — Progress Notes (Signed)
Subjective:  [name] is a 46 y.o. G2P1000 at 2262w0d being seen today for ongoing prenatal care.  She is currently monitored for the following issues for this high-risk pregnancy and has Advanced maternal age in multigravida, second trimester; Hypertension complicating pregnancy; Placenta abruptio, antepartum; GBS (group B streptococcus) UTI complicating pregnancy; early Dx GDM (early 2nd trimester); Supervision of high-risk pregnancy; Abnormal genetic test in pregnancy; and Prior pregnancy with fetal demise and current pregnancy on their problem list.  Patient reports no complaints.  Contractions: Not present. Vag. Bleeding: None.  Movement: Present. Denies leaking of fluid.   The following portions of the patient's history were reviewed and updated as appropriate: allergies, current medications, past family history, past medical history, past social history, past surgical history and problem list. Problem list updated.  Objective:   Vitals:   11/16/16 1107  BP: 132/78  Pulse: 80  Weight: 97.8 kg (215 lb 8 oz)    Fetal Status: Fetal Heart Rate (bpm): NST   Movement: Present     General:  Alert, oriented and cooperative. Patient is in no acute distress.  Skin: Skin is warm and dry. No rash noted.   Cardiovascular: Normal heart rate noted  Respiratory: Normal respiratory effort, no problems with respiration noted  Abdomen: Soft, gravid, appropriate for gestational age. Pain/Pressure: Present     Pelvic:  Cervical exam deferred        Extremities: Normal range of motion.  Edema: None  Mental Status: Normal mood and affect. Normal behavior. Normal judgment and thought content.   Urinalysis: Urine Protein: Negative Urine Glucose: Negative  Assessment and Plan:  Pregnancy: G2P1000 at 1862w0d  1. Supervision of high risk pregnancy in third trimester Stable  2. Insulin controlled gestational diabetes mellitus (GDM) in third trimester BS still uncontrolled. Adjust to insulin regiment  made as noted in med list Has f/u appt with Diabetic educator Will order appts for opth and fetal ECHO Appropriate interval growth, f/u growth scans scheduled BPP 8/8 today and RNST Continue with weekly antenatal testing  3. Hypertension affecting pregnancy in third trimester Stable Without meds. Not taking BASA  4. Advanced maternal age in multigravida, third trimester Abnormal quad, declined further testing  5. Group B Streptococcus urinary tract infection affecting pregnancy in third trimester Tx while in labor  6. Abnormal genetic test in pregnancy See above  8. Placenta abruptio, antepartum No further bleeding     10. early Dx GDM (early 2nd trimester) See above as well - insulin regular (NOVOLIN R,HUMULIN R) 100 units/mL injection; Take 29 units before breakfast and 29 units before dinner  Dispense: 10 mL; Refill: 11 - insulin NPH Human (HUMULIN N,NOVOLIN N) 100 UNIT/ML injection; Take 45 units before breakfast and 30 units at bedtime.  Dispense: 10 mL; Refill: 11  Preterm labor symptoms and general obstetric precautions including but not limited to vaginal bleeding, contractions, leaking of fluid and fetal movement were reviewed in detail with the patient. Please refer to After Visit Summary for other counseling recommendations.  Return in about 1 week (around 11/23/2016) [facility] @ 0915; [facility] has Education appt @ 1100, OB visit.   Hermina StaggersErvin, Shantelle Alles L, MD

## 2016-11-16 NOTE — Progress Notes (Signed)
[facility].  Called Dr. Hinton Lovelyottons office for referral for 2 d echo - unable to schedule appointment before EDD, they will consult with Dr. Elizebeth Brookingotton about overbooking and call us 11/19/16.

## 2016-11-16 NOTE — Progress Notes (Signed)
Interpreter "Emmi" present for encounter. US for growth and BPP done today.

## 2016-11-19 ENCOUNTER — Telehealth: Payer: Self-pay | Admitting: *Deleted

## 2016-11-19 NOTE — Telephone Encounter (Signed)
Debbie from Dr. Casilda Carlsotton's office left message on nurse voicemail on 11/16/16 at 1229.  States they can perform fetal echo on 12/06/16 at 9 am in the [place] office.  Requests we call the office back at [phone] to confirm the appointment.  Also, requests we send records to the office.

## 2016-11-19 NOTE — Telephone Encounter (Signed)
Notified by Dr. Casilda Carlsotton's office they can do her fetal echo 12/06/16 0900.  Desiree Ablesalled [name] with [employer] [phone]. Unable to leave message , call disconnected.

## 2016-11-22 ENCOUNTER — Ambulatory Visit (HOSPITAL_COMMUNITY)
Admission: RE | Admit: 2016-11-22 | Discharge: 2016-11-22 | Disposition: A | Discharge status: Home / Self Care | Payer: Self-pay | Source: Ambulatory Visit | Attending: Obstetrics & Gynecology | Admitting: Obstetrics & Gynecology

## 2016-11-22 ENCOUNTER — Inpatient Hospital Stay (HOSPITAL_COMMUNITY)
Admission: AD | Admit: 2016-11-22 | Discharge: 2016-11-22 | Disposition: A | Discharge status: Home / Self Care | Payer: Self-pay | Source: Ambulatory Visit | Attending: Obstetrics and Gynecology | Admitting: Obstetrics and Gynecology

## 2016-11-22 ENCOUNTER — Encounter (HOSPITAL_COMMUNITY): Payer: Self-pay

## 2016-11-22 ENCOUNTER — Encounter (HOSPITAL_COMMUNITY): Payer: Self-pay | Admitting: *Deleted

## 2016-11-22 DIAGNOSIS — O10013 Pre-existing essential hypertension complicating pregnancy, third trimester: Secondary | ICD-10-CM | POA: Insufficient documentation

## 2016-11-22 DIAGNOSIS — G44209 Tension-type headache, unspecified, not intractable: Secondary | ICD-10-CM

## 2016-11-22 DIAGNOSIS — O24419 Gestational diabetes mellitus in pregnancy, unspecified control: Secondary | ICD-10-CM

## 2016-11-22 DIAGNOSIS — R51 Headache: Secondary | ICD-10-CM | POA: Insufficient documentation

## 2016-11-22 DIAGNOSIS — Z3A34 34 weeks gestation of pregnancy: Secondary | ICD-10-CM | POA: Insufficient documentation

## 2016-11-22 DIAGNOSIS — O09523 Supervision of elderly multigravida, third trimester: Secondary | ICD-10-CM | POA: Insufficient documentation

## 2016-11-22 DIAGNOSIS — Z794 Long term (current) use of insulin: Secondary | ICD-10-CM | POA: Insufficient documentation

## 2016-11-22 DIAGNOSIS — O09522 Supervision of elderly multigravida, second trimester: Secondary | ICD-10-CM

## 2016-11-22 DIAGNOSIS — O321XX0 Maternal care for breech presentation, not applicable or unspecified: Secondary | ICD-10-CM | POA: Insufficient documentation

## 2016-11-22 DIAGNOSIS — O4590 Premature separation of placenta, unspecified, unspecified trimester: Secondary | ICD-10-CM

## 2016-11-22 DIAGNOSIS — O10913 Unspecified pre-existing hypertension complicating pregnancy, third trimester: Secondary | ICD-10-CM

## 2016-11-22 DIAGNOSIS — O4593 Premature separation of placenta, unspecified, third trimester: Secondary | ICD-10-CM | POA: Insufficient documentation

## 2016-11-22 DIAGNOSIS — O10919 Unspecified pre-existing hypertension complicating pregnancy, unspecified trimester: Secondary | ICD-10-CM

## 2016-11-22 DIAGNOSIS — O24414 Gestational diabetes mellitus in pregnancy, insulin controlled: Secondary | ICD-10-CM | POA: Insufficient documentation

## 2016-11-22 DIAGNOSIS — Z79899 Other long term (current) drug therapy: Secondary | ICD-10-CM | POA: Insufficient documentation

## 2016-11-22 DIAGNOSIS — O26893 Other specified pregnancy related conditions, third trimester: Secondary | ICD-10-CM | POA: Insufficient documentation

## 2016-11-22 DIAGNOSIS — O281 Abnormal biochemical finding on antenatal screening of mother: Secondary | ICD-10-CM | POA: Insufficient documentation

## 2016-11-22 LAB — COMPREHENSIVE METABOLIC PANEL
ALBUMIN: 2.9 g/dL — AB (ref 3.5–5.0)
ALT: 17 U/L (ref 14–54)
ANION GAP: 8 (ref 5–15)
AST: 19 U/L (ref 15–41)
Alkaline Phosphatase: 83 U/L (ref 38–126)
BILIRUBIN TOTAL: 0.1 mg/dL — AB (ref 0.3–1.2)
BUN: 8 mg/dL (ref 6–20)
CHLORIDE: 107 mmol/L (ref 101–111)
CO2: 18 mmol/L — AB (ref 22–32)
Calcium: 9.1 mg/dL (ref 8.9–10.3)
Creatinine, Ser: 0.45 mg/dL (ref 0.44–1.00)
GFR calc Af Amer: >60 mL/min (ref >60)
GFR calc non Af Amer: >60 mL/min (ref >60)
GLUCOSE: 93 mg/dL (ref 65–99)
POTASSIUM: 4 mmol/L (ref 3.5–5.1)
SODIUM: 133 mmol/L — AB (ref 135–145)
Total Protein: 7.1 g/dL (ref 6.5–8.1)

## 2016-11-22 LAB — CBC
HEMATOCRIT: 38 % (ref 36.0–46.0)
Hemoglobin: 12.6 g/dL (ref 12.0–15.0)
MCH: 26.7 pg (ref 26.0–34.0)
MCHC: 33.2 g/dL (ref 30.0–36.0)
MCV: 80.5 fL (ref 78.0–100.0)
PLATELETS: 327 10*3/uL (ref 150–400)
RBC: 4.72 MIL/uL (ref 3.87–5.11)
RDW: 13.4 % (ref 11.5–15.5)
WBC: 7.5 10*3/uL (ref 4.0–10.5)

## 2016-11-22 LAB — URINALYSIS, ROUTINE W REFLEX MICROSCOPIC
Bilirubin Urine: NEGATIVE
GLUCOSE, UA: NEGATIVE mg/dL
Hgb urine dipstick: NEGATIVE
Ketones, ur: NEGATIVE mg/dL
LEUKOCYTES UA: NEGATIVE
Nitrite: NEGATIVE
PROTEIN: NEGATIVE mg/dL
Specific Gravity, Urine: 1.002 — ABNORMAL LOW (ref 1.005–1.030)
pH: 7 (ref 5.0–8.0)

## 2016-11-22 LAB — PROTEIN / CREATININE RATIO, URINE: Creatinine, Urine: 15 mg/dL

## 2016-11-22 MED ORDER — ACETAMINOPHEN 325 MG PO TABS
650.0000 mg | ORAL_TABLET | Freq: Four times a day (QID) | ORAL | Status: DC | PRN
  Administered 2016-11-22: 650 mg via ORAL
  Filled 2016-11-22: qty 2

## 2016-11-22 NOTE — MAU Provider Note (Signed)
Chief Complaint:  Hypertension and Headache   First Provider Initiated Contact with Patient 11/22/16 1207     HPI: [name] is a 46 y.o. G2P1000 at 7534w6dwho presents to maternity admissions reporting headache since yesterday.  Did not take anything for it.  Had an elevated BP in MFM and was sent here.  Has known chronic Htn.. She reports good fetal movement, denies LOF, vaginal bleeding, vaginal itching/burning, urinary symptoms, h/a, dizziness, n/v, diarrhea, constipation or fever/chills.  She denies visual changes or RUQ abdominal pain.  Hypertension  This is a recurrent problem. The current episode started more than 1 month ago. The problem has been waxing and waning since onset. Associated symptoms include headaches. Pertinent negatives include no blurred vision, chest pain, malaise/fatigue, neck pain, orthopnea, palpitations or peripheral edema. There are no associated agents to hypertension. There are no known risk factors for coronary artery disease. Past treatments include nothing.  Headache   This is a new problem. The current episode started yesterday. The problem has been unchanged. The pain does not radiate. The pain quality is similar to prior headaches. The quality of the pain is described as aching. The pain is mild. Pertinent negatives include no abdominal pain, back pain, blurred vision, fever, neck pain, photophobia, sinus pressure, tingling or visual change. Nothing aggravates the symptoms. She has tried nothing for the symptoms. Her past medical history is significant for hypertension.    RN Note: Pt sent up from the clinic for elevated b/p's and pt complaint of headache today.     Past Medical History: Past Medical History:  Diagnosis Date  . Gestational diabetes   . Hypertension     Past obstetric history: OB History  Gravida Para Term Preterm AB Living  2 1 1      0  SAB TAB Ectopic Multiple Live Births          1    # Outcome Date GA Lbr Len/2nd Weight Sex  Delivery Anes PTL Lv  2 Current           1 Term 08/11/97 686w0d  7 lb (3.175 kg) F Vag-Spont   DEC     Complications: Malaria      Past Surgical History: Past Surgical History:  Procedure Laterality Date  . DILATION AND CURETTAGE OF UTERUS    . HYSTEROSCOPY WITH RESECTOSCOPE  2011    Family History: Family History  Problem Relation Age of Onset  . Alcohol abuse Neg Hx     Social History: Social History   Tobacco Use  . Smoking status: Never Smoker  . Smokeless tobacco: Never Used  Substance Use Topics  . Alcohol use: No  . Drug use: No    Allergies: No Known Allergies  Meds:  Medications Prior to Admission  Medication Sig Dispense Refill Last Dose  . docusate sodium (COLACE) 100 MG capsule Take 1 capsule (100 mg total) by mouth 2 (two) times daily as needed for mild constipation. 60 capsule 3 Taking  . insulin NPH Human (HUMULIN N,NOVOLIN N) 100 UNIT/ML injection Take 45 units before breakfast and 30 units at bedtime. 10 mL 11 Taking  . insulin regular (NOVOLIN R,HUMULIN R) 100 units/mL injection Take 29 units before breakfast and 29 units before dinner 10 mL 11 Taking  . Insulin Syringe-Needle U-100 (INSULIN SYRINGE 1CC/31GX5/16") 31G X 5/16" 1 ML MISC 1 Units by Does not apply route 2 (two) times daily. 100 each 11 Taking  . iron polysaccharides (NIFEREX) 150 MG capsule Take 1 capsule (  150 mg total) by mouth daily. 90 capsule 1 Taking  . Prenatal Vit-Fe Fumarate-FA (MULTIVITAMIN-PRENATAL) 27-0.8 MG TABS tablet Take 1 tablet by mouth daily at 12 noon.   Taking    I have reviewed patient's Past Medical Hx, Surgical Hx, Family Hx, Social Hx, medications and allergies.   ROS:  Review of Systems  Constitutional: Negative for fever and malaise/fatigue.  HENT: Negative for sinus pressure.   Eyes: Negative for blurred vision and photophobia.  Cardiovascular: Negative for chest pain, palpitations and orthopnea.  Gastrointestinal: Negative for abdominal pain.   Musculoskeletal: Negative for back pain and neck pain.  Neurological: Positive for headaches. Negative for tingling.   Other systems negative  Physical Exam   Patient Vitals for the past 24 hrs:  BP Pulse  11/22/16 1200 132/87 91  11/22/16 1159 (!) 139/91 89   135/83      123/79        132/86          129/77  Vitals:   11/22/16 1310 11/22/16 1325 11/22/16 1330 11/22/16 1345  BP:  135/90 (!) 140/91 126/81  Pulse:  98 (!) 104 96  SpO2: 100%       Constitutional: Well-developed, well-nourished female in no acute distress.  Cardiovascular: normal rate and rhythm Respiratory: normal effort, clear to auscultation bilaterally GI: Abd soft, non-tender, gravid appropriate for gestational age.   No rebound or guarding. MS: Extremities nontender, no edema, normal ROM Neurologic: Alert and oriented x 4. DTRs normal with no clonus GU: Neg CVAT.  PELVIC EXAM: deferred  FHT:  Baseline 135 , moderate variability, accelerations present, no decelerations Contractions:  Rare   Labs: Results for orders placed or performed during the hospital encounter of 11/22/16 (from the past 24 hour(s))  Urinalysis, Routine w reflex microscopic     Status: Abnormal   Collection Time: 11/22/16 11:46 AM  Result Value Ref Range   Color, Urine COLORLESS (A) YELLOW   APPearance CLEAR CLEAR   Specific Gravity, Urine 1.002 (L) 1.005 - 1.030   pH 7.0 5.0 - 8.0   Glucose, UA NEGATIVE NEGATIVE mg/dL   Hgb urine dipstick NEGATIVE NEGATIVE   Bilirubin Urine NEGATIVE NEGATIVE   Ketones, ur NEGATIVE NEGATIVE mg/dL   Protein, ur NEGATIVE NEGATIVE mg/dL   Nitrite NEGATIVE NEGATIVE   Leukocytes, UA NEGATIVE NEGATIVE  Protein / creatinine ratio, urine     Status: None   Collection Time: 11/22/16 11:46 AM  Result Value Ref Range   Creatinine, Urine 15.00 mg/dL   Total Protein, Urine <6.0 mg/dL   Protein Creatinine Ratio        0.00 - 0.15 mg/mg[Cre]  CBC     Status: None   Collection Time: 11/22/16 12:32 PM   Result Value Ref Range   WBC 7.5 4.0 - 10.5 K/uL   RBC 4.72 3.87 - 5.11 MIL/uL   Hemoglobin 12.6 12.0 - 15.0 g/dL   HCT 16.1 09.6 - 04.5 %   MCV 80.5 78.0 - 100.0 fL   MCH 26.7 26.0 - 34.0 pg   MCHC 33.2 30.0 - 36.0 g/dL   RDW 40.9 81.1 - 91.4 %   Platelets 327 150 - 400 K/uL  Comprehensive metabolic panel     Status: Abnormal   Collection Time: 11/22/16 12:32 PM  Result Value Ref Range   Sodium 133 (L) 135 - 145 mmol/L   Potassium 4.0 3.5 - 5.1 mmol/L   Chloride 107 101 - 111 mmol/L   CO2 18 (L) 22 -  32 mmol/L   Glucose, Bld 93 65 - 99 mg/dL   BUN 8 6 - 20 mg/dL   Creatinine, Ser 2.950.45 0.44 - 1.00 mg/dL   Calcium 9.1 8.9 - 62.110.3 mg/dL   Total Protein 7.1 6.5 - 8.1 g/dL   Albumin 2.9 (L) 3.5 - 5.0 g/dL   AST 19 15 - 41 U/L   ALT 17 14 - 54 U/L   Alkaline Phosphatase 83 38 - 126 U/L   Total Bilirubin 0.1 (L) 0.3 - 1.2 mg/dL   GFR calc non Af Amer >60 >60 mL/min   GFR calc Af Amer >60 >60 mL/min   Anion gap 8 5 - 15    Imaging:    MAU Course/MDM: I have ordered labs and reviewed results.  NST reviewed reactive, cat I Consult Dr Erin FullingHarraway-Smith with presentation, exam findings and test results.  Treatments in MAU included Tylenol for headache, observation, EFM.    Assessment: 1. Placenta abruptio, antepartum   2. Advanced maternal age in multigravida, second trimester   3.     Headache, likely tension 4.      Chronic hypertension, no evidence of preeclampsia  Plan: Discharge home Preeclampsia precautions Preterm  Labor precautions and fetal kick counts Follow up in Office for prenatal visits and recheck of BP  Encouraged to return here or to other Urgent Care/ED if she develops worsening of symptoms, increase in pain, fever, or other concerning symptoms.   Pt stable at time of discharge.  Wynelle BourgeoisMarie Jachai Okazaki CNM, MSN Certified Nurse-Midwife 11/22/2016 12:08 PM

## 2016-11-22 NOTE — Discharge Instructions (Signed)

## 2016-11-22 NOTE — ED Notes (Signed)
Pt here today in [facility].  NST reactive per Dr. Marjo Bickerenney.  Pt BP elevated at start of visit and at rest during NST.  Pt reports she currently has a HA and hasn't taken anything for it.  Denies visual changes, epigastric pain or edema.  Report called to MAU. Pt/FOB ambulated to MAU for further evaluation and signed in.

## 2016-11-22 NOTE — Procedures (Signed)
[name] 06/29/1970 3651w6d  Fetus A Non-Stress Test Interpretation for 11/22/16  Indication: Unsatisfactory BPP  Fetal Heart Rate A Mode: External Baseline Rate (A): 135 bpm Variability: Moderate Accelerations: 10 x 10, 15 x 15 Decelerations: None Multiple birth?: No  Uterine Activity Mode: Palpation, Toco Contraction Frequency (min): none  Interpretation (Fetal Testing) Nonstress Test Interpretation: Reactive Overall Impression: Reassuring for gestational age Comments: FHR tracing reviewed with Dr. Lars Massonenney/Decker

## 2016-11-22 NOTE — MAU Note (Signed)
Pt sent up from the clinic for elevated b/p's and pt complaint of headache today.

## 2016-11-23 ENCOUNTER — Other Ambulatory Visit: Payer: Self-pay

## 2016-11-23 ENCOUNTER — Ambulatory Visit (INDEPENDENT_AMBULATORY_CARE_PROVIDER_SITE_OTHER): Payer: Self-pay | Admitting: Family Medicine

## 2016-11-23 ENCOUNTER — Other Ambulatory Visit (HOSPITAL_COMMUNITY): Payer: Self-pay

## 2016-11-23 ENCOUNTER — Encounter: Payer: Self-pay | Admitting: Family Medicine

## 2016-11-23 VITALS — BP 134/81 | HR 93 | Wt 220.0 lb

## 2016-11-23 DIAGNOSIS — O09293 Supervision of pregnancy with other poor reproductive or obstetric history, third trimester: Secondary | ICD-10-CM

## 2016-11-23 DIAGNOSIS — O0993 Supervision of high risk pregnancy, unspecified, third trimester: Secondary | ICD-10-CM

## 2016-11-23 DIAGNOSIS — O24419 Gestational diabetes mellitus in pregnancy, unspecified control: Secondary | ICD-10-CM

## 2016-11-23 DIAGNOSIS — O163 Unspecified maternal hypertension, third trimester: Secondary | ICD-10-CM

## 2016-11-23 DIAGNOSIS — O09522 Supervision of elderly multigravida, second trimester: Secondary | ICD-10-CM

## 2016-11-23 MED ORDER — INSULIN REGULAR HUMAN 100 UNIT/ML IJ SOLN: INTRAMUSCULAR | 11 refills | Status: DC

## 2016-11-23 MED ORDER — INSULIN NPH (HUMAN) (ISOPHANE) 100 UNIT/ML ~~LOC~~ SUSP: SUBCUTANEOUS | 11 refills | Status: DC

## 2016-11-23 NOTE — Progress Notes (Signed)
   PRENATAL VISIT NOTE  Subjective:  [name] is a 46 y.o. G2P1000 at 5651w0d being seen today for ongoing prenatal care.  She is currently monitored for the following issues for this high-risk pregnancy and has Advanced maternal age in multigravida, second trimester; Hypertension complicating pregnancy; Placenta abruptio, antepartum; GBS (group B streptococcus) UTI complicating pregnancy; early Dx GDM (early 2nd trimester); Supervision of high-risk pregnancy; Abnormal genetic test in pregnancy; and Prior pregnancy with fetal demise and current pregnancy on their problem list.  Patient reports no complaints.  Contractions: Not present. Vag. Bleeding: None.  Movement: Present. Denies leaking of fluid.   The following portions of the patient's history were reviewed and updated as appropriate: allergies, current medications, past family history, past medical history, past social history, past surgical history and problem list. Problem list updated.  Objective:   Vitals:   11/23/16 0917  BP: 134/81  Pulse: 93  Weight: 220 lb (99.8 kg)    Fetal Status: Fetal Heart Rate (bpm): 151   Movement: Present     General:  Alert, oriented and cooperative. Patient is in no acute distress.  Skin: Skin is warm and dry. No rash noted.   Cardiovascular: Normal heart rate noted  Respiratory: Normal respiratory effort, no problems with respiration noted  Abdomen: Soft, gravid, appropriate for gestational age.  Pain/Pressure: Absent     Pelvic: Cervical exam deferred        Extremities: Normal range of motion.  Edema: None  Mental Status:  Normal mood and affect. Normal behavior. Normal judgment and thought content.  IN MAU yesterday with NST, BPP 8/10. Slightly elevated BPs with nml labs FBS 59-110 (8 of 16 out of range) 2 hour pp 67-191 (36 of 49 are out of range) [employer] in-person interpreter used Assessment and Plan:  Pregnancy: G2P1000 at 351w0d  1. Hypertension affecting pregnancy in third  trimester BP ok today   2. Prior pregnancy with fetal demise and current pregnancy in third trimester Continue antenatal testing  IOL at 39 wks  3. Supervision of high risk pregnancy in third trimester Continue routine prenatal care.  4. Advanced maternal age in multigravida, second trimester Declined testing  5. early Dx GDM (early 2nd trimester) Will increase insulin - insulin NPH Human (HUMULIN N,NOVOLIN N) 100 UNIT/ML injection; Take 48 units before breakfast and 30 units at bedtime.  Dispense: 10 mL; Refill: 11 - insulin regular (NOVOLIN R,HUMULIN R) 100 units/mL injection; Take 32 units before breakfast and 32 units before dinner  Dispense: 10 mL; Refill: 11  Preterm labor symptoms and general obstetric precautions including but not limited to vaginal bleeding, contractions, leaking of fluid and fetal movement were reviewed in detail with the patient. Please refer to After Visit Summary for other counseling recommendations.  Return in 1 week (on 11/30/2016) for OB visit and BPP.   Reva Boresanya S Levonia Wolfley, MD

## 2016-11-23 NOTE — Patient Instructions (Signed)
Breastfeeding Deciding to breastfeed is one of the best choices you can make for you and your baby. A change in hormones during pregnancy causes your breast tissue to grow and increases the number and size of your milk ducts. These hormones also allow proteins, sugars, and fats from your blood supply to make breast milk in your milk-producing glands. Hormones prevent breast milk from being released before your baby is born as well as prompt milk flow after birth. Once breastfeeding has begun, thoughts of your baby, as well as his or her sucking or crying, can stimulate the release of milk from your milk-producing glands. Benefits of breastfeeding For Your Baby  Your first milk (colostrum) helps your baby's digestive system function better.  There are antibodies in your milk that help your baby fight off infections.  Your baby has a lower incidence of asthma, allergies, and sudden infant death syndrome.  The nutrients in breast milk are better for your baby than infant formulas and are designed uniquely for your baby's needs.  Breast milk improves your baby's brain development.  Your baby is less likely to develop other conditions, such as childhood obesity, asthma, or type 2 diabetes mellitus.  For You  Breastfeeding helps to create a very special bond between you and your baby.  Breastfeeding is convenient. Breast milk is always available at the correct temperature and costs nothing.  Breastfeeding helps to burn calories and helps you lose the weight gained during pregnancy.  Breastfeeding makes your uterus contract to its prepregnancy size faster and slows bleeding (lochia) after you give birth.  Breastfeeding helps to lower your risk of developing type 2 diabetes mellitus, osteoporosis, and breast or ovarian cancer later in life.  Signs that your baby is hungry Early Signs of Hunger  Increased alertness or activity.  Stretching.  Movement of the head from side to  side.  Movement of the head and opening of the mouth when the corner of the mouth or cheek is stroked (rooting).  Increased sucking sounds, smacking lips, cooing, sighing, or squeaking.  Hand-to-mouth movements.  Increased sucking of fingers or hands.  Late Signs of Hunger  Fussing.  Intermittent crying.  Extreme Signs of Hunger Signs of extreme hunger will require calming and consoling before your baby will be able to breastfeed successfully. Do not wait for the following signs of extreme hunger to occur before you initiate breastfeeding:  Restlessness.  A loud, strong cry.  Screaming.  Breastfeeding basics Breastfeeding Initiation  Find a comfortable place to sit or lie down, with your neck and back well supported.  Place a pillow or rolled up blanket under your baby to bring him or her to the level of your breast (if you are seated). Nursing pillows are specially designed to help support your arms and your baby while you breastfeed.  Make sure that your baby's abdomen is facing your abdomen.  Gently massage your breast. With your fingertips, massage from your chest wall toward your nipple in a circular motion. This encourages milk flow. You may need to continue this action during the feeding if your milk flows slowly.  Support your breast with 4 fingers underneath and your thumb above your nipple. Make sure your fingers are well away from your nipple and your baby's mouth.  Stroke your baby's lips gently with your finger or nipple.  When your baby's mouth is open wide enough, quickly bring your baby to your breast, placing your entire nipple and as much of the colored area   around your nipple (areola) as possible into your baby's mouth. ? More areola should be visible above your baby's upper lip than below the lower lip. ? Your baby's tongue should be between his or her lower gum and your breast.  Ensure that your baby's mouth is correctly positioned around your nipple  (latched). Your baby's lips should create a seal on your breast and be turned out (everted).  It is common for your baby to suck about 2-3 minutes in order to start the flow of breast milk.  Latching Teaching your baby how to latch on to your breast properly is very important. An improper latch can cause nipple pain and decreased milk supply for you and poor weight gain in your baby. Also, if your baby is not latched onto your nipple properly, he or she may swallow some air during feeding. This can make your baby fussy. Burping your baby when you switch breasts during the feeding can help to get rid of the air. However, teaching your baby to latch on properly is still the best way to prevent fussiness from swallowing air while breastfeeding. Signs that your baby has successfully latched on to your nipple:  Silent tugging or silent sucking, without causing you pain.  Swallowing heard between every 3-4 sucks.  Muscle movement above and in front of his or her ears while sucking.  Signs that your baby has not successfully latched on to nipple:  Sucking sounds or smacking sounds from your baby while breastfeeding.  Nipple pain.  If you think your baby has not latched on correctly, slip your finger into the corner of your baby's mouth to break the suction and place it between your baby's gums. Attempt breastfeeding initiation again. Signs of Successful Breastfeeding Signs from your baby:  A gradual decrease in the number of sucks or complete cessation of sucking.  Falling asleep.  Relaxation of his or her body.  Retention of a small amount of milk in his or her mouth.  Letting go of your breast by himself or herself.  Signs from you:  Breasts that have increased in firmness, weight, and size 1-3 hours after feeding.  Breasts that are softer immediately after breastfeeding.  Increased milk volume, as well as a change in milk consistency and color by the fifth day of  breastfeeding.  Nipples that are not sore, cracked, or bleeding.  Signs That Your Baby is Getting Enough Milk  Wetting at least 1-2 diapers during the first 24 hours after birth.  Wetting at least 5-6 diapers every 24 hours for the first week after birth. The urine should be clear or pale yellow by 5 days after birth.  Wetting 6-8 diapers every 24 hours as your baby continues to grow and develop.  At least 3 stools in a 24-hour period by age 5 days. The stool should be soft and yellow.  At least 3 stools in a 24-hour period by age 7 days. The stool should be seedy and yellow.  No loss of weight greater than 10% of birth weight during the first 3 days of age.  Average weight gain of 4-7 ounces (113-198 g) per week after age 4 days.  Consistent daily weight gain by age 5 days, without weight loss after the age of 2 weeks.  After a feeding, your baby may spit up a small amount. This is common. Breastfeeding frequency and duration Frequent feeding will help you make more milk and can prevent sore nipples and breast engorgement. Breastfeed when   you feel the need to reduce the fullness of your breasts or when your baby shows signs of hunger. This is called "breastfeeding on demand." Avoid introducing a pacifier to your baby while you are working to establish breastfeeding (the first 4-6 weeks after your baby is born). After this time you may choose to use a pacifier. Research has shown that pacifier use during the first year of a baby's life decreases the risk of sudden infant death syndrome (SIDS). Allow your baby to feed on each breast as long as he or she wants. Breastfeed until your baby is finished feeding. When your baby unlatches or falls asleep while feeding from the first breast, offer the second breast. Because newborns are often sleepy in the first few weeks of life, you may need to awaken your baby to get him or her to feed. Breastfeeding times will vary from baby to baby. However,  the following rules can serve as a guide to help you ensure that your baby is properly fed:  Newborns (babies 4 weeks of age or younger) may breastfeed every 1-3 hours.  Newborns should not go longer than 3 hours during the day or 5 hours during the night without breastfeeding.  You should breastfeed your baby a minimum of 8 times in a 24-hour period until you begin to introduce solid foods to your baby at around 6 months of age.  Breast milk pumping Pumping and storing breast milk allows you to ensure that your baby is exclusively fed your breast milk, even at times when you are unable to breastfeed. This is especially important if you are going back to work while you are still breastfeeding or when you are not able to be present during feedings. Your lactation consultant can give you guidelines on how long it is safe to store breast milk. A breast pump is a machine that allows you to pump milk from your breast into a sterile bottle. The pumped breast milk can then be stored in a refrigerator or freezer. Some breast pumps are operated by hand, while others use electricity. Ask your lactation consultant which type will work best for you. Breast pumps can be purchased, but some hospitals and breastfeeding support groups lease breast pumps on a monthly basis. A lactation consultant can teach you how to hand express breast milk, if you prefer not to use a pump. Caring for your breasts while you breastfeed Nipples can become dry, cracked, and sore while breastfeeding. The following recommendations can help keep your breasts moisturized and healthy:  Avoid using soap on your nipples.  Wear a supportive bra. Although not required, special nursing bras and tank tops are designed to allow access to your breasts for breastfeeding without taking off your entire bra or top. Avoid wearing underwire-style bras or extremely tight bras.  Air dry your nipples for 3-4minutes after each feeding.  Use only cotton  bra pads to absorb leaked breast milk. Leaking of breast milk between feedings is normal.  Use lanolin on your nipples after breastfeeding. Lanolin helps to maintain your skin's normal moisture barrier. If you use pure lanolin, you do not need to wash it off before feeding your baby again. Pure lanolin is not toxic to your baby. You may also hand express a few drops of breast milk and gently massage that milk into your nipples and allow the milk to air dry.  In the first few weeks after giving birth, some women experience extremely full breasts (engorgement). Engorgement can make your   breasts feel heavy, warm, and tender to the touch. Engorgement peaks within 3-5 days after you give birth. The following recommendations can help ease engorgement:  Completely empty your breasts while breastfeeding or pumping. You may want to start by applying warm, moist heat (in the shower or with warm water-soaked hand towels) just before feeding or pumping. This increases circulation and helps the milk flow. If your baby does not completely empty your breasts while breastfeeding, pump any extra milk after he or she is finished.  Wear a snug bra (nursing or regular) or tank top for 1-2 days to signal your body to slightly decrease milk production.  Apply ice packs to your breasts, unless this is too uncomfortable for you.  Make sure that your baby is latched on and positioned properly while breastfeeding.  If engorgement persists after 48 hours of following these recommendations, contact your health care provider or a lactation consultant. Overall health care recommendations while breastfeeding  Eat healthy foods. Alternate between meals and snacks, eating 3 of each per day. Because what you eat affects your breast milk, some of the foods may make your baby more irritable than usual. Avoid eating these foods if you are sure that they are negatively affecting your baby.  Drink milk, fruit juice, and water to  satisfy your thirst (about 10 glasses a day).  Rest often, relax, and continue to take your prenatal vitamins to prevent fatigue, stress, and anemia.  Continue breast self-awareness checks.  Avoid chewing and smoking tobacco. Chemicals from cigarettes that pass into breast milk and exposure to secondhand smoke may harm your baby.  Avoid alcohol and drug use, including marijuana. Some medicines that may be harmful to your baby can pass through breast milk. It is important to ask your health care provider before taking any medicine, including all over-the-counter and prescription medicine as well as vitamin and herbal supplements. It is possible to become pregnant while breastfeeding. If birth control is desired, ask your health care provider about options that will be safe for your baby. Contact a health care provider if:  You feel like you want to stop breastfeeding or have become frustrated with breastfeeding.  You have painful breasts or nipples.  Your nipples are cracked or bleeding.  Your breasts are red, tender, or warm.  You have a swollen area on either breast.  You have a fever or chills.  You have nausea or vomiting.  You have drainage other than breast milk from your nipples.  Your breasts do not become full before feedings by the fifth day after you give birth.  You feel sad and depressed.  Your baby is too sleepy to eat well.  Your baby is having trouble sleeping.  Your baby is wetting less than 3 diapers in a 24-hour period.  Your baby has less than 3 stools in a 24-hour period.  Your baby's skin or the white part of his or her eyes becomes yellow.  Your baby is not gaining weight by 5 days of age. Get help right away if:  Your baby is overly tired (lethargic) and does not want to wake up and feed.  Your baby develops an unexplained fever. This information is not intended to replace advice given to you by your health care provider. Make sure you discuss  any questions you have with your health care provider. Document Released: 12/25/2004 Document Revised: 06/08/2015 Document Reviewed: 06/18/2012 Elsevier Interactive Patient Education  2017 [employer]  

## 2016-11-23 NOTE — Progress Notes (Signed)
NST not indicated today as patient had one yesterday in [facility] after BPP in [facility] Patient informed of fetal echo appt on 11/29

## 2016-11-27 ENCOUNTER — Ambulatory Visit (INDEPENDENT_AMBULATORY_CARE_PROVIDER_SITE_OTHER): Payer: Self-pay | Admitting: Advanced Practice Midwife

## 2016-11-27 ENCOUNTER — Ambulatory Visit: Payer: Self-pay | Admitting: *Deleted

## 2016-11-27 ENCOUNTER — Encounter: Payer: Self-pay | Admitting: Advanced Practice Midwife

## 2016-11-27 ENCOUNTER — Ambulatory Visit (INDEPENDENT_AMBULATORY_CARE_PROVIDER_SITE_OTHER): Payer: Self-pay | Admitting: *Deleted

## 2016-11-27 ENCOUNTER — Encounter: Payer: Self-pay | Attending: Obstetrics and Gynecology | Admitting: *Deleted

## 2016-11-27 VITALS — BP 139/85 | HR 91 | Wt 220.1 lb

## 2016-11-27 DIAGNOSIS — O163 Unspecified maternal hypertension, third trimester: Secondary | ICD-10-CM

## 2016-11-27 DIAGNOSIS — O0993 Supervision of high risk pregnancy, unspecified, third trimester: Secondary | ICD-10-CM

## 2016-11-27 DIAGNOSIS — O09293 Supervision of pregnancy with other poor reproductive or obstetric history, third trimester: Secondary | ICD-10-CM

## 2016-11-27 DIAGNOSIS — O24414 Gestational diabetes mellitus in pregnancy, insulin controlled: Secondary | ICD-10-CM

## 2016-11-27 DIAGNOSIS — Z713 Dietary counseling and surveillance: Secondary | ICD-10-CM | POA: Insufficient documentation

## 2016-11-27 DIAGNOSIS — R7302 Impaired glucose tolerance (oral): Secondary | ICD-10-CM | POA: Insufficient documentation

## 2016-11-27 MED ORDER — ACCU-CHEK FASTCLIX LANCETS MISC: 1.0000 [IU] | Freq: Four times a day (QID) | 12 refills | Status: DC

## 2016-11-27 NOTE — Progress Notes (Signed)
BPP @ [facility]FM scheduled on 11/23

## 2016-11-27 NOTE — Patient Instructions (Signed)

## 2016-11-27 NOTE — Progress Notes (Signed)
   PRENATAL VISIT NOTE  Subjective:  [name] is a 46 y.o. G2P1000 at 5867w4d being seen today for ongoing prenatal care.  She is currently monitored for the following issues for this high-risk pregnancy and has Advanced maternal age in multigravida, second trimester; Hypertension complicating pregnancy; Placenta abruptio, antepartum; GBS (group B streptococcus) UTI complicating pregnancy; early Dx GDM (early 2nd trimester); Supervision of high-risk pregnancy; Abnormal genetic test in pregnancy; and Prior pregnancy with fetal demise and current pregnancy on their problem list.  Patient reports no complaints.  Contractions: Not present. Vag. Bleeding: None.  Movement: Present. Denies leaking of fluid.   The following portions of the patient's history were reviewed and updated as appropriate: allergies, current medications, past family history, past medical history, past social history, past surgical history and problem list. Problem list updated.  Objective:   Vitals:   11/27/16 1315  BP: 135/90  Pulse: 87  Weight: 220 lb 1.6 oz (99.8 kg)    Fetal Status: Fetal Heart Rate (bpm): 152   Movement: Present     General:  Alert, oriented and cooperative. Patient is in no acute distress.  Skin: Skin is warm and dry. No rash noted.   Cardiovascular: Normal heart rate noted  Respiratory: Normal respiratory effort, no problems with respiration noted  Abdomen: Soft, gravid, appropriate for gestational age.  Pain/Pressure: Absent     Pelvic: Cervical exam deferred        Extremities: Normal range of motion.  Edema: None  Mental Status:  Normal mood and affect. Normal behavior. Normal judgment and thought content.   Assessment and Plan:  Pregnancy: G2P1000 at 5267w4d  1. Supervision of high risk pregnancy in third trimester       Repeat BPs improved.  Per Dr Nira Retortegele, if not severe, no need to repeat labs (normal labs 5 days).   Scheduled for growth US this week.   2. Prior pregnancy with fetal  demise and current pregnancy in third trimester      Good fetal movement  3. Hypertension affecting pregnancy in third trimester       BP slightly elevated today       Repeat:  139/85   4. Insulin controlled gestational diabetes mellitus (GDM) in third trimester      FBS  95. 59. 62. 57. 81. 57.       B:    [id number]        L :    [phone] (chicken, salad, Not sure if she had other food, states ate late?).        D:     [phone].     Encouraged regular meal times and adherence to diet.  Praised that most are good  Preterm labor symptoms and general obstetric precautions including but not limited to vaginal bleeding, contractions, leaking of fluid and fetal movement were reviewed in detail with the patient. Please refer to After Visit Summary for other counseling recommendations.  Return in about 9 days (around 12/06/2016) for [facility] @ 0915.   Wynelle BourgeoisMarie Holger Sokolowski, CNM

## 2016-11-27 NOTE — Progress Notes (Signed)
Pt denies H/A or visual disturbances.  

## 2016-11-27 NOTE — Progress Notes (Signed)
  Patient was seen on 11/27/2016 for Gestational Diabetes self-management follow up visit. EDD 12/28/2016, at 35 weeks and 4 days. Patient here with [employer] interpretor. Patient states no education, unable to read or write. She has been able to record her BG on Log Sheet and states she is comfortable with giving her insulin shots. Insulin doses are being adjusted weekly by MD's. Since last increase in insulin her fasting BG are at 57-111 mg/dl, most of which are very close to 60 mg/dl. She states she does get shaky when BG are that low. I have reviewed hypoglycemia today including treatment options. She states she eats a banana when she is too low, she does not have juice in her house.  Plan:  Continue to check BG before breakfast and 2 hours after first bite of breakfast, lunch and dinner as directed by MD  Continue to bring Log Sheet to every medical appointment   Aim for 2-3 Carb Choices (starch, fruit or milk) at each meal and 1 at each snack Include protein with each meal and snack Take medication as directed by MD   Patient will be seen for follow-up PRN. Due date is in 1 month.

## 2016-11-28 NOTE — Telephone Encounter (Signed)
I called Dr. Casilda Carlsotton's office and advised that pt will not be able to keep appt on 11/29 as scheduled due to she has conflicting appts here on the same day for US and Ob visit.  Fetal echo rescheduled to 12/12 @ 1330. When pt returns to office on 11/29, I will discuss with provider to see if fetal echo still needed since delivery of baby will be planned for 12/14.

## 2016-11-30 ENCOUNTER — Ambulatory Visit (HOSPITAL_COMMUNITY)
Admission: RE | Admit: 2016-11-30 | Discharge: 2016-11-30 | Disposition: A | Discharge status: Home / Self Care | Payer: Self-pay | Source: Ambulatory Visit | Attending: Obstetrics & Gynecology | Admitting: Obstetrics & Gynecology

## 2016-11-30 ENCOUNTER — Other Ambulatory Visit (HOSPITAL_COMMUNITY): Payer: Self-pay | Admitting: Maternal and Fetal Medicine

## 2016-11-30 ENCOUNTER — Encounter (HOSPITAL_COMMUNITY): Payer: Self-pay

## 2016-11-30 DIAGNOSIS — O4590 Premature separation of placenta, unspecified, unspecified trimester: Secondary | ICD-10-CM

## 2016-11-30 DIAGNOSIS — Z3A36 36 weeks gestation of pregnancy: Secondary | ICD-10-CM

## 2016-11-30 DIAGNOSIS — O280 Abnormal hematological finding on antenatal screening of mother: Secondary | ICD-10-CM | POA: Insufficient documentation

## 2016-11-30 DIAGNOSIS — Z315 Encounter for genetic counseling: Secondary | ICD-10-CM | POA: Insufficient documentation

## 2016-11-30 DIAGNOSIS — O163 Unspecified maternal hypertension, third trimester: Secondary | ICD-10-CM

## 2016-11-30 DIAGNOSIS — O09522 Supervision of elderly multigravida, second trimester: Secondary | ICD-10-CM

## 2016-11-30 DIAGNOSIS — O10013 Pre-existing essential hypertension complicating pregnancy, third trimester: Secondary | ICD-10-CM | POA: Insufficient documentation

## 2016-11-30 DIAGNOSIS — O24414 Gestational diabetes mellitus in pregnancy, insulin controlled: Secondary | ICD-10-CM | POA: Insufficient documentation

## 2016-11-30 DIAGNOSIS — O09523 Supervision of elderly multigravida, third trimester: Secondary | ICD-10-CM | POA: Insufficient documentation

## 2016-11-30 DIAGNOSIS — O24419 Gestational diabetes mellitus in pregnancy, unspecified control: Secondary | ICD-10-CM

## 2016-11-30 DIAGNOSIS — O289 Unspecified abnormal findings on antenatal screening of mother: Secondary | ICD-10-CM | POA: Insufficient documentation

## 2016-12-06 ENCOUNTER — Encounter (HOSPITAL_COMMUNITY): Payer: Self-pay

## 2016-12-06 ENCOUNTER — Ambulatory Visit (INDEPENDENT_AMBULATORY_CARE_PROVIDER_SITE_OTHER): Payer: Self-pay | Admitting: Obstetrics & Gynecology

## 2016-12-06 ENCOUNTER — Ambulatory Visit (INDEPENDENT_AMBULATORY_CARE_PROVIDER_SITE_OTHER): Payer: Self-pay | Admitting: *Deleted

## 2016-12-06 ENCOUNTER — Ambulatory Visit (HOSPITAL_COMMUNITY)
Admission: RE | Admit: 2016-12-06 | Discharge: 2016-12-06 | Disposition: A | Discharge status: Home / Self Care | Payer: Self-pay | Source: Ambulatory Visit | Attending: Obstetrics & Gynecology | Admitting: Obstetrics & Gynecology

## 2016-12-06 ENCOUNTER — Other Ambulatory Visit (HOSPITAL_COMMUNITY): Payer: Self-pay | Admitting: Maternal and Fetal Medicine

## 2016-12-06 VITALS — BP 143/81 | HR 83 | Wt 221.6 lb

## 2016-12-06 DIAGNOSIS — O281 Abnormal biochemical finding on antenatal screening of mother: Secondary | ICD-10-CM | POA: Insufficient documentation

## 2016-12-06 DIAGNOSIS — O163 Unspecified maternal hypertension, third trimester: Secondary | ICD-10-CM

## 2016-12-06 DIAGNOSIS — O09293 Supervision of pregnancy with other poor reproductive or obstetric history, third trimester: Secondary | ICD-10-CM

## 2016-12-06 DIAGNOSIS — O09523 Supervision of elderly multigravida, third trimester: Secondary | ICD-10-CM

## 2016-12-06 DIAGNOSIS — O09522 Supervision of elderly multigravida, second trimester: Secondary | ICD-10-CM

## 2016-12-06 DIAGNOSIS — O0993 Supervision of high risk pregnancy, unspecified, third trimester: Secondary | ICD-10-CM

## 2016-12-06 DIAGNOSIS — Z3A36 36 weeks gestation of pregnancy: Secondary | ICD-10-CM | POA: Insufficient documentation

## 2016-12-06 DIAGNOSIS — O10013 Pre-existing essential hypertension complicating pregnancy, third trimester: Secondary | ICD-10-CM

## 2016-12-06 DIAGNOSIS — O24414 Gestational diabetes mellitus in pregnancy, insulin controlled: Secondary | ICD-10-CM

## 2016-12-06 DIAGNOSIS — O4590 Premature separation of placenta, unspecified, unspecified trimester: Secondary | ICD-10-CM

## 2016-12-06 DIAGNOSIS — O24419 Gestational diabetes mellitus in pregnancy, unspecified control: Secondary | ICD-10-CM

## 2016-12-06 MED ORDER — INSULIN NPH (HUMAN) (ISOPHANE) 100 UNIT/ML ~~LOC~~ SUSP: SUBCUTANEOUS | 11 refills | Status: DC

## 2016-12-06 NOTE — Addendum Note (Signed)
Addended by: Jaynie CollinsANYANWU, UGONNA A on: 12/06/2016 11:59 AM   Modules accepted: Orders

## 2016-12-06 NOTE — Progress Notes (Signed)
Pt had fetal echo today.  She states she was told "everything OK".  Pt denies H/A or visual disturbances. She has BPP @ MFM today.

## 2016-12-06 NOTE — Progress Notes (Addendum)
   PRENATAL VISIT NOTE  Subjective:  [name] is a 46 y.o. G2P1000 at 4327w6d being seen today for ongoing prenatal care.  Due to language barrier, an Arabic interpreter was present during the history-taking and subsequent discussion (and for part of the physical exam) with this patient. She is currently monitored for the following issues for this high-risk pregnancy and has Advanced maternal age in multigravida, second trimester; Hypertension complicating pregnancy; Placenta abruptio, antepartum; GBS (group B streptococcus) UTI complicating pregnancy; early Dx GDM (early 2nd trimester); Supervision of high-risk pregnancy; Abnormal genetic test in pregnancy; and Prior pregnancy with fetal demise and current pregnancy on their problem list.  Patient reports no complaints.  Contractions: Not present. Vag. Bleeding: None.  Movement: Present. Denies leaking of fluid.   The following portions of the patient's history were reviewed and updated as appropriate: allergies, current medications, past family history, past medical history, past social history, past surgical history and problem list. Problem list updated.  Objective:   Vitals:   12/06/16 1120  BP: (!) 143/81  Pulse: 83  Weight: 221 lb 9.6 oz (100.5 kg)    Fetal Status: Fetal Heart Rate (bpm): NST   Movement: Present     General:  Alert, oriented and cooperative. Patient is in no acute distress.  Skin: Skin is warm and dry. No rash noted.   Cardiovascular: Normal heart rate noted  Respiratory: Normal respiratory effort, no problems with respiration noted  Abdomen: Soft, gravid, appropriate for gestational age.  Pain/Pressure: Absent     Pelvic: Cervical exam deferred        Extremities: Normal range of motion.  Edema: None  Mental Status:  Normal mood and affect. Normal behavior. Normal judgment and thought content.    NST performed today was reviewed and was found to be reactive.  BPP to be done at MFM. Continue recommended  antenatal testing and prenatal care.  Assessment and Plan:  Pregnancy: G2P1000 at 6227w6d  1. Insulin controlled gestational diabetes mellitus (GDM) in third trimester 3 fasting values 150-160s, told to modify food choices at lunch.  Increased am NPH by 2 units to 50. Other values within range. Continue antenatal testing, IOL at 39 weeks.  - insulin NPH Human (HUMULIN N,NOVOLIN N) 100 UNIT/ML injection; Take 50 units before breakfast and 30 units at bedtime.  Dispense: 10 mL; Refill: 11  2. Prior pregnancy with fetal demise and current pregnancy in third trimester Continue antenatal testing, IOL at 39 weeks.   3. AMA (advanced maternal age) multigravida 35+, third trimester Continue antenatal testing.  4. Hypertension affecting pregnancy in third trimester Stable BP, no severe features.  Continue antenatal testing.  5. Supervision of high risk pregnancy in third trimester +GBS in urine, will need intrapartum prophylaxis. GC/Chlam to be checked today. Term labor symptoms and general obstetric precautions including but not limited to vaginal bleeding, contractions, leaking of fluid and fetal movement were reviewed in detail with the patient. Please refer to After Visit Summary for other counseling recommendations.  Return for OB visits and antenatal testing as scheduled.   Jaynie CollinsUgonna Candi Profit, MD

## 2016-12-06 NOTE — Patient Instructions (Signed)
Return to clinic for any scheduled appointments or obstetric concerns, or go to [facility] for evaluation  

## 2016-12-11 ENCOUNTER — Encounter: Payer: Self-pay | Admitting: *Deleted

## 2016-12-12 ENCOUNTER — Encounter (HOSPITAL_COMMUNITY): Payer: Self-pay

## 2016-12-13 ENCOUNTER — Ambulatory Visit (HOSPITAL_COMMUNITY)
Admission: RE | Admit: 2016-12-13 | Discharge: 2016-12-13 | Disposition: A | Discharge status: Home / Self Care | Payer: Medicaid Other | Source: Ambulatory Visit | Attending: Obstetrics & Gynecology | Admitting: Obstetrics & Gynecology

## 2016-12-13 ENCOUNTER — Encounter (HOSPITAL_COMMUNITY): Payer: Self-pay

## 2016-12-13 ENCOUNTER — Inpatient Hospital Stay (HOSPITAL_COMMUNITY)
Admission: AD | Admit: 2016-12-13 | Discharge: 2016-12-16 | DRG: 807 | Disposition: A | Discharge status: Home / Self Care | Payer: Medicaid Other | Source: Ambulatory Visit | Attending: Obstetrics & Gynecology | Admitting: Obstetrics & Gynecology

## 2016-12-13 ENCOUNTER — Encounter: Payer: Self-pay | Admitting: Obstetrics and Gynecology

## 2016-12-13 ENCOUNTER — Ambulatory Visit (INDEPENDENT_AMBULATORY_CARE_PROVIDER_SITE_OTHER): Payer: Self-pay | Admitting: Obstetrics and Gynecology

## 2016-12-13 ENCOUNTER — Other Ambulatory Visit (HOSPITAL_COMMUNITY): Payer: Self-pay | Admitting: *Deleted

## 2016-12-13 ENCOUNTER — Ambulatory Visit (INDEPENDENT_AMBULATORY_CARE_PROVIDER_SITE_OTHER): Payer: Self-pay | Admitting: *Deleted

## 2016-12-13 VITALS — BP 166/98 | HR 81 | Wt 225.8 lb

## 2016-12-13 DIAGNOSIS — O99824 Streptococcus B carrier state complicating childbirth: Secondary | ICD-10-CM | POA: Diagnosis present

## 2016-12-13 DIAGNOSIS — O2343 Unspecified infection of urinary tract in pregnancy, third trimester: Secondary | ICD-10-CM

## 2016-12-13 DIAGNOSIS — O10013 Pre-existing essential hypertension complicating pregnancy, third trimester: Secondary | ICD-10-CM | POA: Insufficient documentation

## 2016-12-13 DIAGNOSIS — O289 Unspecified abnormal findings on antenatal screening of mother: Secondary | ICD-10-CM | POA: Diagnosis not present

## 2016-12-13 DIAGNOSIS — O09293 Supervision of pregnancy with other poor reproductive or obstetric history, third trimester: Secondary | ICD-10-CM

## 2016-12-13 DIAGNOSIS — O24414 Gestational diabetes mellitus in pregnancy, insulin controlled: Secondary | ICD-10-CM

## 2016-12-13 DIAGNOSIS — O163 Unspecified maternal hypertension, third trimester: Secondary | ICD-10-CM

## 2016-12-13 DIAGNOSIS — O114 Pre-existing hypertension with pre-eclampsia, complicating childbirth: Principal | ICD-10-CM | POA: Diagnosis present

## 2016-12-13 DIAGNOSIS — Z3A37 37 weeks gestation of pregnancy: Secondary | ICD-10-CM | POA: Insufficient documentation

## 2016-12-13 DIAGNOSIS — O24424 Gestational diabetes mellitus in childbirth, insulin controlled: Secondary | ICD-10-CM | POA: Diagnosis present

## 2016-12-13 DIAGNOSIS — O24419 Gestational diabetes mellitus in pregnancy, unspecified control: Secondary | ICD-10-CM

## 2016-12-13 DIAGNOSIS — O09523 Supervision of elderly multigravida, third trimester: Secondary | ICD-10-CM | POA: Insufficient documentation

## 2016-12-13 DIAGNOSIS — O119 Pre-existing hypertension with pre-eclampsia, unspecified trimester: Secondary | ICD-10-CM

## 2016-12-13 DIAGNOSIS — O321XX0 Maternal care for breech presentation, not applicable or unspecified: Secondary | ICD-10-CM | POA: Diagnosis present

## 2016-12-13 DIAGNOSIS — O09522 Supervision of elderly multigravida, second trimester: Secondary | ICD-10-CM

## 2016-12-13 DIAGNOSIS — O0993 Supervision of high risk pregnancy, unspecified, third trimester: Secondary | ICD-10-CM

## 2016-12-13 DIAGNOSIS — O10019 Pre-existing essential hypertension complicating pregnancy, unspecified trimester: Secondary | ICD-10-CM | POA: Diagnosis present

## 2016-12-13 DIAGNOSIS — O1002 Pre-existing essential hypertension complicating childbirth: Secondary | ICD-10-CM | POA: Diagnosis present

## 2016-12-13 DIAGNOSIS — B951 Streptococcus, group B, as the cause of diseases classified elsewhere: Secondary | ICD-10-CM

## 2016-12-13 LAB — COMPREHENSIVE METABOLIC PANEL
ALBUMIN: 2.9 g/dL — AB (ref 3.5–5.0)
ALT: 16 U/L (ref 14–54)
ANION GAP: 8 (ref 5–15)
AST: 20 U/L (ref 15–41)
Alkaline Phosphatase: 103 U/L (ref 38–126)
BILIRUBIN TOTAL: 0.3 mg/dL (ref 0.3–1.2)
BUN: 11 mg/dL (ref 6–20)
CHLORIDE: 105 mmol/L (ref 101–111)
CO2: 18 mmol/L — ABNORMAL LOW (ref 22–32)
Calcium: 9.9 mg/dL (ref 8.9–10.3)
Creatinine, Ser: 0.44 mg/dL (ref 0.44–1.00)
GFR calc Af Amer: >60 mL/min (ref >60)
Glucose, Bld: 118 mg/dL — ABNORMAL HIGH (ref 65–99)
POTASSIUM: 4.7 mmol/L (ref 3.5–5.1)
Sodium: 131 mmol/L — ABNORMAL LOW (ref 135–145)
TOTAL PROTEIN: 6.4 g/dL — AB (ref 6.5–8.1)

## 2016-12-13 LAB — TYPE AND SCREEN
ABO/RH(D): O POS
Antibody Screen: NEGATIVE

## 2016-12-13 LAB — CBC
HEMATOCRIT: 40.5 % (ref 36.0–46.0)
Hemoglobin: 13.4 g/dL (ref 12.0–15.0)
MCH: 26.6 pg (ref 26.0–34.0)
MCHC: 33.1 g/dL (ref 30.0–36.0)
MCV: 80.4 fL (ref 78.0–100.0)
PLATELETS: 319 10*3/uL (ref 150–400)
RBC: 5.04 MIL/uL (ref 3.87–5.11)
RDW: 13.5 % (ref 11.5–15.5)
WBC: 7.1 10*3/uL (ref 4.0–10.5)

## 2016-12-13 LAB — PROTEIN / CREATININE RATIO, URINE
CREATININE, URINE: 18 mg/dL
PROTEIN CREATININE RATIO: 2.5 mg/mg{creat} — AB (ref 0.00–0.15)
Total Protein, Urine: 45 mg/dL

## 2016-12-13 LAB — GLUCOSE, CAPILLARY
Glucose-Capillary: 143 mg/dL — ABNORMAL HIGH (ref 65–99)
Glucose-Capillary: 149 mg/dL — ABNORMAL HIGH (ref 65–99)

## 2016-12-13 MED ORDER — SOD CITRATE-CITRIC ACID 500-334 MG/5ML PO SOLN: 30.0000 mL | ORAL | Status: DC | PRN

## 2016-12-13 MED ORDER — OXYTOCIN 40 UNITS IN LACTATED RINGERS INFUSION - SIMPLE MED: 2.5000 [IU]/h | INTRAVENOUS | Status: DC

## 2016-12-13 MED ORDER — PENICILLIN G POT IN DEXTROSE 60000 UNIT/ML IV SOLN
3.0000 10*6.[IU] | INTRAVENOUS | Status: DC
  Administered 2016-12-14 (×4): 3 10*6.[IU] via INTRAVENOUS
  Filled 2016-12-13 (×9): qty 50

## 2016-12-13 MED ORDER — ACETAMINOPHEN 325 MG PO TABS
650.0000 mg | ORAL_TABLET | ORAL | Status: DC | PRN
  Administered 2016-12-14: 650 mg via ORAL
  Filled 2016-12-13: qty 2

## 2016-12-13 MED ORDER — OXYCODONE-ACETAMINOPHEN 5-325 MG PO TABS: 2.0000 | ORAL_TABLET | ORAL | Status: DC | PRN

## 2016-12-13 MED ORDER — LIDOCAINE HCL (PF) 1 % IJ SOLN: 30.0000 mL | INTRAMUSCULAR | Status: DC | PRN

## 2016-12-13 MED ORDER — ONDANSETRON HCL 4 MG/2ML IJ SOLN: 4.0000 mg | Freq: Four times a day (QID) | INTRAMUSCULAR | Status: DC | PRN

## 2016-12-13 MED ORDER — TERBUTALINE SULFATE 1 MG/ML IJ SOLN
INTRAMUSCULAR | Status: AC
  Filled 2016-12-13: qty 1

## 2016-12-13 MED ORDER — MAGNESIUM SULFATE BOLUS VIA INFUSION
4.0000 g | Freq: Once | INTRAVENOUS | Status: AC
Start: 2016-12-13 — End: 2016-12-13
  Administered 2016-12-13: 4 g via INTRAVENOUS
  Filled 2016-12-13: qty 500

## 2016-12-13 MED ORDER — LACTATED RINGERS IV SOLN
INTRAVENOUS | Status: DC
  Administered 2016-12-14: via INTRAVENOUS

## 2016-12-13 MED ORDER — OXYCODONE-ACETAMINOPHEN 5-325 MG PO TABS: 1.0000 | ORAL_TABLET | ORAL | Status: DC | PRN

## 2016-12-13 MED ORDER — ACETAMINOPHEN 325 MG PO TABS: 650.0000 mg | ORAL_TABLET | ORAL | Status: DC | PRN

## 2016-12-13 MED ORDER — MAGNESIUM SULFATE 40 G IN LACTATED RINGERS - SIMPLE
2.0000 g/h | INTRAVENOUS | Status: DC
  Administered 2016-12-14: 2 g/h via INTRAVENOUS
  Filled 2016-12-13: qty 40
  Filled 2016-12-13: qty 500

## 2016-12-13 MED ORDER — LACTATED RINGERS IV SOLN: INTRAVENOUS | Status: DC

## 2016-12-13 MED ORDER — LACTATED RINGERS IV SOLN
INTRAVENOUS | Status: DC
  Administered 2016-12-13 – 2016-12-14 (×2): via INTRAVENOUS

## 2016-12-13 MED ORDER — HYDRALAZINE HCL 20 MG/ML IJ SOLN
10.0000 mg | Freq: Once | INTRAMUSCULAR | Status: AC | PRN
  Administered 2016-12-14: 10 mg via INTRAVENOUS
  Filled 2016-12-13: qty 1

## 2016-12-13 MED ORDER — LABETALOL HCL 5 MG/ML IV SOLN
20.0000 mg | INTRAVENOUS | Status: AC | PRN
  Administered 2016-12-13: 20 mg via INTRAVENOUS
  Administered 2016-12-13: 40 mg via INTRAVENOUS
  Administered 2016-12-14: 80 mg via INTRAVENOUS
  Filled 2016-12-13: qty 4
  Filled 2016-12-13: qty 16
  Filled 2016-12-13: qty 8

## 2016-12-13 MED ORDER — LACTATED RINGERS IV SOLN: 500.0000 mL | INTRAVENOUS | Status: DC | PRN

## 2016-12-13 MED ORDER — SOD CITRATE-CITRIC ACID 500-334 MG/5ML PO SOLN
30.0000 mL | ORAL | Status: DC | PRN
  Administered 2016-12-13 – 2016-12-14 (×2): 30 mL via ORAL
  Filled 2016-12-13 (×3): qty 15

## 2016-12-13 MED ORDER — MISOPROSTOL 25 MCG QUARTER TABLET
25.0000 ug | ORAL_TABLET | ORAL | Status: DC
  Administered 2016-12-13: 25 ug via VAGINAL
  Filled 2016-12-13 (×5): qty 1

## 2016-12-13 MED ORDER — TERBUTALINE SULFATE 1 MG/ML IJ SOLN
0.2500 mg | Freq: Once | INTRAMUSCULAR | Status: AC
  Administered 2016-12-13: 0.25 mg via SUBCUTANEOUS

## 2016-12-13 MED ORDER — PENICILLIN G POTASSIUM 5000000 UNITS IJ SOLR
5.0000 10*6.[IU] | Freq: Once | INTRAVENOUS | Status: AC
  Administered 2016-12-13: 5 10*6.[IU] via INTRAVENOUS
  Filled 2016-12-13: qty 5

## 2016-12-13 MED ORDER — OXYTOCIN BOLUS FROM INFUSION
500.0000 mL | Freq: Once | INTRAVENOUS | Status: AC
  Administered 2016-12-14: 500 mL via INTRAVENOUS

## 2016-12-13 MED ORDER — LIDOCAINE HCL (PF) 1 % IJ SOLN
30.0000 mL | INTRAMUSCULAR | Status: DC | PRN
  Filled 2016-12-13: qty 30

## 2016-12-13 MED ORDER — OXYTOCIN BOLUS FROM INFUSION: 500.0000 mL | Freq: Once | INTRAVENOUS | Status: DC

## 2016-12-13 NOTE — Progress Notes (Signed)
   PRENATAL VISIT NOTE  Subjective:  [name] is a 46 y.o. G2P1000 at 633w6d being seen today for ongoing prenatal care.  She is currently monitored for the following issues for this high-risk pregnancy and has Advanced maternal age in multigravida, second trimester; Hypertension complicating pregnancy; Placenta abruptio, antepartum; GBS (group B streptococcus) UTI complicating pregnancy; early Dx GDM (early 2nd trimester); Supervision of high-risk pregnancy; Abnormal genetic test in pregnancy; and Prior pregnancy with fetal demise and current pregnancy on their problem list.  Patient reports no complaints.  Contractions: Not present. Vag. Bleeding: None.  Movement: Present. Denies leaking of fluid.   The following portions of the patient's history were reviewed and updated as appropriate: allergies, current medications, past family history, past medical history, past social history, past surgical history and problem list. Problem list updated.  Objective:   Vitals:   12/13/16 1100  BP: (!) 164/90  Pulse: 81  Weight: 225 lb 12.8 oz (102.4 kg)    Fetal Status: Fetal Heart Rate (bpm): NST   Movement: Present     General:  Alert, oriented and cooperative. Patient is in no acute distress.  Skin: Skin is warm and dry. No rash noted.   Cardiovascular: Normal heart rate noted  Respiratory: Normal respiratory effort, no problems with respiration noted  Abdomen: Soft, gravid, appropriate for gestational age.  Pain/Pressure: Present     Pelvic: Cervical exam deferred        Extremities: Normal range of motion.     Mental Status:  Normal mood and affect. Normal behavior. Normal judgment and thought content.   Assessment and Plan:  Pregnancy: G2P1000 at 143w6d  1. Supervision of high risk pregnancy in third trimester Patient is doing well without complaints Patient was informed that fetus is still in a breech position. Discussed ECV vs PCS. Patient opted with ECV following review of risks  and benefits of both options. Patient will be scheduled for tomorrow morning  2. early Dx GDM (early 2nd trimester) CBGs reviewed and all within range. Continue with NPH 50 in am and 30 at bedtime Follow up growth ultrasound 12/13 Patient had BPP earlier this morning (report not yet available).  NST reviewed and reactive  3. Hypertension affecting pregnancy in third trimester Patient with elevated BP in office, will repeat prior to her departure. BP was stable while in MFM Repeat BP 166/98. Patient sent to MAU for further evaluation  4. Prior pregnancy with fetal demise and current pregnancy in third trimester Continue antenatal testing with plan for delivery by 39 weeks  5. Advanced maternal age in multigravida, second trimester Abnormal quad screen Declined NIPS  6. Group B Streptococcus urinary tract infection affecting pregnancy in third trimester Will provide prophylaxis in labor  Term labor symptoms and general obstetric precautions including but not limited to vaginal bleeding, contractions, leaking of fluid and fetal movement were reviewed in detail with the patient. Please refer to After Visit Summary for other counseling recommendations.  No Follow-up on file.   Catalina AntiguaPeggy Loran Auguste, MD

## 2016-12-13 NOTE — Progress Notes (Signed)
US for growth and BPP done today.   Pt is concerned about fetal position - breech. Pt states she has a headache now - pain scale 1. She denies visual disturbances.  Pt to go to MAU for further evaluation due to elevated BP. Report called to MAU.

## 2016-12-13 NOTE — MAU Note (Signed)
p sent up from clinic for elevated B/P. Haas gestation DM and HTN. Had slight headache earlier today but none now. Denies and blurred vision or spots.

## 2016-12-13 NOTE — MAU Note (Signed)
Urine in lab 

## 2016-12-13 NOTE — Anesthesia Pain Management Evaluation Note (Signed)
  CRNA Pain Management Visit Note  Patient: [name], 46 y.o., female  "Hello I am a member of the anesthesia team at [facility]. We have an anesthesia team available at all times to provide care throughout the hospital, including epidural management and anesthesia for C-section. I don't know your plan for the delivery whether it a natural birth, water birth, IV sedation, nitrous supplementation, doula or epidural, but we want to meet your pain goals."   1.Was your pain managed to your expectations on prior hospitalizations?   Yes   2.What is your expectation for pain management during this hospitalization?     IV pain meds  3.How can we help you reach that goal? IV pain Rx  Record the patient's initial score and the patient's pain goal.   Pain: 0  Pain Goal: 7 [facility] wants you to be able to say your pain was always managed very well.  [name] 12/13/2016

## 2016-12-13 NOTE — Progress Notes (Signed)
Patient ID: Desiree PeachRakia Lemmons, female   DOB: 12/08/1970, 46 y.o.   MRN: [id number] After informed verbal consent, Terbutaline 0.25 mg SQ given, ECV was attempted under Ultrasound guidance.  Forward roll x 1 done.  ECV was successful. FHR was reactive before and after the procedure.   Pt. Tolerated the procedure well.

## 2016-12-13 NOTE — H&P (Signed)
HPI  [name] is a 46 y.o. female G2P1000 @ 5991w6d with questionable chronic HTN,  here in MAU with elevated BP readings. She was in the office today with elevated BP's. She denies visual changes, abdominal pain, or HA. + fetal movement.  Baby is breech, she is scheduled for a version tomorrow.   Hausa interpretor at bedside.           OB History    Gravida Para Term Preterm AB Living   2 1 1      0   SAB TAB Ectopic Multiple Live Births           1          Past Medical History:  Diagnosis Date  . Gestational diabetes   . Hypertension          Past Surgical History:  Procedure Laterality Date  . DILATION AND CURETTAGE OF UTERUS    . HYSTEROSCOPY WITH RESECTOSCOPE  2011         Family History  Problem Relation Age of Onset  . Alcohol abuse Neg Hx     Social History       Tobacco Use  . Smoking status: Never Smoker  . Smokeless tobacco: Never Used  Substance Use Topics  . Alcohol use: No  . Drug use: No    Allergies: No Known Allergies         Medications Prior to Admission  Medication Sig Dispense Refill Last Dose  . ACCU-CHEK FASTCLIX LANCETS MISC 1 Units by Percutaneous route 4 (four) times daily. 100 each 12 Taking  . acetaminophen (TYLENOL) 500 MG tablet Take 1,000 mg every 6 (six) hours as needed by mouth for mild pain or headache.   Taking  . docusate sodium (COLACE) 100 MG capsule Take 1 capsule (100 mg total) by mouth 2 (two) times daily as needed for mild constipation. 60 capsule 3 Taking  . insulin NPH Human (HUMULIN N,NOVOLIN N) 100 UNIT/ML injection Take 50 units before breakfast and 30 units at bedtime. 10 mL 11 Taking  . insulin regular (NOVOLIN R,HUMULIN R) 100 units/mL injection Take 32 units before breakfast and 32 units before dinner 10 mL 11 Taking  . Insulin Syringe-Needle U-100 (INSULIN SYRINGE 1CC/31GX5/16") 31G X 5/16" 1 ML MISC 1 Units by Does not apply route 2 (two) times daily. 100 each 11 Taking  .  iron polysaccharides (NIFEREX) 150 MG capsule Take 1 capsule (150 mg total) by mouth daily. 90 capsule 1 Taking  . Prenatal Vit-Fe Fumarate-FA (MULTIVITAMIN-PRENATAL) 27-0.8 MG TABS tablet Take 1 tablet by mouth daily at 12 noon.   Taking   LabResultsLast48Hours        Results for orders placed or performed during the hospital encounter of 12/13/16 (from the past 48 hour(s))  Protein / creatinine ratio, urine     Status: Abnormal   Collection Time: 12/13/16 12:30 PM  Result Value Ref Range   Creatinine, Urine 18.00 mg/dL   Total Protein, Urine 45 mg/dL    Comment: NO NORMAL RANGE ESTABLISHED FOR THIS TEST   Protein Creatinine Ratio 2.50 (H) 0.00 - 0.15 mg/mg[Cre]     Review of Systems  Eyes: Negative for photophobia and visual disturbance.  Gastrointestinal: Negative for abdominal pain.  Neurological: Negative for headaches.   Physical Exam   Blood pressure (!) 152/91, pulse 87, temperature 98.2 F (36.8 C), resp. rate 18, last menstrual period 04/10/2016.   Patient Vitals for the past 24 hrs:  BP Temp Pulse Resp  12/13/16 1354 (!) 152/91 - 87 -  12/13/16 1331 (!) 156/91 - 85 -  12/13/16 1317 (!) 160/94 - 88 -  12/13/16 1248 (!) 163/93 98.2 F (36.8 C) 89 18    Physical Exam  Constitutional: She is oriented to person, place, and time. She appears well-developed and well-nourished. No distress.  HENT:  Head: Normocephalic.  Eyes: Pupils are equal, round, and reactive to light.  GI: Soft. She exhibits no distension. There is no tenderness. There is no rebound.  Musculoskeletal: Normal range of motion.  Neurological: She is alert and oriented to person, place, and time. She displays normal reflexes.  Negative clonus   Skin: Skin is warm. She is not diaphoretic.   Fetal Tracing: Baseline: 135 bpm Variability: Moderate  Accelerations: 15x15 Decelerations: None Toco: Occasional   MAU Course  Procedures  None  MDM  PIH labs collected.   Discussed with Dr. Shawnie PonsPratt Labetalol protocol initiated.  Admit to L&D  Assessment and Plan   A:  1. Chronic hypertension with superimposed pre-eclampsia     P:  Continue labetalol protocol Admit to labor and delivery ?Version Vs. Cesarean section Dr. Francesco RunnerPrat to attempt ECV upon admission to L&D  Raelyn MoraRolitta Jammie Clink, CNM  12/13/2016

## 2016-12-13 NOTE — Progress Notes (Signed)
Patient's personal interpreter, Skipper ClicheAmina Tahirou, present at all times for all procedures and conversations. Chairty Toman BowmanBrown Rashad Obeid, [place]RN 4:24 PM 12/13/2016

## 2016-12-13 NOTE — MAU Provider Note (Signed)
History     CSN: [id number]  Arrival date and time: 12/13/16 1235   First Provider Initiated Contact with Patient 12/13/16 1344      Chief Complaint  Patient presents with  . Hypertension   HPI  Ms.Cardell PeachRakia Gasca is a 46 y.o. female G2P1000 @ 5868w6d with questionable chronic HTN,  here in MAU with elevated BP readings. She was in the office today with elevated BP's. She denies visual changes, abdominal pain, or HA. + fetal movement.  Baby is breech, she is scheduled for a version tomorrow.   Hausa interpretor at bedside.   OB History    Gravida Para Term Preterm AB Living   2 1 1      0   SAB TAB Ectopic Multiple Live Births           1      Past Medical History:  Diagnosis Date  . Gestational diabetes   . Hypertension     Past Surgical History:  Procedure Laterality Date  . DILATION AND CURETTAGE OF UTERUS    . HYSTEROSCOPY WITH RESECTOSCOPE  2011    Family History  Problem Relation Age of Onset  . Alcohol abuse Neg Hx     Social History   Tobacco Use  . Smoking status: Never Smoker  . Smokeless tobacco: Never Used  Substance Use Topics  . Alcohol use: No  . Drug use: No    Allergies: No Known Allergies  Medications Prior to Admission  Medication Sig Dispense Refill Last Dose  . ACCU-CHEK FASTCLIX LANCETS MISC 1 Units by Percutaneous route 4 (four) times daily. 100 each 12 Taking  . acetaminophen (TYLENOL) 500 MG tablet Take 1,000 mg every 6 (six) hours as needed by mouth for mild pain or headache.   Taking  . docusate sodium (COLACE) 100 MG capsule Take 1 capsule (100 mg total) by mouth 2 (two) times daily as needed for mild constipation. 60 capsule 3 Taking  . insulin NPH Human (HUMULIN N,NOVOLIN N) 100 UNIT/ML injection Take 50 units before breakfast and 30 units at bedtime. 10 mL 11 Taking  . insulin regular (NOVOLIN R,HUMULIN R) 100 units/mL injection Take 32 units before breakfast and 32 units before dinner 10 mL 11 Taking  . Insulin Syringe-Needle  U-100 (INSULIN SYRINGE 1CC/31GX5/16") 31G X 5/16" 1 ML MISC 1 Units by Does not apply route 2 (two) times daily. 100 each 11 Taking  . iron polysaccharides (NIFEREX) 150 MG capsule Take 1 capsule (150 mg total) by mouth daily. 90 capsule 1 Taking  . Prenatal Vit-Fe Fumarate-FA (MULTIVITAMIN-PRENATAL) 27-0.8 MG TABS tablet Take 1 tablet by mouth daily at 12 noon.   Taking   Results for orders placed or performed during the hospital encounter of 12/13/16 (from the past 48 hour(s))  Protein / creatinine ratio, urine     Status: Abnormal   Collection Time: 12/13/16 12:30 PM  Result Value Ref Range   Creatinine, Urine 18.00 mg/dL   Total Protein, Urine 45 mg/dL    Comment: NO NORMAL RANGE ESTABLISHED FOR THIS TEST   Protein Creatinine Ratio 2.50 (H) 0.00 - 0.15 mg/mg[Cre]   Review of Systems  Eyes: Negative for photophobia and visual disturbance.  Gastrointestinal: Negative for abdominal pain.  Neurological: Negative for headaches.   Physical Exam   Blood pressure (!) 152/91, pulse 87, temperature 98.2 F (36.8 C), resp. rate 18, last menstrual period 04/10/2016.   Patient Vitals for the past 24 hrs:  BP Temp Pulse Resp  12/13/16 1354 Marland Kitchen(!)  152/91 - 87 -  12/13/16 1331 (!) 156/91 - 85 -  12/13/16 1317 (!) 160/94 - 88 -  12/13/16 1248 (!) 163/93 98.2 F (36.8 C) 89 18    Physical Exam  Constitutional: She is oriented to person, place, and time. She appears well-developed and well-nourished. No distress.  HENT:  Head: Normocephalic.  Eyes: Pupils are equal, round, and reactive to light.  GI: Soft. She exhibits no distension. There is no tenderness. There is no rebound.  Musculoskeletal: Normal range of motion.  Neurological: She is alert and oriented to person, place, and time. She displays normal reflexes.  Negative clonus   Skin: Skin is warm. She is not diaphoretic.   Fetal Tracing: Baseline: 135 bpm Variability: Moderate  Accelerations: 15x15 Decelerations: None Toco:  Occasional   MAU Course  Procedures  None  MDM  PIH labs collected.  Discussed with Dr. Shawnie PonsPratt Labetalol protocol initiated.  Admit to L&D  Assessment and Plan   A:  1. Chronic hypertension with superimposed pre-eclampsia     P:  Continue labetalol protocol Admit to labor and delivery ?Version Vs. Cesarean section  Shariya Gaster, Harolyn RutherfordJennifer I, NP 12/13/2016 2:10 PM

## 2016-12-13 NOTE — Progress Notes (Signed)
Foley inserted and inflated w/60cc H20. FHR Cat 1.  Occ ctx.  Vitals:   12/13/16 1627 12/13/16 1742 12/13/16 1830 12/13/16 1926  BP: (!) 147/94 137/86 (!) 150/85   Pulse: 80 86 97   Resp:    16  Temp:    99 F (37.2 C)  TempSrc:    Oral      Patient Vitals for the past 6 hrs:  BP Temp Temp src Pulse Resp  12/13/16 1926 - 99 F (37.2 C) Oral - 16  12/13/16 1830 (!) 150/85 - - 97 -  12/13/16 1742 137/86 - - 86 -  12/13/16 1627 (!) 147/94 - - 80 -  12/13/16 1626 (!) 160/88 - - 79 -  12/13/16 1525 130/86 98.6 F (37 C) Oral 89 -  12/13/16 1501 (!) 161/104 - - 91 -    Will start pitocin when Foley falls out. Will need to start MgSO4 if BP in severe (160/110) range.

## 2016-12-14 ENCOUNTER — Other Ambulatory Visit: Payer: Self-pay

## 2016-12-14 ENCOUNTER — Encounter (HOSPITAL_COMMUNITY): Payer: Self-pay

## 2016-12-14 ENCOUNTER — Encounter (HOSPITAL_COMMUNITY): Payer: Self-pay | Admitting: Obstetrics & Gynecology

## 2016-12-14 DIAGNOSIS — R030 Elevated blood-pressure reading, without diagnosis of hypertension: Secondary | ICD-10-CM | POA: Diagnosis present

## 2016-12-14 DIAGNOSIS — O24424 Gestational diabetes mellitus in childbirth, insulin controlled: Secondary | ICD-10-CM | POA: Diagnosis present

## 2016-12-14 DIAGNOSIS — O1002 Pre-existing essential hypertension complicating childbirth: Secondary | ICD-10-CM | POA: Diagnosis present

## 2016-12-14 DIAGNOSIS — O99824 Streptococcus B carrier state complicating childbirth: Secondary | ICD-10-CM | POA: Diagnosis present

## 2016-12-14 DIAGNOSIS — Z3A37 37 weeks gestation of pregnancy: Secondary | ICD-10-CM

## 2016-12-14 DIAGNOSIS — O114 Pre-existing hypertension with pre-eclampsia, complicating childbirth: Secondary | ICD-10-CM | POA: Diagnosis present

## 2016-12-14 DIAGNOSIS — O321XX0 Maternal care for breech presentation, not applicable or unspecified: Secondary | ICD-10-CM | POA: Diagnosis present

## 2016-12-14 LAB — GLUCOSE, CAPILLARY
GLUCOSE-CAPILLARY: 85 mg/dL (ref 65–99)
GLUCOSE-CAPILLARY: 134 mg/dL — AB (ref 65–99)
Glucose-Capillary: 103 mg/dL — ABNORMAL HIGH (ref 65–99)
Glucose-Capillary: 111 mg/dL — ABNORMAL HIGH (ref 65–99)
Glucose-Capillary: 147 mg/dL — ABNORMAL HIGH (ref 65–99)

## 2016-12-14 LAB — RPR: RPR Ser Ql: NONREACTIVE

## 2016-12-14 MED ORDER — DIBUCAINE 1 % RE OINT: 1.0000 "application " | TOPICAL_OINTMENT | RECTAL | Status: DC | PRN

## 2016-12-14 MED ORDER — TERBUTALINE SULFATE 1 MG/ML IJ SOLN: 0.2500 mg | Freq: Once | INTRAMUSCULAR | Status: DC | PRN

## 2016-12-14 MED ORDER — INSULIN REGULAR BOLUS VIA INFUSION
0.0000 [IU] | Freq: Three times a day (TID) | INTRAVENOUS | Status: DC
  Filled 2016-12-14: qty 10

## 2016-12-14 MED ORDER — OXYCODONE HCL 5 MG PO TABS: 5.0000 mg | ORAL_TABLET | ORAL | Status: DC | PRN

## 2016-12-14 MED ORDER — AMLODIPINE BESYLATE 10 MG PO TABS
10.0000 mg | ORAL_TABLET | Freq: Every day | ORAL | Status: DC
  Administered 2016-12-14 – 2016-12-16 (×3): 10 mg via ORAL
  Filled 2016-12-14 (×3): qty 1

## 2016-12-14 MED ORDER — BENZOCAINE-MENTHOL 20-0.5 % EX AERO
1.0000 "application " | INHALATION_SPRAY | CUTANEOUS | Status: DC | PRN
  Administered 2016-12-14 – 2016-12-15 (×2): 1 via TOPICAL
  Filled 2016-12-14 (×2): qty 56

## 2016-12-14 MED ORDER — LABETALOL HCL 5 MG/ML IV SOLN
INTRAVENOUS | Status: AC
  Administered 2016-12-14: 20 mg via INTRAVENOUS
  Filled 2016-12-14: qty 4

## 2016-12-14 MED ORDER — DEXTROSE 50 % IV SOLN: 25.0000 mL | INTRAVENOUS | Status: DC | PRN

## 2016-12-14 MED ORDER — PRENATAL MULTIVITAMIN CH
1.0000 | ORAL_TABLET | Freq: Every day | ORAL | Status: DC
  Administered 2016-12-15 – 2016-12-16 (×2): 1 via ORAL
  Filled 2016-12-14 (×2): qty 1

## 2016-12-14 MED ORDER — HYDRALAZINE HCL 20 MG/ML IJ SOLN: 10.0000 mg | Freq: Once | INTRAMUSCULAR | Status: DC | PRN

## 2016-12-14 MED ORDER — ONDANSETRON HCL 4 MG/2ML IJ SOLN: 4.0000 mg | INTRAMUSCULAR | Status: DC | PRN

## 2016-12-14 MED ORDER — OXYTOCIN 40 UNITS IN LACTATED RINGERS INFUSION - SIMPLE MED
1.0000 m[IU]/min | INTRAVENOUS | Status: DC
  Administered 2016-12-14: 2 m[IU]/min via INTRAVENOUS
  Filled 2016-12-14: qty 1000

## 2016-12-14 MED ORDER — DIPHENHYDRAMINE HCL 25 MG PO CAPS: 25.0000 mg | ORAL_CAPSULE | Freq: Four times a day (QID) | ORAL | Status: DC | PRN

## 2016-12-14 MED ORDER — ZOLPIDEM TARTRATE 5 MG PO TABS: 5.0000 mg | ORAL_TABLET | Freq: Every evening | ORAL | Status: DC | PRN

## 2016-12-14 MED ORDER — ACETAMINOPHEN 325 MG PO TABS: 650.0000 mg | ORAL_TABLET | ORAL | Status: DC | PRN

## 2016-12-14 MED ORDER — WITCH HAZEL-GLYCERIN EX PADS: 1.0000 "application " | MEDICATED_PAD | CUTANEOUS | Status: DC | PRN

## 2016-12-14 MED ORDER — DEXTROSE-NACL 5-0.45 % IV SOLN: INTRAVENOUS | Status: DC

## 2016-12-14 MED ORDER — SIMETHICONE 80 MG PO CHEW: 80.0000 mg | CHEWABLE_TABLET | ORAL | Status: DC | PRN

## 2016-12-14 MED ORDER — SODIUM CHLORIDE 0.9 % IV SOLN
INTRAVENOUS | Status: DC
  Filled 2016-12-14: qty 1

## 2016-12-14 MED ORDER — SODIUM CHLORIDE 0.9 % IV SOLN: INTRAVENOUS | Status: DC

## 2016-12-14 MED ORDER — MAGNESIUM SULFATE 40 G IN LACTATED RINGERS - SIMPLE
2.0000 g/h | INTRAVENOUS | Status: AC
  Filled 2016-12-14 (×2): qty 500

## 2016-12-14 MED ORDER — ONDANSETRON HCL 4 MG PO TABS
4.0000 mg | ORAL_TABLET | ORAL | Status: DC | PRN
Start: 2016-12-14 — End: 2016-12-16

## 2016-12-14 MED ORDER — IBUPROFEN 600 MG PO TABS
600.0000 mg | ORAL_TABLET | Freq: Four times a day (QID) | ORAL | Status: DC
  Administered 2016-12-14 – 2016-12-16 (×7): 600 mg via ORAL
  Filled 2016-12-14 (×7): qty 1

## 2016-12-14 MED ORDER — COCONUT OIL OIL
1.0000 "application " | TOPICAL_OIL | Status: DC | PRN
  Administered 2016-12-14: 1 via TOPICAL
  Filled 2016-12-14: qty 120

## 2016-12-14 MED ORDER — FENTANYL CITRATE (PF) 100 MCG/2ML IJ SOLN
100.0000 ug | INTRAMUSCULAR | Status: DC | PRN
  Administered 2016-12-14 (×5): 100 ug via INTRAVENOUS
  Filled 2016-12-14 (×4): qty 2

## 2016-12-14 MED ORDER — FENTANYL CITRATE (PF) 100 MCG/2ML IJ SOLN
INTRAMUSCULAR | Status: AC
  Administered 2016-12-14: 100 ug via INTRAVENOUS
  Filled 2016-12-14: qty 2

## 2016-12-14 MED ORDER — TETANUS-DIPHTH-ACELL PERTUSSIS 5-2.5-18.5 LF-MCG/0.5 IM SUSP: 0.5000 mL | Freq: Once | INTRAMUSCULAR | Status: DC

## 2016-12-14 MED ORDER — SENNOSIDES-DOCUSATE SODIUM 8.6-50 MG PO TABS
2.0000 | ORAL_TABLET | ORAL | Status: DC
  Administered 2016-12-15 (×2): 2 via ORAL
  Filled 2016-12-14 (×2): qty 2

## 2016-12-14 MED ORDER — LABETALOL HCL 5 MG/ML IV SOLN
20.0000 mg | INTRAVENOUS | Status: DC | PRN
  Administered 2016-12-14: 20 mg via INTRAVENOUS
  Administered 2016-12-14: 40 mg via INTRAVENOUS
  Filled 2016-12-14: qty 8

## 2016-12-14 NOTE — Plan of Care (Signed)
  Education: Knowledge of General Education information will improve 12/14/2016 2228 - Progressing by Bobbye MortonAcheampong, Varnell Orvis, RN Note Interpreter at bedside, pt educated about  environment, medication, personal care and baby care.   Elimination: Will not experience complications related to urinary retention 12/14/2016 2228 by Bobbye MortonAcheampong, Jasper Hanf, RN Note Pt educated on the need for intake and output..Marland Kitchen

## 2016-12-14 NOTE — Progress Notes (Signed)
Pt transferred to HROB 305 at 2000 on 2g of mag.  Will continue to monitor. Called MD (Dr Debroah LoopArnold) for clarification of mag orders.

## 2016-12-14 NOTE — Progress Notes (Signed)
LABOR PROGRESS NOTE  [name] is a 46 y.o. G2P1000 at 7223w0d  admitted for IOL for CHTN   Subjective: Patient breathing through contractions, does not feel any pressure in bottom   Objective: BP 125/66   Pulse 84   Temp 98.1 F (36.7 C) (Oral)   Resp 18   Ht 5\' 6"  (1.676 m)   Wt 225 lb (102.1 kg)   LMP 04/10/2016   BMI 36.32 kg/m  or  Vitals:   12/14/16 1225 12/14/16 1231 12/14/16 1301 12/14/16 1345  BP: (!) 149/76 (!) 146/72 133/70 125/66  Pulse: 79 84 89 84  Resp:   18   Temp:      TempSrc:      Weight:      Height:         Dilation: 8 Effacement (%): 80 Cervical Position: Posterior Station: -3(bag is bulging thru cervix; difficult to feel for exam) Presentation: Vertex Exam by:: middleton  FHT: baseline rate 130, moderate varibility, + acel, early and variable decel Toco: 2-3  Labs: Lab Results  Component Value Date   WBC 7.1 12/13/2016   HGB 13.4 12/13/2016   HCT 40.5 12/13/2016   MCV 80.4 12/13/2016   PLT 319 12/13/2016    Patient Active Problem List   Diagnosis Date Noted  . Pre-eclampsia superimposed on chronic hypertension, antepartum 12/13/2016  . Breech presentation 12/13/2016  . Prior pregnancy with fetal demise and current pregnancy 11/16/2016  . Abnormal genetic test in pregnancy 09/20/2016  . GBS (group B streptococcus) UTI complicating pregnancy 09/16/2016  . early Dx GDM (early 2nd trimester) 09/16/2016  . Supervision of high-risk pregnancy 09/16/2016  . Placenta abruptio, antepartum   . Advanced maternal age in multigravida, second trimester 07/07/2016  . Hypertension complicating pregnancy 07/07/2016    Assessment / Plan: 46 y.o. G2P1000 at 5923w0d here for IOL for CHTN  Labor: Progressing well with pitocin titration  Fetal Wellbeing:  Cat 2 Pain Control:  IV Fentanyl  Anticipated MOD:  SVD   Sharyon CableRogers, [name], CNM 12/14/2016, 2:20 PM

## 2016-12-14 NOTE — Progress Notes (Signed)
Checked Patient's Glucose at 0530 it was 103 but did not flow over to lab results.

## 2016-12-14 NOTE — Progress Notes (Signed)
Vitals:   12/13/16 2311 12/14/16 0013  BP: (!) 166/72 (!) 149/70  Pulse: 92 82  Resp: 16 16  Temp:     On MgSO4 at 2gm/hr.  Foley fell out , cx 4/thick.  Will start pitocin and the glucostabilizer.  FHR Cat 1, occ ctx

## 2016-12-14 NOTE — Progress Notes (Signed)
Labor Progress Note [name] is a 46 y.o. G2P1000 at 4579w0d presented for IOL for CHTN possible preeclampsia  S: Patient comfortable during contractions  O:  BP 136/86   Pulse 80   Temp 97.6 F (36.4 C) (Oral)   Resp 18   Ht 5\' 6"  (1.676 m)   Wt 225 lb (102.1 kg)   LMP 04/10/2016   BMI 36.32 kg/m  EFM: 135/moderate/+accel no decel   CVE: Dilation: 6 Effacement (%): 70 Cervical Position: Posterior Station: -3 Exam by:: Middleton   CBG (last 3)  Recent Labs    12/14/16 0126 12/14/16 0530 12/14/16 0904  GLUCAP 85 103* 111*     A&P: 46 y.o. G2P1000 479w0d for IOL for CHTN vs Preeclampsia  #Labor: Progressing well on pitocin  #Pain: IV fentanyl plans epidural  #FWB: Cat 1 #GBS positive #CHTN  #GDM on insulin - not currently on glucostabilizer   Sharyon CableVeronica C Rivaan Kendall, CNM 10:46 AM

## 2016-12-15 ENCOUNTER — Other Ambulatory Visit: Payer: Self-pay

## 2016-12-15 LAB — CBC
HCT: 36.3 % (ref 36.0–46.0)
Hemoglobin: 12 g/dL (ref 12.0–15.0)
MCH: 26.7 pg (ref 26.0–34.0)
MCHC: 33.1 g/dL (ref 30.0–36.0)
MCV: 80.7 fL (ref 78.0–100.0)
PLATELETS: 321 10*3/uL (ref 150–400)
RBC: 4.5 MIL/uL (ref 3.87–5.11)
RDW: 13.8 % (ref 11.5–15.5)
WBC: 14 10*3/uL — ABNORMAL HIGH (ref 4.0–10.5)

## 2016-12-15 LAB — GLUCOSE, CAPILLARY: GLUCOSE-CAPILLARY: 104 mg/dL — AB (ref 65–99)

## 2016-12-15 MED ORDER — LACTATED RINGERS IV SOLN
INTRAVENOUS | Status: DC
  Administered 2016-12-15: 12:00:00 via INTRAVENOUS

## 2016-12-15 NOTE — Progress Notes (Signed)
Post Partum Day #1 Subjective: no complaints, up ad lib and tolerating PO; breast and bottlefeeding; unsure re contraception; baby in room; on MgSO4 and taking Norvasc  Objective: Blood pressure 127/75, pulse 90, temperature 98.5 F (36.9 C), temperature source Oral, resp. rate 18, height 5\' 6"  (1.676 m), weight 97.6 kg (215 lb 4 oz), last menstrual period 04/10/2016, SpO2 100 %, unknown if currently breastfeeding.  FBS: 104  Physical Exam:  General: alert, cooperative and no distress Lochia: appropriate Uterine Fundus: firm DVT Evaluation: No evidence of DVT seen on physical exam.  Recent Labs    12/13/16 1329 12/15/16 0520  HGB 13.4 12.0  HCT 40.5 36.3    Assessment/Plan: Plan for discharge tomorrow. Will continue on MgSO4 until 1800. BPs stable on Norvasc.   LOS: 2 days   Cam HaiSHAW, Demitra Danley CNM 12/15/2016, 7:46 AM

## 2016-12-15 NOTE — Lactation Note (Signed)
This note was copied from a baby's chart. Lactation Consultation Note; Initial visit with mom. Dad interpreted for me.Baby now 4814 hours old and has one attempt at breastfeeding just after delivery and several bottles of formula. Encouraged mom to always breast feed first to promote a good milk supply and let baby learn breast feeding. Reports she breast and formula fed her first baby. Reviewed hand expression with parents. Reviewed normal behavior the first 24 hours and cluster feeding. Encouraged to feed whenever they see feeding cues. BF brochure given. Reviewed our phone number, BFSG and OP appointments as resources for support after DC. No questions at present. Baby had formula about 1 hour ago and is asleep at present. Encouraged to call RN or LC for assist with latch. Patient Name: Desiree [name] ZOXWR'UToday's Date: 12/15/2016 Reason for consult: Initial assessment   Maternal Data Formula Feeding for Exclusion: Yes Reason for exclusion: Mother's choice to formula and breast feed on admission Has patient been taught Hand Expression?: Yes Does the patient have breastfeeding experience prior to this delivery?: Yes  Feeding Feeding Type: Bottle Fed - Formula Nipple Type: Slow - flow  LATCH Score                   Interventions Interventions: Breast feeding basics reviewed;Hand express  Lactation Tools Discussed/Used     Consult Status Consult Status: Follow-up Date: 12/16/16 Follow-up type: In-patient    [name], [name] D 12/15/2016, 8:10 AM

## 2016-12-16 MED ORDER — AMLODIPINE BESYLATE 10 MG PO TABS: 10.0000 mg | ORAL_TABLET | Freq: Every day | ORAL | 1 refills | Status: DC

## 2016-12-16 NOTE — Progress Notes (Signed)
Discharge instructions given to pt and family member present in the room. Educated on post-delivery care and when to call the doctor. Baby safety tips given. Pt has no further questions or concerns at this time. IV taken out and pt tolerated well. Pt and family walking down in stable condition.

## 2016-12-16 NOTE — Discharge Summary (Signed)
OB Discharge Summary     Patient Name: Desiree PeachRakia [name] DOB: 06/09/1970 MRN: [id number]  Date of admission: 12/13/2016 Delivering MD: Adam PhenixARNOLD, JAMES G   Date of discharge: 12/16/2016  Admitting diagnosis: 37WKS, ELEVATED BP Intrauterine pregnancy: 4878w0d     Secondary diagnosis:  Active Problems:   Pre-eclampsia superimposed on chronic hypertension, antepartum   Breech presentation  Additional problems: proteinuria     Discharge diagnosis: Term Pregnancy Delivered, CHTN with superimposed preeclampsia and breech with external version.                                                                                                Post partum procedures:none  Augmentation: Pitocin  Complications: None  Hospital course:  Induction of Labor With Vaginal Delivery   46 y.o. yo G2P2001 at 6478w0d was admitted to the hospital 12/13/2016 for induction of labor.  Indication for induction: Gestational hypertension and A2 DM.  Patient had an uncomplicated labor course as follows: Patient was admitted  Sn induced after external cephalic version which Safely converted the persistent breech to vertex Membrane Rupture Time/Date: 3:35 PM ,12/14/2016   Intrapartum Procedures: Episiotomy: None [1]                                         Lacerations:  1st degree [2]  Patient had delivery of a Viable infant.  Information for the patient's newborn:  Auburn BilberryMoussa, Boy Cheyane [[id number]][[id number]]  Delivery Method: Vag-Spont   12/14/2016  Details of delivery can be found in separate delivery note.  Patient had a routine postpartum course. Patient is discharged home 12/16/16.  Physical exam  Vitals:   12/15/16 2003 12/16/16 0038 12/16/16 0424 12/16/16 0500  BP: (!) 146/73 132/71 (!) 143/79   Pulse: 97 91 88   Resp: 19 20 19    Temp: 98.7 F (37.1 C) 99 F (37.2 C) 98.6 F (37 C)   TempSrc:  Oral Oral   SpO2: 98% 100% 100%   Weight:    214 lb 12 oz (97.4 kg)  Height:       General: alert, cooperative and no  distress Lochia: appropriate Uterine Fundus: firm Incision:  DVT Evaluation: No evidence of DVT seen on physical exam. Labs: Lab Results  Component Value Date   WBC 14.0 (H) 12/15/2016   HGB 12.0 12/15/2016   HCT 36.3 12/15/2016   MCV 80.7 12/15/2016   PLT 321 12/15/2016   CMP Latest Ref Rng & Units 12/13/2016  Glucose 65 - 99 mg/dL 272(Z118(H)  BUN 6 - 20 mg/dL 11  Creatinine 3.660.44 - 4.401.00 mg/dL 3.470.44  Sodium 425135 - 956145 mmol/L 131(L)  Potassium 3.5 - 5.1 mmol/L 4.7  Chloride 101 - 111 mmol/L 105  CO2 22 - 32 mmol/L 18(L)  Calcium 8.9 - 10.3 mg/dL 9.9  Total Protein 6.5 - 8.1 g/dL 6.4(L)  Total Bilirubin 0.3 - 1.2 mg/dL 0.3  Alkaline Phos 38 - 126 U/L 103  AST 15 - 41 U/L 20  ALT 14 - 54 U/L  16    Discharge instruction: per After Visit Summary and "Baby and Me Booklet".  After visit meds:  Norvasc 10 daily  Diet: routine diet  Activity: Advance as tolerated. Pelvic rest for 6 weeks.   Outpatient follow up:6 weeks Follow up Appt: Future Appointments  Date Time Provider Department Center  12/20/2016  7:40 AM [facility] NST [facility] WOC  12/20/2016  8:40 AM Constant, Gigi GinPeggy, MD [facility]OC-[facility] WOC  12/20/2016  9:45 AM WH-MFC US 2 WH-MFCUS MFC-US   Follow up Visit:No Follow-up on file. baby love this week   Postpartum contraception: Nexplanon  Newborn Data: Live born female  Birth Weight: 6 lb 2.8 oz (2800 g) APGAR: 3, 7  Newborn Delivery   Birth date/time:  12/14/2016 17:43:00 Delivery type:  Vaginal, Vacuum (Extractor)     Baby Feeding: Bottle and Breast Disposition:home with mother   12/16/2016 Tilda BurrowJohn V Ryeleigh Santore, MD

## 2016-12-16 NOTE — Discharge Instructions (Signed)
How to Take Your Blood Pressure  Blood pressure is a measurement of how strongly your blood is pressing against the walls of your arteries. Arteries are blood vessels that carry blood from your heart throughout your body. Your health care provider takes your blood pressure at each office visit. You can also take your own blood pressure at home with a blood pressure machine. You may need to take your own blood pressure:   To confirm a diagnosis of high blood pressure (hypertension).   To monitor your blood pressure over time.   To make sure your blood pressure medicine is working.    Supplies needed:  To take your blood pressure, you will need a blood pressure machine. You can buy a blood pressure machine, or blood pressure monitor, at most drugstores or online. There are several types of home blood pressure monitors. When choosing one, consider the following:   Choose a monitor that has an arm cuff.   Choose a monitor that wraps snugly around your upper arm. You should be able to fit only one finger between your arm and the cuff.   Do not choose a monitor that measures your blood pressure from your wrist or finger.    Your health care provider can suggest a reliable monitor that will meet your needs.  How to prepare  To get the most accurate reading, avoid the following for 30 minutes before you check your blood pressure:   Drinking caffeine.   Drinking alcohol.   Eating.   Smoking.   Exercising.    Five minutes before you check your blood pressure:   Empty your bladder.   Sit quietly without talking in a dining chair, rather than in a soft couch or armchair.    How to take your blood pressure  To check your blood pressure, follow the instructions in the manual that came with your blood pressure monitor. If you have a digital blood pressure monitor, the instructions may be as follows:  1. Sit up straight.  2. Place your feet on the floor. Do not cross your ankles or legs.  3. Rest your left arm at the  level of your heart on a table or desk or on the arm of a chair.  4. Pull up your shirt sleeve.  5. Wrap the blood pressure cuff around the upper part of your left arm, 1 inch (2.5 cm) above your elbow. It is best to wrap the cuff around bare skin.  6. Fit the cuff snugly around your arm. You should be able to place only one finger between the cuff and your arm.  7. Position the cord inside the groove of your elbow.  8. Press the power button.  9. Sit quietly while the cuff inflates and deflates.  10. Read the digital reading on the monitor screen and write it down (record it).  11. Wait 2-3 minutes, then repeat the steps, starting at step 1.    What does my blood pressure reading mean?  A blood pressure reading consists of a higher number over a lower number. Ideally, your blood pressure should be below 120/80. The first ("top") number is called the systolic pressure. It is a measure of the pressure in your arteries as your heart beats. The second ("bottom") number is called the diastolic pressure. It is a measure of the pressure in your arteries as the heart relaxes.  Blood pressure is classified into four stages. The following are the stages for adults who do   not have a short-term serious illness or a chronic condition. Systolic pressure and diastolic pressure are measured in a unit called mm Hg.  Normal   Systolic pressure: below 120.   Diastolic pressure: below 80.  Elevated   Systolic pressure: 120-129.   Diastolic pressure: below 80.  Hypertension stage 1   Systolic pressure: 130-139.   Diastolic pressure: 80-89.  Hypertension stage 2   Systolic pressure: 140 or above.   Diastolic pressure: 90 or above.  You can have prehypertension or hypertension even if only the systolic or only the diastolic number in your reading is higher than normal.  Follow these instructions at home:   Check your blood pressure as often as recommended by your health care provider.   Take your monitor to the next appointment  with your health care provider to make sure:  ? That you are using it correctly.  ? That it provides accurate readings.   Be sure you understand what your goal blood pressure numbers are.   Tell your health care provider if you are having any side effects from blood pressure medicine.  Contact a health care provider if:   Your blood pressure is consistently high.  Get help right away if:   Your systolic blood pressure is higher than 180.   Your diastolic blood pressure is higher than 110.  This information is not intended to replace advice given to you by your health care provider. Make sure you discuss any questions you have with your health care provider.  Document Released: 06/03/2015 Document Revised: 08/16/2015 Document Reviewed: 06/03/2015  Elsevier Interactive Patient Education  2018 [employer]vier Inc[employer]

## 2016-12-20 ENCOUNTER — Other Ambulatory Visit: Payer: Self-pay

## 2016-12-20 ENCOUNTER — Ambulatory Visit (HOSPITAL_COMMUNITY): Payer: MEDICAID

## 2016-12-20 ENCOUNTER — Encounter: Payer: Self-pay | Admitting: Obstetrics and Gynecology

## 2017-01-21 ENCOUNTER — Ambulatory Visit (INDEPENDENT_AMBULATORY_CARE_PROVIDER_SITE_OTHER): Payer: Self-pay | Admitting: Obstetrics and Gynecology

## 2017-01-21 ENCOUNTER — Encounter: Payer: Self-pay | Admitting: Obstetrics and Gynecology

## 2017-01-21 NOTE — Progress Notes (Signed)
[id number]

## 2017-01-21 NOTE — Progress Notes (Signed)
Obstetrics/Postpartum Visit  Appointment Date: 01/21/2017  OBGYN Clinic: [facility]  Primary Care Provider: Patient, No Pcp Per  Chief Complaint:  Chief Complaint  Patient presents with  . Postpartum Care    History of Present Illness: [name] is a 47 y.o. African G2P2001 (Patient's last menstrual period was 04/10/2016.), seen for the above chief complaint. 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 She is taking her norvasc.  She is s/p VAVD on 12/14/16 after successful ECV on admission, induced for chronic HTN with superimposed pre-eclampsia with severe features; she was discharged to home on PPD#2  Vaginal bleeding or discharge: occasional spotting, no period yet Breast or formula feeding: is breastfeeding Intercourse: No  Contraception after delivery: declines PP depression s/s: No  Any bowel or bladder issues: some constipation Pap smear: no abnormalities (date: 08/2016)  Review of Systems: Positive for nothing.   Her 12 point review of systems is negative or as noted in the History of Present Illness.  Patient Active Problem List   Diagnosis Date Noted  . Pre-eclampsia superimposed on chronic hypertension, antepartum 12/13/2016  . Breech presentation 12/13/2016  . Prior pregnancy with fetal demise and current pregnancy 11/16/2016  . Abnormal genetic test in pregnancy 09/20/2016  . GBS (group B streptococcus) UTI complicating pregnancy 09/16/2016  . early Dx GDM (early 2nd trimester) 09/16/2016  . Supervision of high-risk pregnancy 09/16/2016  . Placenta abruptio, antepartum   . Advanced maternal age in multigravida, second trimester 07/07/2016  . Hypertension complicating pregnancy 07/07/2016    Medications Raygan Skarda had no medications administered during this visit. Current Outpatient Medications  Medication Sig Dispense Refill  . ACCU-CHEK FASTCLIX LANCETS MISC 1 Units by Percutaneous route 4 (four) times daily. 100 each 12  .  amLODipine (NORVASC) 10 MG tablet Take 1 tablet (10 mg total) by mouth daily. 30 tablet 1  . docusate sodium (COLACE) 100 MG capsule Take 1 capsule (100 mg total) by mouth 2 (two) times daily as needed for mild constipation. 60 capsule 3  . Prenatal Vit-Fe Fumarate-FA (MULTIVITAMIN-PRENATAL) 27-0.8 MG TABS tablet Take 1 tablet by mouth daily at 12 noon.     No current facility-administered medications for this visit.     Allergies Patient has no known allergies.  Physical Exam:  BP (!) 154/93   Pulse 76   Wt 205 lb (93 kg)   LMP 04/10/2016   Breastfeeding? Yes   BMI 33.09 kg/m  Body mass index is 33.09 kg/m. General appearance: Well nourished, well developed female in no acute distress.  Cardiovascular: normal s1 and s2.  No murmurs, rubs or gallops. Respiratory:  Clear to auscultation bilateral. Normal respiratory effort Abdomen: no masses, hernias; diffusely non tender to palpation, non distended Breasts: patient declines exam Neuro/Psych:  Normal mood and affect.  Skin:  Warm and dry.  Lymphatic:  No inguinal lymphadenopathy.   Pelvic exam: deferred  Laboratory: 2 hr GTT today  PP Depression Screening: negative  Assessment: Patient is a 47 y.o. G2P2001 who is 6 weeks post partum from a VAVD s/p ECV with induction of labor for chronic HTN with superimposed pre-eclampsia with severe features. 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 She is doing well, declines contraception today. 2 hr GTT.  Plan:    Orders Placed This Encounter  Procedures  . Glucose tolerance, 2 hours  - to f/u with PCP for [facility] management, reviewed importance of continuing meds until she sees PCP - gave info for mammogram scholarship  RTC as needed   K. Therese SarahMeryl Ranetta Armacost, M.D. Attending Obstetrician & Gynecologist, [facility] for [facility], [facility]

## 2017-01-21 NOTE — Progress Notes (Signed)
error 

## 2017-01-22 LAB — GLUCOSE TOLERANCE, 2 HOURS
GLUCOSE FASTING GTT: 143 mg/dL — AB (ref 65–99)
GLUCOSE, 2 HOUR: 317 mg/dL — AB (ref 65–139)

## 2017-01-23 ENCOUNTER — Other Ambulatory Visit: Payer: Self-pay | Admitting: Obstetrics & Gynecology

## 2017-01-23 DIAGNOSIS — Z1231 Encounter for screening mammogram for malignant neoplasm of breast: Secondary | ICD-10-CM

## 2017-01-31 ENCOUNTER — Telehealth: Payer: Self-pay | Admitting: General Practice

## 2017-01-31 DIAGNOSIS — E119 Type 2 diabetes mellitus without complications: Secondary | ICD-10-CM

## 2017-01-31 NOTE — Telephone Encounter (Signed)
-----   Message from Conan BowensKelly M Davis, MD sent at 01/23/2017  2:52 PM EST ----- Please notify patient of positive result for GTT at post partum visit. She needs to follow up with primary care physician for management of diabetes.

## 2017-01-31 NOTE — Telephone Encounter (Signed)
[facility] no interpreter is available in [place] language. Called and informed patient of results & need to follow up with a primary care doctor & appt. Patient verbalized understanding & said to call and pass the information along to her sister as well for her. Patient had no questions. Called patient's sister and gave her the appt information. She verbalized understanding & had no questions

## 2017-02-05 ENCOUNTER — Ambulatory Visit: Payer: Self-pay | Admitting: Nurse Practitioner

## 2017-02-08 ENCOUNTER — Inpatient Hospital Stay (HOSPITAL_COMMUNITY)
Admission: AD | Admit: 2017-02-08 | Discharge: 2017-02-08 | Disposition: A | Discharge status: Home / Self Care | Payer: Self-pay | Source: Ambulatory Visit | Attending: Obstetrics & Gynecology | Admitting: Obstetrics & Gynecology

## 2017-02-08 ENCOUNTER — Encounter (HOSPITAL_COMMUNITY): Payer: Self-pay | Admitting: *Deleted

## 2017-02-08 DIAGNOSIS — N3001 Acute cystitis with hematuria: Secondary | ICD-10-CM | POA: Insufficient documentation

## 2017-02-08 DIAGNOSIS — R1031 Right lower quadrant pain: Secondary | ICD-10-CM | POA: Insufficient documentation

## 2017-02-08 DIAGNOSIS — R300 Dysuria: Secondary | ICD-10-CM

## 2017-02-08 LAB — URINALYSIS, ROUTINE W REFLEX MICROSCOPIC
BILIRUBIN URINE: NEGATIVE
Glucose, UA: NEGATIVE mg/dL
KETONES UR: NEGATIVE mg/dL
Nitrite: NEGATIVE
PH: 6 (ref 5.0–8.0)
Protein, ur: 100 mg/dL — AB
SPECIFIC GRAVITY, URINE: 1.014 (ref 1.005–1.030)

## 2017-02-08 LAB — CBC WITH DIFFERENTIAL/PLATELET
BASOS PCT: 0 %
Basophils Absolute: 0 10*3/uL (ref 0.0–0.1)
EOS ABS: 0.3 10*3/uL (ref 0.0–0.7)
Eosinophils Relative: 4 %
HCT: 36.4 % (ref 36.0–46.0)
HEMOGLOBIN: 11.9 g/dL — AB (ref 12.0–15.0)
Lymphocytes Relative: 32 %
Lymphs Abs: 2.8 10*3/uL (ref 0.7–4.0)
MCH: 25.3 pg — ABNORMAL LOW (ref 26.0–34.0)
MCHC: 32.7 g/dL (ref 30.0–36.0)
MCV: 77.3 fL — ABNORMAL LOW (ref 78.0–100.0)
MONOS PCT: 3 %
Monocytes Absolute: 0.3 10*3/uL (ref 0.1–1.0)
NEUTROS ABS: 5.4 10*3/uL (ref 1.7–7.7)
NEUTROS PCT: 61 %
Platelets: 356 10*3/uL (ref 150–400)
RBC: 4.71 MIL/uL (ref 3.87–5.11)
RDW: 13 % (ref 11.5–15.5)
WBC: 8.9 10*3/uL (ref 4.0–10.5)

## 2017-02-08 MED ORDER — SULFAMETHOXAZOLE-TRIMETHOPRIM 800-160 MG PO TABS
1.0000 | ORAL_TABLET | Freq: Once | ORAL | Status: AC
  Administered 2017-02-08: 1 via ORAL
  Filled 2017-02-08: qty 1

## 2017-02-08 MED ORDER — PHENAZOPYRIDINE HCL 100 MG PO TABS
200.0000 mg | ORAL_TABLET | Freq: Once | ORAL | Status: AC
  Administered 2017-02-08: 200 mg via ORAL
  Filled 2017-02-08: qty 2

## 2017-02-08 MED ORDER — PHENAZOPYRIDINE HCL 200 MG PO TABS: 200.0000 mg | ORAL_TABLET | Freq: Three times a day (TID) | ORAL | 1 refills | Status: DC | PRN

## 2017-02-08 MED ORDER — SULFAMETHOXAZOLE-TRIMETHOPRIM 800-160 MG PO TABS: 1.0000 | ORAL_TABLET | Freq: Two times a day (BID) | ORAL | 1 refills | Status: DC

## 2017-02-08 NOTE — MAU Provider Note (Signed)
Chief Complaint:  Dysuria (pt states she can not urinate)   Provider saw patient at 0530 hrs    HPI: [name] is a 47 y.o. G2P2001 who presents to maternity admissions reporting pain on right side of abdomen and burning when she urinates.  Husband translates for patient per request.  Started a few days ago.  No fever. No chills. No hematuria noted by patient. . She reports no vaginal itching/burning, urinary symptoms, h/a, dizziness, n/v, or fever/chills.   She is about 8 weeks postpartum  Dysuria   This is a new problem. The current episode started in the past 7 days. The problem occurs every urination. The problem has been unchanged. The quality of the pain is described as burning and aching. There has been no fever. There is no history of pyelonephritis. Associated symptoms include frequency. Pertinent negatives include no chills, discharge, hematuria, nausea or possible pregnancy. She has tried nothing for the symptoms.    RN note: Pt presents to MAU c/o right sided abdominal pain that started 3days ago. Pt also reports pain while urinating. Pt states she may have had a fever before her shower.     Past Medical History: Past Medical History:  Diagnosis Date  . Gestational diabetes   . Hypertension     Past obstetric history: OB History  Gravida Para Term Preterm AB Living  2 2 2     1   SAB TAB Ectopic Multiple Live Births          2    # Outcome Date GA Lbr Len/2nd Weight Sex Delivery Anes PTL Lv  2 Term 12/14/16 [redacted]w[redacted]d 02:32 / 00:11 6 lb 2.8 oz (2.8 kg) M Vag-Vacuum None  LIV  1 Term 08/11/97 [redacted]w[redacted]d  7 lb (3.175 kg) F Vag-Spont   DEC     Complications: Malaria      Past Surgical History: Past Surgical History:  Procedure Laterality Date  . DILATION AND CURETTAGE OF UTERUS    . HYSTEROSCOPY WITH RESECTOSCOPE  2011    Family History: Family History  Problem Relation Age of Onset  . Alcohol abuse Neg Hx     Social History: Social History   Tobacco Use  .  Smoking status: Never Smoker  . Smokeless tobacco: Never Used  Substance Use Topics  . Alcohol use: No  . Drug use: No    Allergies: No Known Allergies  Meds:  Medications Prior to Admission  Medication Sig Dispense Refill Last Dose  . acetaminophen (TYLENOL) 325 MG tablet Take 650 mg by mouth every 6 (six) hours as needed.   02/08/2017 at Unknown time  . ACCU-CHEK FASTCLIX LANCETS MISC 1 Units by Percutaneous route 4 (four) times daily. 100 each 12 Unknown at Unknown time  . amLODipine (NORVASC) 10 MG tablet Take 1 tablet (10 mg total) by mouth daily. 30 tablet 1 Unknown at Unknown time  . docusate sodium (COLACE) 100 MG capsule Take 1 capsule (100 mg total) by mouth 2 (two) times daily as needed for mild constipation. 60 capsule 3 Unknown at Unknown time  . Prenatal Vit-Fe Fumarate-FA (MULTIVITAMIN-PRENATAL) 27-0.8 MG TABS tablet Take 1 tablet by mouth daily at 12 noon.   Unknown at Unknown time    I have reviewed patient's Past Medical Hx, Surgical Hx, Family Hx, Social Hx, medications and allergies.  ROS:  Review of Systems  Constitutional: Negative for chills.  Gastrointestinal: Negative for nausea.  Genitourinary: Positive for dysuria and frequency. Negative for hematuria.   Other systems  negative     Physical Exam   Patient Vitals for the past 24 hrs:  BP Temp Temp src Pulse Resp Height Weight  02/08/17 0518 (!) 152/82 99 F (37.2 C) Oral 93 20 - -  02/08/17 0510 - - - - - 5\' 6"  (1.676 m) 203 lb 1.3 oz (92.1 kg)   Constitutional: Well-developed, well-nourished female in no acute distress.  Cardiovascular: normal rate and rhythm, no ectopy audible, S1 & S2 heard, no murmur Respiratory: normal effort, no distress. Lungs CTAB with no wheezes or crackles GI: Abd soft, non-tender.  Nondistended.  No rebound, No guarding.  Bowel Sounds audible  MS: Extremities nontender, no edema, normal ROM Neurologic: Alert and oriented x 4.   Grossly nonfocal. GU: Neg CVAT. Skin:  Warm  and Dry Psych:  Affect appropriate.  PELVIC EXAM: deferred   Labs: Results for orders placed or performed during the hospital encounter of 02/08/17 (from the past 24 hour(s))  Urinalysis, Routine w reflex microscopic     Status: Abnormal   Collection Time: 02/08/17  5:07 AM  Result Value Ref Range   Color, Urine YELLOW YELLOW   APPearance HAZY (A) CLEAR   Specific Gravity, Urine 1.014 1.005 - 1.030   pH 6.0 5.0 - 8.0   Glucose, UA NEGATIVE NEGATIVE mg/dL   Hgb urine dipstick SMALL (A) NEGATIVE   Bilirubin Urine NEGATIVE NEGATIVE   Ketones, ur NEGATIVE NEGATIVE mg/dL   Protein, ur 161100 (A) NEGATIVE mg/dL   Nitrite NEGATIVE NEGATIVE   Leukocytes, UA MODERATE (A) NEGATIVE   RBC / HPF 6-30 0 - 5 RBC/hpf   WBC, UA TOO NUMEROUS TO COUNT 0 - 5 WBC/hpf   Bacteria, UA RARE (A) NONE SEEN   Squamous Epithelial / LPF 0-5 (A) NONE SEEN   Mucus PRESENT   CBC with Differential/Platelet     Status: Abnormal   Collection Time: 02/08/17  5:45 AM  Result Value Ref Range   WBC 8.9 4.0 - 10.5 K/uL   RBC 4.71 3.87 - 5.11 MIL/uL   Hemoglobin 11.9 (L) 12.0 - 15.0 g/dL   HCT 09.636.4 04.536.0 - 40.946.0 %   MCV 77.3 (L) 78.0 - 100.0 fL   MCH 25.3 (L) 26.0 - 34.0 pg   MCHC 32.7 30.0 - 36.0 g/dL   RDW 81.113.0 91.411.5 - 78.215.5 %   Platelets 356 150 - 400 K/uL   Neutrophils Relative % 61 %   Neutro Abs 5.4 1.7 - 7.7 K/uL   Lymphocytes Relative 32 %   Lymphs Abs 2.8 0.7 - 4.0 K/uL   Monocytes Relative 3 %   Monocytes Absolute 0.3 0.1 - 1.0 K/uL   Eosinophils Relative 4 %   Eosinophils Absolute 0.3 0.0 - 0.7 K/uL   Basophils Relative 0 %   Basophils Absolute 0.0 0.0 - 0.1 K/uL    --/--/O POS (12/06 1329)  Imaging:  No results found.  MAU Course/MDM: I have ordered labs as follows:see above Urine is suggestive of UTI CBC done to assess for leukocytosis, this is WNL Imaging ordered: none Results reviewed.    Treatments in MAU included First dose of Bactrim and Pyridium given Explained how to take meds and  to finish them If she worsens or does not improve after 48 hours, seek further care.   Pt stable at time of discharge.  Assessment: Dysuria - Plan: Discharge patient  Right lower quadrant abdominal pain - Plan: Discharge patient  Acute cystitis with hematuria - Plan: Discharge patient   Plan:  Discharge home Recommend Push fluids Rx sent for Bactrim DS for UTI/possible pyelonephritis Rx Pyridium for dysuria  Encouraged to return here or to other Urgent Care/ED if she develops worsening of symptoms, increase in pain, fever, or other concerning symptoms.   Wynelle Bourgeois CNM, MSN Certified Nurse-Midwife 02/08/2017 5:32 AM

## 2017-02-08 NOTE — Discharge Instructions (Signed)

## 2017-02-08 NOTE — MAU Note (Signed)
Pt presents to MAU c/o right sided abdominal pain that started 3days ago. Pt also reports pain while urinating. Pt states she may have had a fever before her shower.

## 2017-02-09 LAB — URINE CULTURE: Culture: NO GROWTH

## 2017-02-18 ENCOUNTER — Encounter: Payer: Self-pay | Admitting: Nurse Practitioner

## 2017-02-18 ENCOUNTER — Ambulatory Visit: Payer: Self-pay | Attending: Nurse Practitioner | Admitting: Nurse Practitioner

## 2017-02-18 ENCOUNTER — Other Ambulatory Visit: Payer: Self-pay | Admitting: Nurse Practitioner

## 2017-02-18 VITALS — BP 150/110 | HR 72 | Temp 98.3°F | Ht 66.0 in | Wt 206.2 lb

## 2017-02-18 DIAGNOSIS — K5900 Constipation, unspecified: Secondary | ICD-10-CM | POA: Insufficient documentation

## 2017-02-18 DIAGNOSIS — I10 Essential (primary) hypertension: Secondary | ICD-10-CM

## 2017-02-18 DIAGNOSIS — Z8632 Personal history of gestational diabetes: Secondary | ICD-10-CM | POA: Insufficient documentation

## 2017-02-18 DIAGNOSIS — Z79899 Other long term (current) drug therapy: Secondary | ICD-10-CM | POA: Insufficient documentation

## 2017-02-18 LAB — GLUCOSE, POCT (MANUAL RESULT ENTRY): POC Glucose: 151 mg/dl — AB (ref 70–99)

## 2017-02-18 MED ORDER — AMLODIPINE BESYLATE 10 MG PO TABS: 10.0000 mg | ORAL_TABLET | Freq: Every day | ORAL | 1 refills | Status: DC

## 2017-02-18 MED FILL — ?AMLODIPINE BESYLATE 10MG T: 10 | 30 days supply | Qty: 30 | Fill #0

## 2017-02-18 NOTE — Progress Notes (Signed)
po

## 2017-02-18 NOTE — Patient Instructions (Addendum)
Amlodipine tablets What is this medicine? AMLODIPINE (am LOE di peen) is a calcium-channel blocker. It affects the amount of calcium found in your heart and muscle cells. This relaxes your blood vessels, which can reduce the amount of work the heart has to do. This medicine is used to lower high blood pressure. It is also used to prevent chest pain. This medicine may be used for other purposes; ask your health care provider or pharmacist if you have questions. COMMON BRAND NAME(S): Norvasc What should I tell my health care provider before I take this medicine? They need to know if you have any of these conditions: -heart problems like heart failure or aortic stenosis -liver disease -an unusual or allergic reaction to amlodipine, other medicines, foods, dyes, or preservatives -pregnant or trying to get pregnant -breast-feeding How should I use this medicine? Take this medicine by mouth with a glass of water. Follow the directions on the prescription label. Take your medicine at regular intervals. Do not take more medicine than directed. Talk to your pediatrician regarding the use of this medicine in children. Special care may be needed. This medicine has been used in children as young as 6. Persons over 26 years old may have a stronger reaction to this medicine and need smaller doses. Overdosage: If you think you have taken too much of this medicine contact a poison control center or emergency room at once. NOTE: This medicine is only for you. Do not share this medicine with others. What if I miss a dose? If you miss a dose, take it as soon as you can. If it is almost time for your next dose, take only that dose. Do not take double or extra doses. What may interact with this medicine? -herbal or dietary supplements -local or general anesthetics -medicines for high blood pressure -medicines for prostate problems -rifampin This list may not describe all possible interactions. Give your health  care provider a list of all the medicines, herbs, non-prescription drugs, or dietary supplements you use. Also tell them if you smoke, drink alcohol, or use illegal drugs. Some items may interact with your medicine. What should I watch for while using this medicine? Visit your doctor or health care professional for regular check ups. Check your blood pressure and pulse rate regularly. Ask your health care professional what your blood pressure and pulse rate should be, and when you should contact him or her. This medicine may make you feel confused, dizzy or lightheaded. Do not drive, use machinery, or do anything that needs mental alertness until you know how this medicine affects you. To reduce the risk of dizzy or fainting spells, do not sit or stand up quickly, especially if you are an older patient. Avoid alcoholic drinks; they can make you more dizzy. Do not suddenly stop taking amlodipine. Ask your doctor or health care professional how you can gradually reduce the dose. What side effects may I notice from receiving this medicine? Side effects that you should report to your doctor or health care professional as soon as possible: -allergic reactions like skin rash, itching or hives, swelling of the face, lips, or tongue -breathing problems -changes in vision or hearing -chest pain -fast, irregular heartbeat -swelling of legs or ankles Side effects that usually do not require medical attention (report to your doctor or health care professional if they continue or are bothersome): -dry mouth -facial flushing -nausea, vomiting -stomach gas, pain -tired, weak -trouble sleeping This list may not describe all possible side  effects. Call your doctor for medical advice about side effects. You may report side effects to FDA at 1-800-FDA-1088. Where should I keep my medicine? Keep out of the reach of children. Store at room temperature between 59 and 86 degrees F (15 and 30 degrees C). Protect from  light. Keep container tightly closed. Throw away any unused medicine after the expiration date. NOTE: This sheet is a summary. It may not cover all possible information. If you have questions about this medicine, talk to your doctor, pharmacist, or health care provider.  2018 Elsevier/Gold Standard (2011-11-23 11:40:58)  DASH Eating Plan DASH stands for "Dietary Approaches to Stop Hypertension." The DASH eating plan is a healthy eating plan that has been shown to reduce high blood pressure (hypertension). It may also reduce your risk for type 2 diabetes, heart disease, and stroke. The DASH eating plan may also help with weight loss. What are tips for following this plan? General guidelines  Avoid eating more than 2,300 mg (milligrams) of salt (sodium) a day. If you have hypertension, you may need to reduce your sodium intake to 1,500 mg a day.  Limit alcohol intake to no more than 1 drink a day for nonpregnant women and 2 drinks a day for men. One drink equals 12 oz of beer, 5 oz of wine, or 1 oz of hard liquor.  Work with your health care provider to maintain a healthy body weight or to lose weight. Ask what an ideal weight is for you.  Get at least 30 minutes of exercise that causes your heart to beat faster (aerobic exercise) most days of the week. Activities may include walking, swimming, or biking.  Work with your health care provider or diet and nutrition specialist (dietitian) to adjust your eating plan to your individual calorie needs. Reading food labels  Check food labels for the amount of sodium per serving. Choose foods with less than 5 percent of the Daily Value of sodium. Generally, foods with less than 300 mg of sodium per serving fit into this eating plan.  To find whole grains, look for the word "whole" as the first word in the ingredient list. Shopping  Buy products labeled as "low-sodium" or "no salt added."  Buy fresh foods. Avoid canned foods and premade or frozen  meals. Cooking  Avoid adding salt when cooking. Use salt-free seasonings or herbs instead of table salt or sea salt. Check with your health care provider or pharmacist before using salt substitutes.  Do not fry foods. Cook foods using healthy methods such as baking, boiling, grilling, and broiling instead.  Cook with heart-healthy oils, such as olive, canola, soybean, or sunflower oil. Meal planning   Eat a balanced diet that includes: ? 5 or more servings of fruits and vegetables each day. At each meal, try to fill half of your plate with fruits and vegetables. ? Up to 6-8 servings of whole grains each day. ? Less than 6 oz of lean meat, poultry, or fish each day. A 3-oz serving of meat is about the same size as a deck of cards. One egg equals 1 oz. ? 2 servings of low-fat dairy each day. ? A serving of nuts, seeds, or beans 5 times each week. ? Heart-healthy fats. Healthy fats called Omega-3 fatty acids are found in foods such as flaxseeds and coldwater fish, like sardines, salmon, and mackerel.  Limit how much you eat of the following: ? Canned or prepackaged foods. ? Food that is high in trans fat,  such as fried foods. ? Food that is high in saturated fat, such as fatty meat. ? Sweets, desserts, sugary drinks, and other foods with added sugar. ? Full-fat dairy products.  Do not salt foods before eating.  Try to eat at least 2 vegetarian meals each week.  Eat more home-cooked food and less restaurant, buffet, and fast food.  When eating at a restaurant, ask that your food be prepared with less salt or no salt, if possible. What foods are recommended? The items listed may not be a complete list. Talk with your dietitian about what dietary choices are best for you. Grains Whole-grain or whole-wheat bread. Whole-grain or whole-wheat pasta. Brown rice. [place]. Bulgur. Whole-grain and low-sodium cereals. Pita bread. Low-fat, low-sodium crackers. Whole-wheat flour  tortillas. Vegetables Fresh or frozen vegetables (raw, steamed, roasted, or grilled). Low-sodium or reduced-sodium tomato and vegetable juice. Low-sodium or reduced-sodium tomato sauce and tomato paste. Low-sodium or reduced-sodium canned vegetables. Fruits All fresh, dried, or frozen fruit. Canned fruit in natural juice (without added sugar). Meat and other protein foods Skinless chicken or [place]. Ground chicken or [place]. Pork with fat trimmed off. Fish and seafood. Egg whites. Dried beans, peas, or lentils. Unsalted nuts, nut butters, and seeds. Unsalted canned beans. Lean cuts of beef with fat trimmed off. Low-sodium, lean deli meat. Dairy Low-fat (1%) or fat-free (skim) milk. Fat-free, low-fat, or reduced-fat cheeses. Nonfat, low-sodium ricotta or cottage cheese. Low-fat or nonfat yogurt. Low-fat, low-sodium cheese. Fats and oils Soft margarine without trans fats. Vegetable oil. Low-fat, reduced-fat, or light mayonnaise and salad dressings (reduced-sodium). Canola, safflower, olive, soybean, and sunflower oils. Avocado. Seasoning and other foods Herbs. Spices. Seasoning mixes without salt. Unsalted popcorn and pretzels. Fat-free sweets. What foods are not recommended? The items listed may not be a complete list. Talk with your dietitian about what dietary choices are best for you. Grains Baked goods made with fat, such as croissants, muffins, or some breads. Dry pasta or rice meal packs. Vegetables Creamed or fried vegetables. Vegetables in a cheese sauce. Regular canned vegetables (not low-sodium or reduced-sodium). Regular canned tomato sauce and paste (not low-sodium or reduced-sodium). Regular tomato and vegetable juice (not low-sodium or reduced-sodium). Angie Fava. Olives. Fruits Canned fruit in a light or heavy syrup. Fried fruit. Fruit in cream or butter sauce. Meat and other protein foods Fatty cuts of meat. Ribs. Fried meat. Berniece Salines. Sausage. Bologna and other processed lunch meats.  Salami. Fatback. Hotdogs. Bratwurst. Salted nuts and seeds. Canned beans with added salt. Canned or smoked fish. Whole eggs or egg yolks. Chicken or [place] with skin. Dairy Whole or 2% milk, cream, and half-and-half. Whole or full-fat cream cheese. Whole-fat or sweetened yogurt. Full-fat cheese. Nondairy creamers. Whipped toppings. Processed cheese and cheese spreads. Fats and oils Butter. Stick margarine. Lard. Shortening. Ghee. Bacon fat. Tropical oils, such as coconut, palm kernel, or palm oil. Seasoning and other foods Salted popcorn and pretzels. Onion salt, garlic salt, seasoned salt, table salt, and sea salt. Worcestershire sauce. Tartar sauce. Barbecue sauce. Teriyaki sauce. Soy sauce, including reduced-sodium. Steak sauce. Canned and packaged gravies. Fish sauce. Oyster sauce. Cocktail sauce. Horseradish that you find on the shelf. Ketchup. Mustard. Meat flavorings and tenderizers. Bouillon cubes. Hot sauce and Tabasco sauce. Premade or packaged marinades. Premade or packaged taco seasonings. Relishes. Regular salad dressings. Where to find more information:  National Heart, Lung, and [place]: https://wilson-eaton.com/  American Heart Association: www.heart.org Summary  The DASH eating plan is a healthy eating plan that has been  shown to reduce high blood pressure (hypertension). It may also reduce your risk for type 2 diabetes, heart disease, and stroke.  With the DASH eating plan, you should limit salt (sodium) intake to 2,300 mg a day. If you have hypertension, you may need to reduce your sodium intake to 1,500 mg a day.  When on the DASH eating plan, aim to eat more fresh fruits and vegetables, whole grains, lean proteins, low-fat dairy, and heart-healthy fats.  Work with your health care provider or diet and nutrition specialist (dietitian) to adjust your eating plan to your individual calorie needs. This information is not intended to replace advice given to you by your health  care provider. Make sure you discuss any questions you have with your health care provider. Document Released: 12/14/2010 Document Revised: 12/19/2015 Document Reviewed: 12/19/2015 Elsevier Interactive Patient Education  2018 [employer] Amlodipine tablets What is this medicine? AMLODIPINE (am LOE di peen) is a calcium-channel blocker. It affects the amount of calcium found in your heart and muscle cells. This relaxes your blood vessels, which can reduce the amount of work the heart has to do. This medicine is used to lower high blood pressure. It is also used to prevent chest pain. This medicine may be used for other purposes; ask your health care provider or pharmacist if you have questions. COMMON BRAND NAME(S): Norvasc What should I tell my health care provider before I take this medicine? They need to know if you have any of these conditions: -heart problems like heart failure or aortic stenosis -liver disease -an unusual or allergic reaction to amlodipine, other medicines, foods, dyes, or preservatives -pregnant or trying to get pregnant -breast-feeding How should I use this medicine? Take this medicine by mouth with a glass of water. Follow the directions on the prescription label. Take your medicine at regular intervals. Do not take more medicine than directed. Talk to your pediatrician regarding the use of this medicine in children. Special care may be needed. This medicine has been used in children as young as 6. Persons over 45 years old may have a stronger reaction to this medicine and need smaller doses. Overdosage: If you think you have taken too much of this medicine contact a poison control center or emergency room at once. NOTE: This medicine is only for you. Do not share this medicine with others. What if I miss a dose? If you miss a dose, take it as soon as you can. If it is almost time for your next dose, take only that dose. Do not take double or extra doses. What may  interact with this medicine? -herbal or dietary supplements -local or general anesthetics -medicines for high blood pressure -medicines for prostate problems -rifampin This list may not describe all possible interactions. Give your health care provider a list of all the medicines, herbs, non-prescription drugs, or dietary supplements you use. Also tell them if you smoke, drink alcohol, or use illegal drugs. Some items may interact with your medicine. What should I watch for while using this medicine? Visit your doctor or health care professional for regular check ups. Check your blood pressure and pulse rate regularly. Ask your health care professional what your blood pressure and pulse rate should be, and when you should contact him or her. This medicine may make you feel confused, dizzy or lightheaded. Do not drive, use machinery, or do anything that needs mental alertness until you know how this medicine affects you. To reduce the risk of dizzy  or fainting spells, do not sit or stand up quickly, especially if you are an older patient. Avoid alcoholic drinks; they can make you more dizzy. Do not suddenly stop taking amlodipine. Ask your doctor or health care professional how you can gradually reduce the dose. What side effects may I notice from receiving this medicine? Side effects that you should report to your doctor or health care professional as soon as possible: -allergic reactions like skin rash, itching or hives, swelling of the face, lips, or tongue -breathing problems -changes in vision or hearing -chest pain -fast, irregular heartbeat -swelling of legs or ankles Side effects that usually do not require medical attention (report to your doctor or health care professional if they continue or are bothersome): -dry mouth -facial flushing -nausea, vomiting -stomach gas, pain -tired, weak -trouble sleeping This list may not describe all possible side effects. Call your doctor for  medical advice about side effects. You may report side effects to FDA at 1-800-FDA-1088. Where should I keep my medicine? Keep out of the reach of children. Store at room temperature between 59 and 86 degrees F (15 and 30 degrees C). Protect from light. Keep container tightly closed. Throw away any unused medicine after the expiration date. NOTE: This sheet is a summary. It may not cover all possible information. If you have questions about this medicine, talk to your doctor, pharmacist, or health care provider.  2018 Elsevier/Gold Standard (2011-11-23 11:40:58)

## 2017-02-18 NOTE — Progress Notes (Signed)
Assessment & Plan:  [name] was seen today for gestational diabetes.  Diagnoses and all orders for this visit:  History of gestational diabetes -     Glucose (CBG) -     Hemoglobin A1c  Essential hypertension -     amLODipine (NORVASC) 10 MG tablet; Take 1 tablet (10 mg total) by mouth daily. Continue all antihypertensives as prescribed.  Remember to bring in your blood pressure log with you for your follow up appointment.  DASH/Mediterranean Diets are healthier choices for HTN.      Patient has been counseled on age-appropriate routine health concerns for screening and prevention. These are reviewed and up-to-date. Referrals have been placed accordingly. Immunizations are up-to-date or declined.    Subjective:   Chief Complaint  Patient presents with  . Gestational Diabetes    Patient is here for gestational diabetes. Patient stated she here to check if she need to be on any medications for diabetes.    HPI [name] 47 y.o. female presents to office today. She has an interpreter with her as well as her husband and their newborn.  She delivered her son on December 13, 2016. She has a history of preeclampsia with HTN complicating her pregnancy as well as gestational diabetes requiring insulin.  She was discharged on 12-16-2016. Glucose today is 151 (She did drink milk this morning). HGB A1c will be ordered today.    Essential Hypertension She stopped taking amlodipine after she ran out last month. Blood pressure is elevated today. She attributes it to lack of rest/sleep with her newborn.  I will restart her amlodipine and have her follow up in 2 weeks.  BP Readings from Last 3 Encounters:  02/18/17 (!) 150/110  02/08/17 (!) 141/83  01/21/17 (!) 154/93     Review of Systems  Constitutional: Negative for fever, malaise/fatigue and weight loss.  HENT: Negative.  Negative for nosebleeds.   Eyes: Negative.  Negative for blurred vision, double vision and photophobia.    Respiratory: Negative.  Negative for cough and shortness of breath.   Cardiovascular: Negative.  Negative for chest pain, palpitations and leg swelling.  Gastrointestinal: Negative.  Negative for abdominal pain, constipation, diarrhea, heartburn, nausea and vomiting.  Musculoskeletal: Negative.  Negative for myalgias.  Neurological: Negative.  Negative for dizziness, focal weakness, seizures and headaches.  Endo/Heme/Allergies: Negative for environmental allergies.  Psychiatric/Behavioral: Negative.  Negative for suicidal ideas.    Past Medical History:  Diagnosis Date  . Gestational diabetes   . Hypertension     Past Surgical History:  Procedure Laterality Date  . DILATION AND CURETTAGE OF UTERUS    . HYSTEROSCOPY WITH RESECTOSCOPE  2011    Family History  Problem Relation Age of Onset  . Alcohol abuse Neg Hx     Social History Reviewed with no changes to be made today.   Outpatient Medications Prior to Visit  Medication Sig Dispense Refill  . ACCU-CHEK FASTCLIX LANCETS MISC 1 Units by Percutaneous route 4 (four) times daily. (Patient not taking: Reported on 02/18/2017) 100 each 12  . acetaminophen (TYLENOL) 325 MG tablet Take 650 mg by mouth every 6 (six) hours as needed.    . Prenatal Vit-Fe Fumarate-FA (MULTIVITAMIN-PRENATAL) 27-0.8 MG TABS tablet Take 1 tablet by mouth daily at 12 noon.    Marland Kitchen. amLODipine (NORVASC) 10 MG tablet Take 1 tablet (10 mg total) by mouth daily. (Patient not taking: Reported on 02/18/2017) 30 tablet 1  . docusate sodium (COLACE) 100 MG capsule Take 1 capsule (  100 mg total) by mouth 2 (two) times daily as needed for mild constipation. (Patient not taking: Reported on 02/18/2017) 60 capsule 3  . phenazopyridine (PYRIDIUM) 200 MG tablet Take 1 tablet (200 mg total) by mouth 3 (three) times daily as needed for pain (urethral spasm). (Patient not taking: Reported on 02/18/2017) 10 tablet 1  . sulfamethoxazole-trimethoprim (BACTRIM DS,SEPTRA DS) 800-160 MG  tablet Take 1 tablet by mouth 2 (two) times daily. (Patient not taking: Reported on 02/18/2017) 14 tablet 1   No facility-administered medications prior to visit.     No Known Allergies     Objective:    BP (!) 150/110 (BP Location: Left Arm, Patient Position: Sitting, Cuff Size: Normal)   Pulse 72   Temp 98.3 F (36.8 C) (Oral)   Ht 5\' 6"  (1.676 m)   Wt 206 lb 3.2 oz (93.5 kg)   SpO2 98%   BMI 33.28 kg/m  Wt Readings from Last 3 Encounters:  02/18/17 206 lb 3.2 oz (93.5 kg)  02/08/17 203 lb 1.3 oz (92.1 kg)  01/21/17 205 lb (93 kg)    Physical Exam  Constitutional: She is oriented to person, place, and time. She appears well-developed and well-nourished. She is cooperative.  HENT:  Head: Normocephalic and atraumatic.  Eyes: EOM are normal.  Neck: Normal range of motion.  Cardiovascular: Normal rate, regular rhythm, normal heart sounds and intact distal pulses. Exam reveals no gallop and no friction rub.  No murmur heard. Pulmonary/Chest: Effort normal and breath sounds normal. No tachypnea. No respiratory distress. She has no decreased breath sounds. She has no wheezes. She has no rhonchi. She has no rales. She exhibits no tenderness.  Abdominal: Soft. Bowel sounds are normal.  Musculoskeletal: Normal range of motion. She exhibits no edema.  Neurological: She is alert and oriented to person, place, and time. Coordination normal.  Skin: Skin is warm and dry.  Psychiatric: She has a normal mood and affect. Her behavior is normal. Judgment and thought content normal.  Nursing note and vitals reviewed.     Patient has been counseled extensively about nutrition and exercise as well as the importance of adherence with medications and regular follow-up. The patient was given clear instructions to go to ER or return to medical center if symptoms don't improve, worsen or new problems develop. The patient verbalized understanding.   Follow-up: Return in about 2 weeks (around  03/04/2017) for BP recheck.   Claiborne Rigg, FNP-BC [facility] and [facility] [place], [place] [phone]   02/18/2017, 11:14 AM

## 2017-02-19 LAB — HEMOGLOBIN A1C
Est. average glucose Bld gHb Est-mCnc: 169 mg/dL
Hgb A1c MFr Bld: 7.5 % — ABNORMAL HIGH (ref 4.8–5.6)

## 2017-02-22 ENCOUNTER — Telehealth: Payer: Self-pay

## 2017-02-22 NOTE — Telephone Encounter (Signed)
CMA attempt to call patient.  No answer and unable to leave a VM due to have not been set up.   Letter will be send out.  If patient call, please let patient know:  A1c is still showing diabetes. As this is a 3 month indicator and she only had the baby 2 months ago I would like to check a fasting glucose level.  Please have her make a nurse visit for a fasting glucose check. She should not eat or drink anything but water  after midnight. She can make an 830 appointment and she can take her blood pressure medication as well.

## 2017-02-22 NOTE — Telephone Encounter (Signed)
-----   Message from Claiborne RiggZelda W Fleming, NP sent at 02/19/2017  8:54 AM EST ----- A1c is still showing diabetes. As this is a 3 month indicator and she only had the baby 2 months ago I would like to check a fasting glucose level.  Please have her make a nurse visit for a fasting glucose check. She should not eat or drink anything but water  after midnight. She can make an 830 appointment and she can take her blood pressure medication as well.

## 2017-02-26 ENCOUNTER — Ambulatory Visit: Payer: Self-pay

## 2017-03-19 ENCOUNTER — Ambulatory Visit: Payer: Self-pay | Admitting: Nurse Practitioner

## 2017-03-21 ENCOUNTER — Encounter: Payer: Self-pay | Admitting: Nurse Practitioner

## 2017-03-21 ENCOUNTER — Ambulatory Visit: Payer: Self-pay | Attending: Nurse Practitioner | Admitting: Nurse Practitioner

## 2017-03-21 VITALS — BP 119/81 | HR 84 | Temp 98.1°F | Ht 66.0 in | Wt 202.6 lb

## 2017-03-21 DIAGNOSIS — I10 Essential (primary) hypertension: Secondary | ICD-10-CM

## 2017-03-21 DIAGNOSIS — O24419 Gestational diabetes mellitus in pregnancy, unspecified control: Secondary | ICD-10-CM

## 2017-03-21 DIAGNOSIS — O24414 Gestational diabetes mellitus in pregnancy, insulin controlled: Secondary | ICD-10-CM | POA: Insufficient documentation

## 2017-03-21 DIAGNOSIS — Z0130 Encounter for examination of blood pressure without abnormal findings: Secondary | ICD-10-CM | POA: Insufficient documentation

## 2017-03-21 MED ORDER — AMLODIPINE BESYLATE 10 MG PO TABS: 10.0000 mg | ORAL_TABLET | Freq: Every day | ORAL | 1 refills | Status: DC

## 2017-03-21 MED FILL — AMLODIPINE BESYLATE 10 MG T: 10 | 30 days supply | Qty: 30 | Fill #0

## 2017-03-21 NOTE — Patient Instructions (Addendum)
How to Take Your Blood Pressure You can take your blood pressure at home with a machine. You may need to check your blood pressure at home:  To check if you have high blood pressure (hypertension).  To check your blood pressure over time.  To make sure your blood pressure medicine is working.  Supplies needed: You will need a blood pressure machine, or monitor. You can buy one at a drugstore or online. When choosing one:  Choose one with an arm cuff.  Choose one that wraps around your upper arm. Only one finger should fit between your arm and the cuff.  Do not choose one that measures your blood pressure from your wrist or finger.  Your doctor can suggest a monitor. How to prepare Avoid these things for 30 minutes before checking your blood pressure:  Drinking caffeine.  Drinking alcohol.  Eating.  Smoking.  Exercising.  Five minutes before checking your blood pressure:  Pee.  Sit in a dining chair. Avoid sitting in a soft couch or armchair.  Be quiet. Do not talk.  How to take your blood pressure Follow the instructions that came with your machine. If you have a digital blood pressure monitor, these may be the instructions: 1. Sit up straight. 2. Place your feet on the floor. Do not cross your ankles or legs. 3. Rest your left arm at the level of your heart. You may rest it on a table, desk, or chair. 4. Pull up your shirt sleeve. 5. Wrap the blood pressure cuff around the upper part of your left arm. The cuff should be 1 inch (2.5 cm) above your elbow. It is best to wrap the cuff around bare skin. 6. Fit the cuff snugly around your arm. You should be able to place only one finger between the cuff and your arm. 7. Put the cord inside the groove of your elbow. 8. Press the power button. 9. Sit quietly while the cuff fills with air and loses air. 10. Write down the numbers on the screen. 11. Wait 2-3 minutes and then repeat steps 1-10.  What do the numbers  mean? Two numbers make up your blood pressure. The first number is called systolic pressure. The second is called diastolic pressure. An example of a blood pressure reading is "120 over 80" (or 120/80). If you are an adult and do not have a medical condition, use this guide to find out if your blood pressure is normal: Normal  First number: below 120.  Second number: below 80. Elevated  First number: 120-129.  Second number: below 80. Hypertension stage 1  First number: 130-139.  Second number: 80-89. Hypertension stage 2  First number: 140 or above.  Second number: 90 or above. Your blood pressure is above normal even if only the top or bottom number is above normal. Follow these instructions at home:  Check your blood pressure as often as your doctor tells you to.  Take your monitor to your next doctor's appointment. Your doctor will: ? Make sure you are using it correctly. ? Make sure it is working right.  Make sure you understand what your blood pressure numbers should be.  Tell your doctor if your medicines are causing side effects. Contact a doctor if:  Your blood pressure keeps being high. Get help right away if:  Your first blood pressure number is higher than 180.  Your second blood pressure number is higher than 120. This information is not intended to replace advice given   to you by your health care provider. Make sure you discuss any questions you have with your health care provider. Document Released: 12/08/2007 Document Revised: 11/23/2015 Document Reviewed: 06/03/2015 Elsevier Interactive Patient Education  2018 Elsevier Inc.  

## 2017-03-21 NOTE — Progress Notes (Signed)
Assessment & Plan:  [name] was seen today for blood pressure check and medication refill.  Diagnoses and all orders for this visit:  Essential hypertension -     amLODipine (NORVASC) 10 MG tablet; Take 1 tablet (10 mg total) by mouth daily. Continue all antihypertensives as prescribed.  Remember to bring in your blood pressure log with you for your follow up appointment.  DASH/Mediterranean Diets are healthier choices for HTN.    Early Dx GDM (early 2nd trimester) Next A1c scheduled for May, 2019  Patient has been counseled on age-appropriate routine health concerns for screening and prevention. These are reviewed and up-to-date. Referrals have been placed accordingly. Immunizations are up-to-date or declined.    Subjective:   Chief Complaint  Patient presents with  . Blood Pressure Check    Pt's is here for a blood pressure check.   . Medication Refill    Pt's need medication refills.    HPI [name] 47 y.o. female presents to office today for hypertension.  She is accompanied by her husband who is translating for her today.   ESSENTIAL HYPERTENSION Patient was restarted on amlodipine at her last office visit on 02/18/2017 for poorly controlled blood pressure.  At that time she had reportedly run out of her blood pressure medication and had not taken it in over a month.  Today her blood pressure is very well controlled.  She endorses medication compliance.Denies chest pain, shortness of breath, palpitations, lightheadedness, dizziness, headaches or BLE edema.  BP Readings from Last 3 Encounters:  03/21/17 119/81  02/18/17 (!) 150/110  02/08/17 (!) 141/83   GESTATIONAL DIABETES  She has a history of gestational diabetes requiring insulin.  Will recheck her A1c in May.  It is likely that she may have to go on metformin at that time.  Her last A1c was resulted shortly after giving birth.  She currently denies any hypo-or hyperglycemic symptoms.  Lab Results  Component Value  Date   HGBA1C 7.5 (H) 02/18/2017    Review of Systems  Constitutional: Negative for fever, malaise/fatigue and weight loss.  HENT: Negative.  Negative for nosebleeds.   Eyes: Negative.  Negative for blurred vision, double vision and photophobia.  Respiratory: Negative.  Negative for cough and shortness of breath.   Cardiovascular: Negative.  Negative for chest pain, palpitations and leg swelling.  Gastrointestinal: Negative.  Negative for heartburn, nausea and vomiting.  Musculoskeletal: Negative.  Negative for myalgias.  Neurological: Negative.  Negative for dizziness, focal weakness, seizures and headaches.  Psychiatric/Behavioral: Negative.  Negative for suicidal ideas.    Past Medical History:  Diagnosis Date  . Gestational diabetes   . Hypertension     Past Surgical History:  Procedure Laterality Date  . DILATION AND CURETTAGE OF UTERUS    . HYSTEROSCOPY WITH RESECTOSCOPE  2011    Family History  Problem Relation Age of Onset  . Alcohol abuse Neg Hx     Social History Reviewed with no changes to be made today.   Outpatient Medications Prior to Visit  Medication Sig Dispense Refill  . amLODipine (NORVASC) 10 MG tablet Take 1 tablet (10 mg total) by mouth daily. 30 tablet 1  . ACCU-CHEK FASTCLIX LANCETS MISC 1 Units by Percutaneous route 4 (four) times daily. (Patient not taking: Reported on 02/18/2017) 100 each 12  . acetaminophen (TYLENOL) 325 MG tablet Take 650 mg by mouth every 6 (six) hours as needed.    . Prenatal Vit-Fe Fumarate-FA (MULTIVITAMIN-PRENATAL) 27-0.8 MG TABS tablet Take  1 tablet by mouth daily at 12 noon.     No facility-administered medications prior to visit.     No Known Allergies     Objective:    BP 119/81 (BP Location: Left Arm, Patient Position: Sitting, Cuff Size: Normal)   Pulse 84   Temp 98.1 F (36.7 C) (Oral)   Ht 5\' 6"  (1.676 m)   Wt 202 lb 9.6 oz (91.9 kg)   LMP 03/18/2017   SpO2 95%   BMI 32.70 kg/m  Wt Readings from Last  3 Encounters:  03/21/17 202 lb 9.6 oz (91.9 kg)  02/18/17 206 lb 3.2 oz (93.5 kg)  02/08/17 203 lb 1.3 oz (92.1 kg)    Physical Exam  Constitutional: She is oriented to person, place, and time. She appears well-developed and well-nourished. She is cooperative.  HENT:  Head: Normocephalic and atraumatic.  Eyes: EOM are normal.  Neck: Normal range of motion.  Cardiovascular: Normal rate, regular rhythm and normal heart sounds. Exam reveals no gallop and no friction rub.  No murmur heard. Pulmonary/Chest: Effort normal and breath sounds normal. No tachypnea. No respiratory distress. She has no decreased breath sounds. She has no wheezes. She has no rhonchi. She has no rales. She exhibits no tenderness.  Abdominal: Soft. Bowel sounds are normal.  Musculoskeletal: Normal range of motion. She exhibits no edema.  Neurological: She is alert and oriented to person, place, and time. Coordination normal.  Skin: Skin is warm and dry.  Psychiatric: She has a normal mood and affect. Her behavior is normal. Judgment and thought content normal.  Nursing note and vitals reviewed.     Patient has been counseled extensively about nutrition and exercise as well as the importance of adherence with medications and regular follow-up. The patient was given clear instructions to go to ER or return to medical center if symptoms don't improve, worsen or new problems develop. The patient verbalized understanding.   Follow-up: Return in about 2 months (around 05/21/2017) for BP recheck/A1c.   Claiborne Rigg, FNP-BC [facility] and [facility] [place], [place] [phone]   03/21/2017, 9:56 AM

## 2017-05-21 ENCOUNTER — Ambulatory Visit: Payer: Self-pay | Admitting: Nurse Practitioner

## 2017-06-24 ENCOUNTER — Ambulatory Visit: Payer: Self-pay | Attending: Nurse Practitioner | Admitting: Nurse Practitioner

## 2017-06-24 ENCOUNTER — Encounter: Payer: Self-pay | Admitting: Nurse Practitioner

## 2017-06-24 VITALS — BP 137/96 | HR 93 | Temp 99.4°F | Ht 66.0 in | Wt 205.6 lb

## 2017-06-24 DIAGNOSIS — Z8632 Personal history of gestational diabetes: Secondary | ICD-10-CM

## 2017-06-24 DIAGNOSIS — I10 Essential (primary) hypertension: Secondary | ICD-10-CM | POA: Insufficient documentation

## 2017-06-24 DIAGNOSIS — Z79899 Other long term (current) drug therapy: Secondary | ICD-10-CM | POA: Insufficient documentation

## 2017-06-24 DIAGNOSIS — E1165 Type 2 diabetes mellitus with hyperglycemia: Secondary | ICD-10-CM | POA: Insufficient documentation

## 2017-06-24 LAB — GLUCOSE, POCT (MANUAL RESULT ENTRY): POC GLUCOSE: 285 mg/dL — AB (ref 70–99)

## 2017-06-24 LAB — POCT GLYCOSYLATED HEMOGLOBIN (HGB A1C): HEMOGLOBIN A1C: 9.1 % — AB (ref 4.0–5.6)

## 2017-06-24 MED ORDER — TRUE METRIX METER W/DEVICE KIT: PACK | 0 refills | Status: DC

## 2017-06-24 MED ORDER — AMLODIPINE BESYLATE 10 MG PO TABS: 10.0000 mg | ORAL_TABLET | Freq: Every day | ORAL | 1 refills | Status: DC

## 2017-06-24 MED ORDER — GLUCOSE BLOOD VI STRP
ORAL_STRIP | 12 refills | Status: DC
Start: 2017-06-24 — End: 2018-07-04

## 2017-06-24 MED ORDER — TRUEPLUS LANCETS 28G MISC: 3 refills | Status: DC

## 2017-06-24 MED ORDER — GLYBURIDE 5 MG PO TABS: 5.0000 mg | ORAL_TABLET | Freq: Two times a day (BID) | ORAL | 3 refills | Status: DC

## 2017-06-24 MED FILL — !TRUE METRIX BLOOD GLUCOSE: 1 days supply | Qty: 1 | Fill #0

## 2017-06-24 MED FILL — AMLODIPINE BESYLATE 10 MG T: 10 | 30 days supply | Qty: 30 | Fill #1

## 2017-06-24 MED FILL — glyBURIDE 5 MG TABS: 5 | 30 days supply | Qty: 60 | Fill #0

## 2017-06-24 MED FILL — TRUE METRIX TEST STRIP: 25 days supply | Qty: 100 | Fill #0

## 2017-06-24 MED FILL — TRUEplus LANCETS 28G MISC: 25 days supply | Qty: 100 | Fill #0

## 2017-06-24 NOTE — Progress Notes (Signed)
Assessment & Plan:  [name] was seen today for follow-up.  Diagnoses and all orders for this visit:  Uncontrolled type 2 diabetes mellitus with hyperglycemia ([place]) -     HgB A1c -     Cancel: Glucose (CBG) -     Glucose (CBG) -     glyBURIDE (DIABETA) 5 MG tablet; Take 1 tablet (5 mg total) by mouth 2 (two) times daily with a meal. -     Blood Glucose Monitoring Suppl (TRUE METRIX METER) w/Device KIT; Use as instructed -     TRUEPLUS LANCETS 28G MISC; Use as instructed -     glucose blood (TRUE METRIX BLOOD GLUCOSE TEST) test strip; Use as instructed -     Microalbumin / creatinine urine ratio -     CMP14+EGFR -     Lipid panel Continue blood sugar control as discussed in office today, low carbohydrate diet, and regular physical exercise as tolerated, 150 minutes per week (30 min each day, 5 days per week, or 50 min 3 days per week). Keep blood sugar logs with fasting goal of 80-130 mg/dl, post prandial less than 180.  For Hypoglycemia: BS <60 and Hyperglycemia BS >400; contact the clinic ASAP. Annual eye exams and foot exams are recommended.  Essential hypertension -     amLODipine (NORVASC) 10 MG tablet; Take 1 tablet (10 mg total) by mouth daily. Continue all antihypertensives as prescribed.  Remember to bring in your blood pressure log with you for your follow up appointment.  DASH/Mediterranean Diets are healthier choices for HTN.    Patient has been counseled on age-appropriate routine health concerns for screening and prevention. These are reviewed and up-to-date. Referrals have been placed accordingly. Immunizations are up-to-date or declined.    Subjective:   Chief Complaint  Patient presents with  . Follow-up    Pt. is here for blood pressure and A1C check.    HPI [name] 47 y.o. female presents to office today for follow up to HTN and DM. Her spouse is her interpreting for her today.   Essential Hypertension She ran out of her amlodipine for several days.  Blood pressure is slightly elevated today.  She was not aware that she could obtain a refill by calling the pharmacy as she does have several refills available. Denies chest pain, shortness of breath, palpitations, lightheadedness, dizziness, headaches or BLE edema.  BP Readings from Last 3 Encounters:  06/24/17 (!) 137/96  03/21/17 119/81  02/18/17 (!) 150/110   DM A1c up from 7.5 to 9.1. She reports drinking a special tea for Ramadan during the past month as well as diet noncompliance. She has not been on any oral agents so will restart her diabeta. She is not checking her blood sugars as she reports not having a meter. Will order supplies for her today.She has been instructed to check her blood sugars daily and we discussed dietary and exercise modifications today. She denies any hypo or hyperglycemic symptoms at this time. She is not diet or exercise compliant.  Lab Results  Component Value Date   HGBA1C 9.1 (A) 06/24/2017   Review of Systems  Constitutional: Negative for fever, malaise/fatigue and weight loss.  HENT: Negative.  Negative for nosebleeds.   Eyes: Negative.  Negative for blurred vision, double vision and photophobia.  Respiratory: Negative.  Negative for cough and shortness of breath.   Cardiovascular: Negative.  Negative for chest pain, palpitations and leg swelling.  Gastrointestinal: Negative.  Negative for heartburn, nausea and  vomiting.  Musculoskeletal: Negative.  Negative for myalgias.  Neurological: Negative.  Negative for dizziness, focal weakness, seizures and headaches.  Psychiatric/Behavioral: Negative.  Negative for suicidal ideas.    Past Medical History:  Diagnosis Date  . Gestational diabetes   . Hypertension   . Type II diabetes mellitus with complication [facility])     Past Surgical History:  Procedure Laterality Date  . DILATION AND CURETTAGE OF UTERUS    . HYSTEROSCOPY WITH RESECTOSCOPE  2011    Family History  Problem Relation Age of Onset  .  Alcohol abuse Neg Hx     Social History Reviewed with no changes to be made today.   Outpatient Medications Prior to Visit  Medication Sig Dispense Refill  . amLODipine (NORVASC) 10 MG tablet Take 1 tablet (10 mg total) by mouth daily. 90 tablet 1  . acetaminophen (TYLENOL) 325 MG tablet Take 650 mg by mouth every 6 (six) hours as needed.    . Prenatal Vit-Fe Fumarate-FA (MULTIVITAMIN-PRENATAL) 27-0.8 MG TABS tablet Take 1 tablet by mouth daily at 12 noon.    Marland Kitchen ACCU-CHEK FASTCLIX LANCETS MISC 1 Units by Percutaneous route 4 (four) times daily. (Patient not taking: Reported on 02/18/2017) 100 each 12   No facility-administered medications prior to visit.     No Known Allergies     Objective:    BP (!) 137/96 (BP Location: Right Arm, Patient Position: Sitting, Cuff Size: Normal)   Pulse 93   Temp 99.4 F (37.4 C) (Oral)   Ht _0  (1.676 m)   Wt 205 lb 9.6 oz (93.3 kg)   LMP 06/07/2017   SpO2 95%   BMI 33.18 kg/m  Wt Readings from Last 3 Encounters:  06/24/17 205 lb 9.6 oz (93.3 kg)  03/21/17 202 lb 9.6 oz (91.9 kg)  02/18/17 206 lb 3.2 oz (93.5 kg)    Physical Exam  Constitutional: She is oriented to person, place, and time. She appears well-developed and well-nourished. She is cooperative.  HENT:  Head: Normocephalic and atraumatic.  Eyes: EOM are normal.  Neck: Normal range of motion.  Cardiovascular: Normal rate, regular rhythm, normal heart sounds and intact distal pulses. Exam reveals no gallop and no friction rub.  No murmur heard. Pulses:      Dorsalis pedis pulses are 2+ on the right side, and 2+ on the left side.       Posterior tibial pulses are 2+ on the right side, and 2+ on the left side.  Pulmonary/Chest: Effort normal and breath sounds normal. No tachypnea. No respiratory distress. She has no decreased breath sounds. She has no wheezes. She has no rhonchi. She has no rales. She exhibits no tenderness.  Abdominal: Soft. Bowel sounds are normal.    Musculoskeletal: Normal range of motion. She exhibits no edema.  BIlateral feet and hands have some sort of cultural reddish dye applied  Feet:  Right Foot:  Protective Sensation: 10 sites tested. 10 sites sensed.  Skin Integrity: Positive for callus and dry skin. Negative for ulcer, blister or skin breakdown.  Left Foot:  Protective Sensation: 10 sites tested. 10 sites sensed.  Skin Integrity: Positive for callus and dry skin. Negative for ulcer, blister or skin breakdown.  Neurological: She is alert and oriented to person, place, and time. Coordination normal.  Skin: Skin is warm and dry.  Psychiatric: She has a normal mood and affect. Her behavior is normal. Judgment and thought content normal.  Nursing note and vitals reviewed.  Patient has been counseled extensively about nutrition and exercise as well as the importance of adherence with medications and regular follow-up. The patient was given clear instructions to go to ER or return to medical center if symptoms don't improve, worsen or new problems develop. The patient verbalized understanding.   Follow-up: Return in about 3 months (around 09/24/2017) for HTN/DM.   Gildardo Pounds, FNP-BC [facility] and [place], [place]   06/24/2017, 1:28 PM

## 2017-06-24 NOTE — Patient Instructions (Signed)

## 2017-06-24 NOTE — Progress Notes (Signed)
285 

## 2017-06-25 LAB — CMP14+EGFR
A/G RATIO: 1.3 (ref 1.2–2.2)
ALT: 18 IU/L (ref 0–32)
AST: 14 IU/L (ref 0–40)
Albumin: 4 g/dL (ref 3.5–5.5)
Alkaline Phosphatase: 86 IU/L (ref 39–117)
BUN/Creatinine Ratio: 24 — ABNORMAL HIGH (ref 9–23)
BUN: 16 mg/dL (ref 6–24)
Bilirubin Total: <0.2 mg/dL (ref 0.0–1.2)
CALCIUM: 9.8 mg/dL (ref 8.7–10.2)
CO2: 20 mmol/L (ref 20–29)
Chloride: 102 mmol/L (ref 96–106)
Creatinine, Ser: 0.68 mg/dL (ref 0.57–1.00)
GFR, EST AFRICAN AMERICAN: 122 mL/min/{1.73_m2} (ref >59)
GFR, EST NON AFRICAN AMERICAN: 106 mL/min/{1.73_m2} (ref >59)
GLOBULIN, TOTAL: 3.2 g/dL (ref 1.5–4.5)
Glucose: 274 mg/dL — ABNORMAL HIGH (ref 65–99)
POTASSIUM: 4.5 mmol/L (ref 3.5–5.2)
SODIUM: 135 mmol/L (ref 134–144)
TOTAL PROTEIN: 7.2 g/dL (ref 6.0–8.5)

## 2017-06-25 LAB — LIPID PANEL
CHOL/HDL RATIO: 4.6 ratio — AB (ref 0.0–4.4)
Cholesterol, Total: 188 mg/dL (ref 100–199)
HDL: 41 mg/dL (ref >39)
LDL Calculated: 101 mg/dL — ABNORMAL HIGH (ref 0–99)
Triglycerides: 230 mg/dL — ABNORMAL HIGH (ref 0–149)
VLDL Cholesterol Cal: 46 mg/dL — ABNORMAL HIGH (ref 5–40)

## 2017-06-25 LAB — MICROALBUMIN / CREATININE URINE RATIO
Creatinine, Urine: 96.9 mg/dL
Microalb/Creat Ratio: 26.8 mg/g creat (ref 0.0–30.0)
Microalbumin, Urine: 26 ug/mL

## 2017-06-26 ENCOUNTER — Other Ambulatory Visit: Payer: Self-pay | Admitting: Nurse Practitioner

## 2017-06-26 MED ORDER — ATORVASTATIN CALCIUM 20 MG PO TABS
20.0000 mg | ORAL_TABLET | Freq: Every day | ORAL | 3 refills | Status: DC
Start: 2017-06-26 — End: 2018-07-04

## 2017-06-27 MED FILL — ATORVASTATIN 20 MG TABLET: 20 | 30 days supply | Qty: 30 | Fill #0

## 2017-07-02 ENCOUNTER — Telehealth: Payer: Self-pay

## 2017-07-02 NOTE — Telephone Encounter (Signed)
CMA spoke to patient to inform on lab results and Rx.  Patient understood.

## 2017-07-02 NOTE — Telephone Encounter (Signed)
-----   Message from Claiborne RiggZelda W Fleming, NP sent at 06/26/2017  8:24 PM EDT ----- Please call patient: Tests show increased cholesterol/lipid levels. Will need to start on statin or cholesterol/lipid lowering medication. Prescription has been sent to the pharmacy. Patient should work on a low fat, heart healthy diet and participate in regular aerobic exercise program to control as well by working out at least 150 minutes per week. No fried foods. No junk foods, sodas, sugary drinks, unhealthy snacking, or smoking.

## 2017-09-27 ENCOUNTER — Ambulatory Visit: Payer: Self-pay | Admitting: Nurse Practitioner

## 2017-11-21 ENCOUNTER — Inpatient Hospital Stay (HOSPITAL_COMMUNITY)
Admission: AD | Admit: 2017-11-21 | Discharge: 2017-11-21 | Disposition: A | Discharge status: Home / Self Care | Payer: Self-pay | Source: Ambulatory Visit | Attending: Obstetrics and Gynecology | Admitting: Obstetrics and Gynecology

## 2017-11-21 ENCOUNTER — Encounter (HOSPITAL_COMMUNITY): Payer: Self-pay | Admitting: *Deleted

## 2017-11-21 DIAGNOSIS — Z3202 Encounter for pregnancy test, result negative: Secondary | ICD-10-CM | POA: Insufficient documentation

## 2017-11-21 DIAGNOSIS — N939 Abnormal uterine and vaginal bleeding, unspecified: Secondary | ICD-10-CM | POA: Insufficient documentation

## 2017-11-21 DIAGNOSIS — N946 Dysmenorrhea, unspecified: Secondary | ICD-10-CM | POA: Insufficient documentation

## 2017-11-21 LAB — URINALYSIS, ROUTINE W REFLEX MICROSCOPIC

## 2017-11-21 LAB — URINALYSIS, MICROSCOPIC (REFLEX)
RBC / HPF: >50 RBC/hpf (ref 0–5)
WBC, UA: NONE SEEN WBC/hpf (ref 0–5)

## 2017-11-21 LAB — CBC
HEMATOCRIT: 43.7 % (ref 36.0–46.0)
HEMOGLOBIN: 14.2 g/dL (ref 12.0–15.0)
MCH: 24.4 pg — AB (ref 26.0–34.0)
MCHC: 32.5 g/dL (ref 30.0–36.0)
MCV: 75.2 fL — AB (ref 80.0–100.0)
Platelets: 335 10*3/uL (ref 150–400)
RBC: 5.81 MIL/uL — AB (ref 3.87–5.11)
RDW: 15 % (ref 11.5–15.5)
WBC: 8.2 10*3/uL (ref 4.0–10.5)
nRBC: 0 % (ref 0.0–0.2)

## 2017-11-21 LAB — POCT PREGNANCY, URINE: PREG TEST UR: NEGATIVE

## 2017-11-21 NOTE — MAU Provider Note (Signed)
Chief Complaint:  Vaginal Bleeding   First Provider Initiated Contact with Patient 11/21/17 2300      HPI: [name] is a 47 y.o. G2P2001 who presents to maternity admissions reporting vaginal bleeding since earlier today.  Last period was in August.  States usually has regular periods  Husband is Optometrist. Has some cramping in lower abdomen that is mild.  States the main reason she is here is to find out if she is pregnant. . She reports vaginal bleeding, but denies vaginal itching/burning, urinary symptoms, h/a, dizziness, n/v, or fever/chills.    Just had a baby 12/14/16   Followed by [place] [facility] for medical problems  HPI RN Note: Pt presents to MAU c/o vaginal bleeding. Pt states her LMP was in August. Pt is unsure if she is pregnant. Pt also reports lower abdominal pain.   Past Medical History: Past Medical History:  Diagnosis Date  . Gestational diabetes   . Hypertension   . Type II diabetes mellitus with complication (H[facility])     Past obstetric history: OB History  Gravida Para Term Preterm AB Living  2 2 2     1   SAB TAB Ectopic Multiple Live Births          2    # Outcome Date GA Lbr Len/2nd Weight Sex Delivery Anes PTL Lv  2 Term 12/14/16 [id number] / 00:11 2800 g M Vag-Vacuum None  LIV  1 Term 08/11/97 [id number] g F Vag-Spont   DEC     Complications: Malaria    Past Surgical History: Past Surgical History:  Procedure Laterality Date  . DILATION AND CURETTAGE OF UTERUS    . HYSTEROSCOPY WITH RESECTOSCOPE  2011    Family History: Family History  Problem Relation Age of Onset  . Alcohol abuse Neg Hx     Social History: Social History   Tobacco Use  . Smoking status: Never Smoker  . Smokeless tobacco: Never Used  Substance Use Topics  . Alcohol use: No  . Drug use: No    Allergies: No Known Allergies  Meds:  Medications Prior to Admission  Medication Sig Dispense Refill Last Dose  . acetaminophen (TYLENOL) 325 MG tablet Take 650  mg by mouth every 6 (six) hours as needed.   11/20/2017 at Unknown time  . amLODipine (NORVASC) 10 MG tablet Take 1 tablet (10 mg total) by mouth daily. 90 tablet 1 More than a month at Unknown time  . atorvastatin (LIPITOR) 20 MG tablet Take 1 tablet (20 mg total) by mouth daily. Do not take if breastfeeding 90 tablet 3 More than a month at Unknown time  . Blood Glucose Monitoring Suppl (TRUE METRIX METER) w/Device KIT Use as instructed 1 kit 0 More than a month at Unknown time  . glucose blood (TRUE METRIX BLOOD GLUCOSE TEST) test strip Use as instructed 100 each 12 More than a month at Unknown time  . glyBURIDE (DIABETA) 5 MG tablet Take 1 tablet (5 mg total) by mouth 2 (two) times daily with a meal. 60 tablet 3 More than a month at Unknown time  . Prenatal Vit-Fe Fumarate-FA (MULTIVITAMIN-PRENATAL) 27-0.8 MG TABS tablet Take 1 tablet by mouth daily at 12 noon.   More than a month at Unknown time  . TRUEPLUS LANCETS 28G MISC Use as instructed 100 each 3 More than a month at Unknown time    I have reviewed patient's Past Medical Hx, Surgical Hx, Family Hx, Social Hx, medications and  allergies.  ROS:  Review of Systems  Respiratory: Negative for shortness of breath.   Gastrointestinal: Negative for constipation, diarrhea, nausea and vomiting.  Genitourinary: Positive for pelvic pain and vaginal bleeding. Negative for dysuria.  Musculoskeletal: Negative for back pain.  Neurological: Negative for dizziness.   Other systems negative     Physical Exam  No data found. Constitutional: Well-developed, well-nourished female in no acute distress.  Cardiovascular: normal rate and rhythm, no ectopy audible, S1 & S2 heard, no murmur Respiratory: normal effort, no distress. Lungs CTAB with no wheezes or crackles GI: Abd soft, non-tender.  Nondistended.  No rebound, No guarding.  Bowel Sounds audible  MS: Extremities nontender, no edema, normal ROM Neurologic: Alert and oriented x 4.   Grossly  nonfocal. GU: Neg CVAT. Skin:  Warm and Dry Psych:  Affect appropriate.  PELVIC EXAM: Cervix pink, visually closed, without lesion, small to moderate bloody discharge, vaginal walls and external genitalia normal Bimanual exam: Cervix firm, anterior, neg CMT, uterus nontender, nonenlarged, adnexa without tenderness, enlargement, or mass    Labs: --/--/O POS (12/06 1329) Results for orders placed or performed during the hospital encounter of 11/21/17 (from the past 24 hour(s))  Urinalysis, Routine w reflex microscopic     Status: Abnormal   Collection Time: 11/21/17  9:42 PM  Result Value Ref Range   Color, Urine RED (A) YELLOW   APPearance CLOUDY (A) CLEAR   Specific Gravity, Urine  1.005 - 1.030    TEST NOT REPORTED DUE TO COLOR INTERFERENCE OF URINE PIGMENT   pH  5.0 - 8.0    TEST NOT REPORTED DUE TO COLOR INTERFERENCE OF URINE PIGMENT   Glucose, UA (A) NEGATIVE mg/dL    TEST NOT REPORTED DUE TO COLOR INTERFERENCE OF URINE PIGMENT   Hgb urine dipstick (A) NEGATIVE    TEST NOT REPORTED DUE TO COLOR INTERFERENCE OF URINE PIGMENT   Bilirubin Urine (A) NEGATIVE    TEST NOT REPORTED DUE TO COLOR INTERFERENCE OF URINE PIGMENT   Ketones, ur (A) NEGATIVE mg/dL    TEST NOT REPORTED DUE TO COLOR INTERFERENCE OF URINE PIGMENT   Protein, ur (A) NEGATIVE mg/dL    TEST NOT REPORTED DUE TO COLOR INTERFERENCE OF URINE PIGMENT   Nitrite (A) NEGATIVE    TEST NOT REPORTED DUE TO COLOR INTERFERENCE OF URINE PIGMENT   Leukocytes, UA (A) NEGATIVE    TEST NOT REPORTED DUE TO COLOR INTERFERENCE OF URINE PIGMENT  Urinalysis, Microscopic (reflex)     Status: Abnormal   Collection Time: 11/21/17  9:42 PM  Result Value Ref Range   RBC / HPF >50 0 - 5 RBC/hpf   WBC, UA NONE SEEN 0 - 5 WBC/hpf   Bacteria, UA RARE (A) NONE SEEN   Squamous Epithelial / LPF 0-5 0 - 5   Amorphous Crystal PRESENT   Pregnancy, urine POC     Status: None   Collection Time: 11/21/17 10:12 PM  Result Value Ref Range   Preg  Test, Ur NEGATIVE NEGATIVE  CBC     Status: Abnormal   Collection Time: 11/21/17 10:45 PM  Result Value Ref Range   WBC 8.2 4.0 - 10.5 K/uL   RBC 5.81 (H) 3.87 - 5.11 MIL/uL   Hemoglobin 14.2 12.0 - 15.0 g/dL   HCT 43.7 36.0 - 46.0 %   MCV 75.2 (L) 80.0 - 100.0 fL   MCH 24.4 (L) 26.0 - 34.0 pg   MCHC 32.5 30.0 - 36.0 g/dL   RDW 15.0  11.5 - 15.5 %   Platelets 335 150 - 400 K/uL   nRBC 0.0 0.0 - 0.2 %    Imaging:  No results found.  MAU Course/MDM: I have ordered labs as follows: CBC showed normal Hgb and platelets Imaging ordered: none Results reviewed. She is not pregnant.  Blood loss has been minimal  Discussed she is not pregnant Discussed that since she has not had period since August, her period may be heavier than normal Ibuprofen for cramps  Pt stable at time of discharge.  Assessment: Abnormal uterine bleeding Not pregnant Mild dysmenorrhea  Plan: Discharge home Recommend Bleeding precautions Recommend ibuprofen prn for dysmenorrhea  Encouraged to return here or to other Urgent Care/ED if she develops worsening of symptoms, increase in pain, fever, or other concerning symptoms.   Hansel Feinstein CNM, MSN Certified Nurse-Midwife 11/21/2017 11:00 PM

## 2017-11-21 NOTE — Discharge Instructions (Signed)
Dysfunctional Uterine Bleeding °Dysfunctional uterine bleeding is abnormal bleeding from the uterus. Dysfunctional uterine bleeding includes: °· A period that comes earlier or later than usual. °· A period that is lighter, heavier, or has blood clots. °· Bleeding between periods. °· Skipping one or more periods. °· Bleeding after sexual intercourse. °· Bleeding after menopause. ° °Follow these instructions at home: °Pay attention to any changes in your symptoms. Follow these instructions to help with your condition: °Eating and drinking °· Eat well-balanced meals. Include foods that are high in iron, such as liver, meat, shellfish, green leafy vegetables, and eggs. °· If you become constipated: °? Drink plenty of water. °? Eat fruits and vegetables that are high in water and fiber, such as spinach, carrots, raspberries, apples, and mango. °Medicines °· Take over-the-counter and prescription medicines only as told by your health care provider. °· Do not change medicines without talking with your health care provider. °· Aspirin or medicines that contain aspirin may make the bleeding worse. Do not take those medicines: °? During the week before your period. °? During your period. °· If you were prescribed iron pills, take them as told by your health care provider. Iron pills help to replace iron that your body loses because of this condition. °Activity °· If you need to change your sanitary pad or tampon more than one time every 2 hours: °? Lie in bed with your feet raised (elevated). °? Place a cold pack on your lower abdomen. °? Rest as much as possible until the bleeding stops or slows down. °· Do not try to lose weight until the bleeding has stopped and your blood iron level is back to normal. °Other Instructions °· For two months, write down: °? When your period starts. °? When your period ends. °? When any abnormal bleeding occurs. °? What problems you notice. °· Keep all follow up visits as told by your health  care provider. This is important. °Contact a health care provider if: °· You get light-headed or weak. °· You have nausea and vomiting. °· You cannot eat or drink without vomiting. °· You feel dizzy or have diarrhea while you are taking medicines. °· You are taking birth control pills or hormones, and you want to change them or stop taking them. °Get help right away if: °· You develop a fever or chills. °· You need to change your sanitary pad or tampon more than one time per hour. °· Your bleeding becomes heavier, or your flow contains clots more often. °· You develop pain in your abdomen. °· You lose consciousness. °· You develop a rash. °This information is not intended to replace advice given to you by your health care provider. Make sure you discuss any questions you have with your health care provider. °Document Released: 12/23/1999 Document Revised: 06/02/2015 Document Reviewed: 03/22/2014 °Elsevier Interactive Patient Education © 2018 [employer] ° °In late 2019, the [facility] will be moving to the [place] campus. At that time, the MAU (Maternity Admissions Unit), where you are being seen today, will no longer take care of non-pregnant patients. We strongly encourage you to find a doctor's office before that time, so that you can be seen with any GYN concerns, like vaginal discharge, urinary tract infection, etc.. in a timely manner. ° °In order to make an office visit more convenient, the [facility] will be offering evening hours with same-day appointments, walk-in appointments and scheduled appointments available during this time. ° °[facility] Hours: °Monday - 8am - 7:30 pm with walk-in between 4pm- 7:30 pm °Tuesday - 8 am - 5 pm (starting 04/09/17 we will be open late and accepting walk-ins from 4pm - 7:30pm) °Wednesday - 8 am - 5 pm (starting 07/10/17 we will be open late and accepting walk-ins from 4pm -  7:30pm) °Thursday 8 am - 5 pm (starting 10/10/17 we will be open late and accepting walk-ins from 4pm - 7:30pm) °Friday 8 am - 5 pm ° °For an appointment please call the [facility] at [phone] ° °For urgent needs,  Urgent Care is also available for management of urgent GYN complaints such as vaginal discharge or urinary tract infections. ° ° ° ° ° °

## 2017-11-21 NOTE — MAU Note (Signed)
Pt presents to MAU c/o vaginal bleeding. Pt states her LMP was in August. Pt is unsure if she is pregnant. Pt also reports lower abdominal pain.

## 2018-01-08 NOTE — L&D Delivery Note (Signed)
Prior to delivery fetal bradycardia into 80-90s x6 min,  FSE placed, cervix 8cm, and pt placed in hand and knees. No FHR recovery after 3-4 min, Dr. Harolyn Rutherford at bedside. Pt returned to supine position and rechecked, found to be complete and +2, maternal pushing efforts started then SVD.  Delivery Note At [phone] a viable female infant was delivered via SVD, presentation: OA. APGAR: 8, 9; weight 5'6.   Placenta status: spontaneously delivered intact with gentle cord traction. Fundus firm with massage and Pitocin.   Anesthesia: none Lacerations: none Est. Blood Loss (mL): 200 Placenta to LD Complications fetal bradycardia Cord ph 7.281   Mom to postpartum. Baby to Couplet care / Skin to Skin.    Julianne Handler, CNM 08/18/2018 9:15 AM

## 2018-07-04 ENCOUNTER — Inpatient Hospital Stay (HOSPITAL_BASED_OUTPATIENT_CLINIC_OR_DEPARTMENT_OTHER): Payer: Self-pay

## 2018-07-04 ENCOUNTER — Encounter (HOSPITAL_COMMUNITY): Payer: Self-pay

## 2018-07-04 ENCOUNTER — Inpatient Hospital Stay (HOSPITAL_COMMUNITY)
Admission: AD | Admit: 2018-07-04 | Discharge: 2018-07-04 | Disposition: A | Discharge status: Home / Self Care | Payer: Self-pay | Attending: Obstetrics and Gynecology | Admitting: Obstetrics and Gynecology

## 2018-07-04 ENCOUNTER — Other Ambulatory Visit: Payer: Self-pay

## 2018-07-04 DIAGNOSIS — O24113 Pre-existing diabetes mellitus, type 2, in pregnancy, third trimester: Secondary | ICD-10-CM

## 2018-07-04 DIAGNOSIS — O24313 Unspecified pre-existing diabetes mellitus in pregnancy, third trimester: Secondary | ICD-10-CM

## 2018-07-04 DIAGNOSIS — O09523 Supervision of elderly multigravida, third trimester: Secondary | ICD-10-CM

## 2018-07-04 DIAGNOSIS — O10013 Pre-existing essential hypertension complicating pregnancy, third trimester: Secondary | ICD-10-CM | POA: Insufficient documentation

## 2018-07-04 DIAGNOSIS — O99213 Obesity complicating pregnancy, third trimester: Secondary | ICD-10-CM

## 2018-07-04 DIAGNOSIS — O98813 Other maternal infectious and parasitic diseases complicating pregnancy, third trimester: Secondary | ICD-10-CM | POA: Insufficient documentation

## 2018-07-04 DIAGNOSIS — Z363 Encounter for antenatal screening for malformations: Secondary | ICD-10-CM

## 2018-07-04 DIAGNOSIS — O26893 Other specified pregnancy related conditions, third trimester: Secondary | ICD-10-CM

## 2018-07-04 DIAGNOSIS — O26899 Other specified pregnancy related conditions, unspecified trimester: Secondary | ICD-10-CM

## 2018-07-04 DIAGNOSIS — Z3A31 31 weeks gestation of pregnancy: Secondary | ICD-10-CM

## 2018-07-04 DIAGNOSIS — B3731 Acute candidiasis of vulva and vagina: Secondary | ICD-10-CM

## 2018-07-04 DIAGNOSIS — O10913 Unspecified pre-existing hypertension complicating pregnancy, third trimester: Secondary | ICD-10-CM

## 2018-07-04 DIAGNOSIS — B373 Candidiasis of vulva and vagina: Secondary | ICD-10-CM | POA: Insufficient documentation

## 2018-07-04 DIAGNOSIS — O0933 Supervision of pregnancy with insufficient antenatal care, third trimester: Secondary | ICD-10-CM

## 2018-07-04 DIAGNOSIS — O10919 Unspecified pre-existing hypertension complicating pregnancy, unspecified trimester: Secondary | ICD-10-CM

## 2018-07-04 DIAGNOSIS — E1165 Type 2 diabetes mellitus with hyperglycemia: Secondary | ICD-10-CM

## 2018-07-04 LAB — COMPREHENSIVE METABOLIC PANEL
ALT: 16 U/L (ref 0–44)
AST: 17 U/L (ref 15–41)
Albumin: 2.8 g/dL — ABNORMAL LOW (ref 3.5–5.0)
Alkaline Phosphatase: 50 U/L (ref 38–126)
Anion gap: 8 (ref 5–15)
BUN: 9 mg/dL (ref 6–20)
CO2: 19 mmol/L — ABNORMAL LOW (ref 22–32)
Calcium: 9.4 mg/dL (ref 8.9–10.3)
Chloride: 106 mmol/L (ref 98–111)
Creatinine, Ser: 0.63 mg/dL (ref 0.44–1.00)
GFR calc Af Amer: >60 mL/min (ref >60)
GFR calc non Af Amer: >60 mL/min (ref >60)
Glucose, Bld: 232 mg/dL — ABNORMAL HIGH (ref 70–99)
Potassium: 3.9 mmol/L (ref 3.5–5.1)
Sodium: 133 mmol/L — ABNORMAL LOW (ref 135–145)
Total Bilirubin: 0.6 mg/dL (ref 0.3–1.2)
Total Protein: 6.2 g/dL — ABNORMAL LOW (ref 6.5–8.1)

## 2018-07-04 LAB — GC/CHLAMYDIA PROBE AMP (~~LOC~~) NOT AT ARMC
Chlamydia: NEGATIVE
Neisseria Gonorrhea: NEGATIVE

## 2018-07-04 LAB — CBC WITH DIFFERENTIAL/PLATELET
Abs Immature Granulocytes: 0.03 10*3/uL (ref 0.00–0.07)
Basophils Absolute: 0 10*3/uL (ref 0.0–0.1)
Basophils Relative: 1 %
Eosinophils Absolute: 0.2 10*3/uL (ref 0.0–0.5)
Eosinophils Relative: 3 %
HCT: 36.4 % (ref 36.0–46.0)
Hemoglobin: 12.1 g/dL (ref 12.0–15.0)
Immature Granulocytes: 0 %
Lymphocytes Relative: 29 %
Lymphs Abs: 2 10*3/uL (ref 0.7–4.0)
MCH: 26.7 pg (ref 26.0–34.0)
MCHC: 33.2 g/dL (ref 30.0–36.0)
MCV: 80.4 fL (ref 80.0–100.0)
Monocytes Absolute: 0.6 10*3/uL (ref 0.1–1.0)
Monocytes Relative: 9 %
Neutro Abs: 4.1 10*3/uL (ref 1.7–7.7)
Neutrophils Relative %: 58 %
Platelets: 274 10*3/uL (ref 150–400)
RBC: 4.53 MIL/uL (ref 3.87–5.11)
RDW: 13.1 % (ref 11.5–15.5)
WBC: 7 10*3/uL (ref 4.0–10.5)
nRBC: 0 % (ref 0.0–0.2)

## 2018-07-04 LAB — URINALYSIS, ROUTINE W REFLEX MICROSCOPIC
Bilirubin Urine: NEGATIVE
Glucose, UA: >=500 mg/dL — AB
Hgb urine dipstick: NEGATIVE
Ketones, ur: 5 mg/dL — AB
Nitrite: NEGATIVE
Protein, ur: NEGATIVE mg/dL
Specific Gravity, Urine: 1.032 — ABNORMAL HIGH (ref 1.005–1.030)
WBC, UA: >50 WBC/hpf — ABNORMAL HIGH (ref 0–5)
pH: 7 (ref 5.0–8.0)

## 2018-07-04 LAB — WET PREP, GENITAL
Clue Cells Wet Prep HPF POC: NONE SEEN
Sperm: NONE SEEN
Trich, Wet Prep: NONE SEEN

## 2018-07-04 LAB — GLUCOSE, CAPILLARY: Glucose-Capillary: 181 mg/dL — ABNORMAL HIGH (ref 70–99)

## 2018-07-04 LAB — PROTEIN / CREATININE RATIO, URINE
Creatinine, Urine: 45.01 mg/dL
Protein Creatinine Ratio: 0.13 mg/mg{Cre} (ref 0.00–0.15)
Total Protein, Urine: 6 mg/dL

## 2018-07-04 LAB — POCT PREGNANCY, URINE: Preg Test, Ur: POSITIVE — AB

## 2018-07-04 MED ORDER — METFORMIN HCL 500 MG PO TABS: 500.0000 mg | ORAL_TABLET | Freq: Two times a day (BID) | ORAL | 2 refills | Status: DC

## 2018-07-04 MED ORDER — TRUEPLUS LANCETS 28G MISC: 3 refills | Status: DC

## 2018-07-04 MED ORDER — TRUE METRIX BLOOD GLUCOSE TEST VI STRP: ORAL_STRIP | 12 refills | Status: DC

## 2018-07-04 MED ORDER — TERCONAZOLE 0.8 % VA CREA: 1.0000 | TOPICAL_CREAM | Freq: Every day | VAGINAL | 0 refills | Status: DC

## 2018-07-04 MED ORDER — LABETALOL HCL 200 MG PO TABS: 200.0000 mg | ORAL_TABLET | Freq: Two times a day (BID) | ORAL | 2 refills | Status: DC

## 2018-07-04 NOTE — MAU Provider Note (Addendum)
Chief Complaint:  Vaginal Discharge   First Provider Initiated Contact with Patient 07/04/18 [phone]      HPI: [name] is a 48 y.o. G3P2001 at [facility] presents to maternity admissions reporting lower abdominal pain, white discharge and vaginal irritation and bumps.  Husband interprets over phone, unable to get IPAD interpretor for [place].  Has not had any care yet. Has proof of pregnancy letter from HD, unsure dates.Marland Kitchen LMP dates put her at about 21 weeks.   She denies LOF, vaginal bleeding, urinary symptoms, h/a, dizziness, n/v, diarrhea, constipation or fever/chills.    RN Note: Presents with white, vaginal discharge with internal and external irritation since Tuesday. Discharge sometimes appear watery. No vag bldg since Jan or Feb 2020. Pt with "painful bumps" on vulva since Tuesday as well.    Past Medical History: Past Medical History:  Diagnosis Date  . Hypertension   . Type II diabetes mellitus with complication (HCC)     Past obstetric history: OB History  Gravida Para Term Preterm AB Living  3 2 2     1   SAB TAB Ectopic Multiple Live Births          2    # Outcome Date GA Lbr Len/2nd Weight Sex Delivery Anes PTL Lv  3 Current           2 Term 12/14/16 [id number] / 00:11 2800 g M Vag-Vacuum None  LIV     Birth Comments: Abruption     Complications: Abruptio Placenta  1 Term 08/11/97 [id number] g F Vag-Spont   DEC     Complications: Malaria    Past Surgical History: Past Surgical History:  Procedure Laterality Date  . DILATION AND CURETTAGE OF UTERUS    . HYSTEROSCOPY WITH RESECTOSCOPE  2011    Family History: Family History  Problem Relation Age of Onset  . Alcohol abuse Neg Hx     Social History: Social History   Tobacco Use  . Smoking status: Never Smoker  . Smokeless tobacco: Never Used  Substance Use Topics  . Alcohol use: No  . Drug use: No    Allergies: No Known Allergies  Meds:  Medications Prior to Admission  Medication Sig Dispense  Refill Last Dose  . amLODipine (NORVASC) 10 MG tablet Take 1 tablet (10 mg total) by mouth daily. 90 tablet 1   . atorvastatin (LIPITOR) 20 MG tablet Take 1 tablet (20 mg total) by mouth daily. Do not take if breastfeeding 90 tablet 3   . Blood Glucose Monitoring Suppl (TRUE METRIX METER) w/Device KIT Use as instructed 1 kit 0   . glucose blood (TRUE METRIX BLOOD GLUCOSE TEST) test strip Use as instructed 100 each 12   . glyBURIDE (DIABETA) 5 MG tablet Take 1 tablet (5 mg total) by mouth 2 (two) times daily with a meal. 60 tablet 3   . TRUEPLUS LANCETS 28G MISC Use as instructed 100 each 3   . acetaminophen (TYLENOL) 325 MG tablet Take 650 mg by mouth every 6 (six) hours as needed.     . Prenatal Vit-Fe Fumarate-FA (MULTIVITAMIN-PRENATAL) 27-0.8 MG TABS tablet Take 1 tablet by mouth daily at 12 noon.       I have reviewed patient's Past Medical Hx, Surgical Hx, Family Hx, Social Hx, medications and allergies.   ROS:  Review of Systems  Constitutional: Negative for chills and fever.  Gastrointestinal: Positive for abdominal pain. Negative for constipation, diarrhea and nausea.  Genitourinary: Positive for vaginal discharge  and vaginal pain. Negative for vaginal bleeding.  Musculoskeletal: Negative for back pain.   Other systems negative  Physical Exam   Patient Vitals for the past 24 hrs:  BP Temp Temp src Pulse Resp SpO2 Height Weight  07/04/18 1016 (!) 116/58 - - 82 - - - -  07/04/18 1001 138/79 - - 86 - - - -  07/04/18 0946 138/80 - - 84 - - - -  07/04/18 0931 137/77 - - 82 - - - -  07/04/18 0902 (!) 149/87 - - 93 - - - -  07/04/18 0724 136/74 - - 87 16 99 % - -  07/04/18 0516 136/74 - - - - - - -  07/04/18 0501 (!) 148/85 - - - - - - -  07/04/18 0446 (!) 140/123 - - 88 - - - -  07/04/18 0431 (!) 152/78 98 F (36.7 C) Oral 84 18 100 % - -  07/04/18 0423 - - - - - - 5' 5"  (1.651 m) 98 kg   Constitutional: Well-developed, well-nourished female in no acute distress.   Cardiovascular: normal rate and rhythm Respiratory: normal effort, clear to auscultation bilaterally GI: Abd soft, non-tender, gravid appropriate for gestational age.   No rebound or guarding.  Abdomen is obese, very tight, almost taut.  Almost like an abdomen with acites.   Difficult to palpate fundus, ?21cm.   MS: Extremities nontender, no edema, normal ROM Neurologic: Alert and oriented x 4.  GU: Neg CVAT.  PELVIC EXAM: Cervix pink, visually closed, without lesion, scant white creamy discharge, vaginal walls and external genitalia normal, but there is excoriation bilateral labia minora, does not appear herpetic, more like excoriation.   Bimanual exam: Cervix firm, posterior, neg CMT, uterus nontender, Fundal Height consistent with dates, adnexa without tenderness, enlargement, or mass   Very difficult exam due to habitus.   FHT: 135    Labs: Results for orders placed or performed during the hospital encounter of 07/04/18 (from the past 24 hour(s))  Urinalysis, Routine w reflex microscopic     Status: Abnormal   Collection Time: 07/04/18  4:33 AM  Result Value Ref Range   Color, Urine STRAW (A) YELLOW   APPearance CLEAR CLEAR   Specific Gravity, Urine 1.032 (H) 1.005 - 1.030   pH 7.0 5.0 - 8.0   Glucose, UA >=500 (A) NEGATIVE mg/dL   Hgb urine dipstick NEGATIVE NEGATIVE   Bilirubin Urine NEGATIVE NEGATIVE   Ketones, ur 5 (A) NEGATIVE mg/dL   Protein, ur NEGATIVE NEGATIVE mg/dL   Nitrite NEGATIVE NEGATIVE   Leukocytes,Ua TRACE (A) NEGATIVE   RBC / HPF 0-5 0 - 5 RBC/hpf   WBC, UA >50 (H) 0 - 5 WBC/hpf   Bacteria, UA RARE (A) NONE SEEN   Squamous Epithelial / LPF 6-10 0 - 5   Ca Oxalate Crys, UA PRESENT   Wet prep, genital     Status: Abnormal   Collection Time: 07/04/18  4:40 AM  Result Value Ref Range   Yeast Wet Prep HPF POC PRESENT (A) NONE SEEN   Trich, Wet Prep NONE SEEN NONE SEEN   Clue Cells Wet Prep HPF POC NONE SEEN NONE SEEN   WBC, Wet Prep HPF POC MANY (A) NONE  SEEN   Sperm NONE SEEN   CBC with Differential/Platelet     Status: None   Collection Time: 07/04/18  4:57 AM  Result Value Ref Range   WBC 7.0 4.0 - 10.5 K/uL   RBC 4.53 3.87 -  5.11 MIL/uL   Hemoglobin 12.1 12.0 - 15.0 g/dL   HCT 36.4 36.0 - 46.0 %   MCV 80.4 80.0 - 100.0 fL   MCH 26.7 26.0 - 34.0 pg   MCHC 33.2 30.0 - 36.0 g/dL   RDW 13.1 11.5 - 15.5 %   Platelets 274 150 - 400 K/uL   nRBC 0.0 0.0 - 0.2 %   Neutrophils Relative % 58 %   Neutro Abs 4.1 1.7 - 7.7 K/uL   Lymphocytes Relative 29 %   Lymphs Abs 2.0 0.7 - 4.0 K/uL   Monocytes Relative 9 %   Monocytes Absolute 0.6 0.1 - 1.0 K/uL   Eosinophils Relative 3 %   Eosinophils Absolute 0.2 0.0 - 0.5 K/uL   Basophils Relative 1 %   Basophils Absolute 0.0 0.0 - 0.1 K/uL   Immature Granulocytes 0 %   Abs Immature Granulocytes 0.03 0.00 - 0.07 K/uL  Comprehensive metabolic panel     Status: Abnormal   Collection Time: 07/04/18  4:57 AM  Result Value Ref Range   Sodium 133 (L) 135 - 145 mmol/L   Potassium 3.9 3.5 - 5.1 mmol/L   Chloride 106 98 - 111 mmol/L   CO2 19 (L) 22 - 32 mmol/L   Glucose, Bld 232 (H) 70 - 99 mg/dL   BUN 9 6 - 20 mg/dL   Creatinine, Ser 0.63 0.44 - 1.00 mg/dL   Calcium 9.4 8.9 - 10.3 mg/dL   Total Protein 6.2 (L) 6.5 - 8.1 g/dL   Albumin 2.8 (L) 3.5 - 5.0 g/dL   AST 17 15 - 41 U/L   ALT 16 0 - 44 U/L   Alkaline Phosphatase 50 38 - 126 U/L   Total Bilirubin 0.6 0.3 - 1.2 mg/dL   GFR calc non Af Amer >60 >60 mL/min   GFR calc Af Amer >60 >60 mL/min   Anion gap 8 5 - 15  Pregnancy, urine POC     Status: Abnormal   Collection Time: 07/04/18  7:00 AM  Result Value Ref Range   Preg Test, Ur POSITIVE (A) NEGATIVE  Protein / creatinine ratio, urine     Status: None   Collection Time: 07/04/18  7:09 AM  Result Value Ref Range   Creatinine, Urine 45.01 mg/dL   Total Protein, Urine 6 mg/dL   Protein Creatinine Ratio 0.13 0.00 - 0.15 mg/mg[Cre]  Glucose, capillary     Status: Abnormal   Collection  Time: 07/04/18  9:46 AM  Result Value Ref Range   Glucose-Capillary 181 (H) 70 - 99 mg/dL     Imaging:  Informal results:  Fetus measures 31 weeks, right posterior placenta, normal AFI   MAU Course/MDM: I have ordered labs and reviewed results. CMET and CBC are normal                                                                       Yeast is seen on wet prep. US showed fetus is much further along than her dates indicated.  Assessment: [place] intrauterine pregnancy at 89w6dVaginal irritation, likely yeast vaginitis Chronic hypertension  Plan: Care turned over to EJorje GuildNP Will complete Preeclampsia labs by adding Protein/Creat Ratio (CBC and CMET normal) .  MLelan Pons  Lavella Lemons, MSN Certified Nurse-Midwife 07/04/2018 4:51 AM       NST:  Baseline: 135 bpm, Variability: Good {> 6 bpm), Accelerations: Reactive, Decelerations: Absent and no contractions   Discussed labs & pt history with Dr. Rip Harbour. Will d/c home on metformin 500 mg BID, labetalol 200 mg BID, & baby ASA daily.  Discussed plan with patient who agrees & verbalizes understanding.  Message sent to [place] for appts asap with MD, diabetes educator, & antenatal testing.   A: 1. Chronic hypertension affecting pregnancy   2. Abdominal pain affecting pregnancy, antepartum   3. [redacted] weeks gestation of pregnancy   4. Vaginal yeast infection   5. Pregnancy with type 2 diabetes mellitus in third trimester   6. Uncontrolled type 2 diabetes mellitus with hyperglycemia ([place])    P: Discharge home Meds sent to pharmacy Pt states she has glucometer at home - sent refill for strips & lancets Msg sent to office for follow up Discussed reasons to return to MAU Hemoglobin A1C pending  Jorje Guild, NP

## 2018-07-04 NOTE — MAU Note (Addendum)
Presents with white, vaginal discharge with internal and external irritation since Tuesday. Discharge sometimes appear watery. No vag bldg since Jan or Feb 2020. Pt with "painful bumps" on vulva since Tuesday as well.   Gilmer Mor RN

## 2018-07-04 NOTE — Discharge Instructions (Signed)
Type 1 or Type 2 Diabetes Mellitus During Pregnancy, Self Care When you have type 1 or type 2 diabetes (diabetes mellitus), you must keep your blood sugar (glucose) in a healthy range. You can do this with:  Nutrition.  Exercise.  Lifestyle changes.  Insulin or medicines, if needed.  Support from your doctors and others. If diabetes is treated, it is unlikely to cause problems for the mother or baby. If it is not treated, it may cause problems that can be harmful to the mother and baby. How to stay aware of blood sugar   Check your blood sugar every day, as often as told.  Call your doctor if your blood sugar is above your goal numbers for 2 tests in a row.  Have your A1c (hemoglobin A1c) level checked at least two times a year. Have it checked more often if your doctor tells you to do that. Your doctor will set personal treatment goals for you. In general, you should have these blood sugar levels:  After not eating for a long time (fasting): 95 mg/dL (5.3 mmol/L).  After meals (postprandial): ? One hour after a meal: at or below 140 mg/dL (7.8 mmol/L). ? Two hours after a meal: at or below 120 mg/dL (6.7 mmol/L).  A1c level: 6-6.5%. How to manage high and low blood sugar Signs of high blood sugar High blood sugar is called hyperglycemia. Know the early signs of high blood sugar. Signs may include:  Feeling: ? Thirsty. ? Hungry. ? Very tired.  Needing to pee (urinate) more than usual.  Blurry vision. Signs of low blood sugar Low blood sugar is called hypoglycemia. This is when blood sugar is at or below 70 mg/dL (3.9 mmol/L). Signs may include:  Feeling: ? Hungry. ? Worried or nervous (anxious). ? Sweaty and clammy. ? Confused. ? Dizzy. ? Sleepy. ? Sick to your stomach (nauseous).  Having: ? A fast heartbeat. ? A headache. ? A change in your vision. ? Tingling or no feeling (numbness) around your mouth, lips, or tongue. ? Jerky movements that you cannot  control (seizure).  Having trouble with: ? Moving (coordination). ? Sleeping. ? Passing out (fainting). ? Getting upset easily (irritability). Treating low blood sugar To treat low blood sugar, eat or drink something sugary right away. If you can think clearly and swallow safely, follow the 15:15 rule:  Take 15 grams of a fast-acting carb (carbohydrate). Some fast-acting carbs are: ? 1 tube of glucose gel. ? 3 sugar tablets (glucose pills). ? 6-8 pieces of hard candy. ? 4 oz (120 mL) of fruit juice. ? 4 oz (120 mL) of regular (not diet) soda.  Check your blood sugar 15 minutes after you take the carb.  If your blood sugar is still at or below 70 mg/dL (3.9 mmol/L), take 15 grams of a carb again.  If your blood sugar does not go above 70 mg/dL (3.9 mmol/L) after 3 tries, get help right away.  After your blood sugar goes back to normal, eat a meal or a snack within 1 hour. Treating very low blood sugar If your blood sugar is at or below 54 mg/dL (3 mmol/L), you have very low blood sugar (severe hypoglycemia). This is an emergency. Do not wait to see if the symptoms will go away. Get medical help right away. Call your local emergency services (911 in the [place] If you have very low blood sugar and you cannot eat or drink, you may need a glucagon shot (injection). A  family member or friend should learn how to check your blood sugar and how to give you a glucagon shot. Ask your doctor if you need to have a glucagon shot kit at home. Follow these instructions at home: Medicine  Take insulin and diabetes medicines as told.  If your doctor says you should take more or less insulin and medicines, do this exactly as told.  Do not run out of insulin or medicines. Having diabetes can put you at risk for other long-term conditions. These include heart disease and kidney disease. Your doctor may prescribe medicines to prevent these problems. Food   Make healthy food choices. These  include: ? Chicken, fish, egg whites, and beans. ? Oats, whole wheat, bulgur, brown rice, quinoa, and millet. ? Fresh fruits and vegetables. ? Low-fat dairy products. ? Nuts, avocado, olive oil, and canola oil.  Meet with a food specialist (dietitian). He or she can help you make an eating plan that is right for you.  Follow instructions from your doctor about what you cannot eat or drink.  Drink enough fluid to keep your pee (urine) pale yellow.  Eat healthy snacks between healthy meals.  Keep track of the carbs you eat. Do this by reading food labels and learning food serving sizes.  Follow your sick day plan when you cannot eat or drink normally. Make this plan with your doctor so it is ready to use. Activity  Exercise for 30 minutes or more a day during your pregnancy or as much as told by your doctor.  Talk with your doctor before you start a new exercise or activity. Your doctor may need to tell you to change: ? How much insulin or medicines you take. ? How much food you eat. Lifestyle  Do not drink alcohol.  Do not use any tobacco products, such as cigarettes, chewing tobacco, and e-cigarettes. If you need help quitting, ask your doctor.  Learn how to deal with stress. If you need help with this, ask your doctor. Body care  Stay up to date with your shots (immunizations).  Get an eye exam during your first trimester.  Check your skin and feet every day. Check for cuts, bruises, redness, blisters, or sores.  Get regular foot exams as told by your doctor.  Brush your teeth and gums two times a day. Floss one or more times a day.  Go to the dentist one or more times every 6 months.  Stay at a healthy weight during your pregnancy. General instructions  Take over-the-counter and prescription medicines only as told by your doctor.  Talk with your doctor about your risk for high blood pressure during pregnancy (preeclampsia or eclampsia).  Share your diabetes care  plan with: ? Your work or school. ? People you live with.  Check your pee for ketones: ? When you are sick. ? As told by your doctor.  Carry a card or wear jewelry that says that you have diabetes.  Keep all follow-up visits with your doctor. This is important. Questions to ask your doctor  Do I need to meet with a diabetes educator?  Where can I find a support group for people with diabetes? Where to find more information To learn more about diabetes, visit:  American Diabetes Association: www.diabetes.org  American Association of Diabetes Educators (AADE): www.diabeteseducator.org Summary  When you have type 1 or type 2 diabetes (diabetes mellitus), you must keep your blood sugar (glucose) in a healthy range.  Check your blood sugar every  day, as often as told.  Take insulin and diabetes medicines as told.  Keep all follow-up visits as told by your doctor. This is important. This information is not intended to replace advice given to you by your health care provider. Make sure you discuss any questions you have with your health care provider. Document Released: 04/18/2015 Document Revised: 08/14/2016 Document Reviewed: 01/28/2015 Elsevier Interactive Patient Education  2019 Elsevier Inc.     Hypertension During Pregnancy  Hypertension is also called high blood pressure. High blood pressure means that the force of your blood moving in your body is too strong. When you are pregnant, this condition should be watched carefully. It can cause problems for you and your baby. Follow these instructions at home: Eating and drinking   Drink enough fluid to keep your pee (urine) pale yellow.  Avoid caffeine. Lifestyle  Do not use any products that contain nicotine or tobacco, such as cigarettes and e-cigarettes. If you need help quitting, ask your doctor.  Do not use alcohol or drugs.  Avoid stress.  Rest and get plenty of sleep. General instructions  Take  over-the-counter and prescription medicines only as told by your doctor.  While lying down, lie on your left side. This keeps pressure off your major blood vessels.  While sitting or lying down, raise (elevate) your feet. Try putting some pillows under your lower legs.  Exercise regularly. Ask your doctor what kinds of exercise are best for you.  Keep all prenatal and follow-up visits as told by your doctor. This is important. Contact a doctor if:  You have symptoms that your doctor told you to watch for, such as: ? Throwing up (vomiting). ? Feeling sick to your stomach (nausea). ? Headache. Get help right away if you have:  Very bad belly pain that does not get better with treatment.  A very bad headache that does not get better.  Throwing up that does not get better with treatment.  Sudden, fast weight gain.  Sudden swelling in your hands, ankles, or face.  Bleeding from your vagina.  Blood in your pee.  Fewer movements from your baby than usual.  Blurry vision.  Double vision.  Muscle twitching.  Sudden muscle tightening (spasms).  Trouble breathing.  Blue fingernails or lips. Summary  Hypertension is also called high blood pressure. High blood pressure means that the force of your blood moving in your body is too strong.  When you are pregnant, this condition should be watched carefully. It can cause problems for you and your baby.  Get help right away if you have symptoms that your doctor told you to watch for. This information is not intended to replace advice given to you by your health care provider. Make sure you discuss any questions you have with your health care provider. Document Released: 01/27/2010 Document Revised: 12/11/2016 Document Reviewed: 09/06/2015 Elsevier Interactive Patient Education  2019 [employer].     Fetal Movement Counts Patient Name: ________________________________________________ Patient Due Date:  ____________________ What is a fetal movement count?  A fetal movement count is the number of times that you feel your baby move during a certain amount of time. This may also be called a fetal kick count. A fetal movement count is recommended for every pregnant woman. You may be asked to start counting fetal movements as early as week 28 of your pregnancy. Pay attention to when your baby is most active. You may notice your baby's sleep and wake cycles. You may also  notice things that make your baby move more. You should do a fetal movement count:  When your baby is normally most active.  At the same time each day. A good time to count movements is while you are resting, after having something to eat and drink. How do I count fetal movements? 1. Find a quiet, comfortable area. Sit, or lie down on your side. 2. Write down the date, the start time and stop time, and the number of movements that you felt between those two times. Take this information with you to your health care visits. 3. For 2 hours, count kicks, flutters, swishes, rolls, and jabs. You should feel at least 10 movements during 2 hours. 4. You may stop counting after you have felt 10 movements. 5. If you do not feel 10 movements in 2 hours, have something to eat and drink. Then, keep resting and counting for 1 hour. If you feel at least 4 movements during that hour, you may stop counting. Contact a health care provider if:  You feel fewer than 4 movements in 2 hours.  Your baby is not moving like he or she usually does. Date: ____________ Start time: ____________ Stop time: ____________ Movements: ____________ Date: ____________ Start time: ____________ Stop time: ____________ Movements: ____________ Date: ____________ Start time: ____________ Stop time: ____________ Movements: ____________ Date: ____________ Start time: ____________ Stop time: ____________ Movements: ____________ Date: ____________ Start time: ____________  Stop time: ____________ Movements: ____________ Date: ____________ Start time: ____________ Stop time: ____________ Movements: ____________ Date: ____________ Start time: ____________ Stop time: ____________ Movements: ____________ Date: ____________ Start time: ____________ Stop time: ____________ Movements: ____________ Date: ____________ Start time: ____________ Stop time: ____________ Movements: ____________ This information is not intended to replace advice given to you by your health care provider. Make sure you discuss any questions you have with your health care provider. Document Released: 01/24/2006 Document Revised: 01/14/2018 Document Reviewed: 02/03/2015 Elsevier Patient Education  2020 [employer].

## 2018-07-05 LAB — CULTURE, OB URINE: Culture: 60000 — AB

## 2018-07-05 LAB — HEMOGLOBIN A1C
Hgb A1c MFr Bld: 9.1 % — ABNORMAL HIGH (ref 4.8–5.6)
Mean Plasma Glucose: 214 mg/dL

## 2018-07-06 ENCOUNTER — Other Ambulatory Visit: Payer: Self-pay | Admitting: Advanced Practice Midwife

## 2018-07-06 ENCOUNTER — Encounter: Payer: Self-pay | Admitting: Advanced Practice Midwife

## 2018-07-06 ENCOUNTER — Telehealth: Payer: Self-pay | Admitting: Advanced Practice Midwife

## 2018-07-06 DIAGNOSIS — O0990 Supervision of high risk pregnancy, unspecified, unspecified trimester: Secondary | ICD-10-CM | POA: Insufficient documentation

## 2018-07-06 DIAGNOSIS — B951 Streptococcus, group B, as the cause of diseases classified elsewhere: Secondary | ICD-10-CM

## 2018-07-06 MED ORDER — AMOXICILLIN 500 MG PO CAPS: 500.0000 mg | ORAL_CAPSULE | Freq: Three times a day (TID) | ORAL | 2 refills | Status: DC

## 2018-07-06 NOTE — Telephone Encounter (Signed)
Patient contacted via phone at home. Made aware of UTI diagnosis and need for abx. Patient verbalizes understanding.  Mallie Snooks, CNM 07/06/18 3:40 PM

## 2018-07-06 NOTE — Progress Notes (Signed)
+   GBS in urine culture from [facility] visit 07/04/18. Rx to pharmacy and problem list updated. Phone call attempted. No answer, no voicemail, no MyChart.  Mallie Snooks, CNM 07/06/18 8:07 AM

## 2018-07-07 ENCOUNTER — Telehealth: Payer: Self-pay | Admitting: *Deleted

## 2018-07-07 LAB — HSV CULTURE AND TYPING

## 2018-07-07 MED FILL — AMOXICILLIN 500 MG CAPSULE: 500 | 7 days supply | Qty: 21 | Fill #0

## 2018-07-07 NOTE — Telephone Encounter (Signed)
Attempted to call patient to ask her about any symptoms, and to wear her mask the whole visit covering her nose and mouth. Also, to sanitize her hands upon arriving as well as no visitors.

## 2018-07-08 ENCOUNTER — Encounter: Payer: Self-pay | Attending: Obstetrics and Gynecology | Admitting: *Deleted

## 2018-07-08 ENCOUNTER — Ambulatory Visit: Payer: Self-pay | Admitting: *Deleted

## 2018-07-08 ENCOUNTER — Other Ambulatory Visit: Payer: Self-pay

## 2018-07-08 DIAGNOSIS — O24913 Unspecified diabetes mellitus in pregnancy, third trimester: Secondary | ICD-10-CM | POA: Insufficient documentation

## 2018-07-08 DIAGNOSIS — O24414 Gestational diabetes mellitus in pregnancy, insulin controlled: Secondary | ICD-10-CM

## 2018-07-08 DIAGNOSIS — Z713 Dietary counseling and surveillance: Secondary | ICD-10-CM | POA: Insufficient documentation

## 2018-07-08 MED ORDER — INSULIN NPH (HUMAN) (ISOPHANE) 100 UNIT/ML ~~LOC~~ SUSP: 10.0000 [IU] | Freq: Two times a day (BID) | SUBCUTANEOUS | 3 refills | Status: DC

## 2018-07-08 MED ORDER — "INSULIN SYRINGE-NEEDLE U-100 30G X 5/16"" 0.5 ML MISC": 1.0000 | Freq: Two times a day (BID) | 3 refills | Status: DC

## 2018-07-08 NOTE — Progress Notes (Signed)
Patient was seen on 07/08/2018 for type 2 Diabetes and pregnancy self-management. EDD 08/30/2018. She speaks [place], we used phone interpretor, Ameni, who has worked with this patient in the past too.  Patient states history of this diabetes for several years.  Diet history obtained. Patient eats good variety of all food groups and beverages include water and occasionally apple juice.  She states some training on Carbohydrate Counting a couple of years ago but is not familiar with it today. Patient is currently on Metformin diabetes medications.  The following learning objectives were met by the patient :   Demonstrates ability to create a balanced meal plan  Demonstrates carbohydrate counting   States when to check blood glucose levels  Demonstrates proper blood glucose monitoring techniques  States the effect of stress and exercise on blood glucose levels  States the importance of limiting caffeine and abstaining from alcohol and smoking  Plan:   Aim for 3 Carb Choices per meal (45 grams) +/- 1 either way   Aim for 1-2 Carbs per snack  Continue with your activity level by walking or other activity daily as tolerated  Begin checking BG before breakfast and 2 hours after first bite of breakfast, lunch and dinner as directed by MD   Bring Log Book/Sheet and meter to every medical appointment   Take medication as directed by MD  Patient already has a meter:  And is testing pre breakfast and 2 hours each meal as directed by MD Review of Log Book shows: FBG all elevated in range of 141-183 mg/dl Post meal BG all elevated in range of 131-246 mg/dl. Patient started on Metformin @ 500 mg BID  I spoke with Dr. Fulton Reek who wants patient to start on NPH @ 10 units BID. Orders for ReliOn NPH and syringes sent to [place]. I reviewed insulin administration with patient and she expressed understanding with return demonstration.   Patient instructed to monitor glucose levels: FBS: 60 -  95 mg/dl 2 hour: <120 mg/dl  Patient received the following handouts:  Nutrition Diabetes and Pregnancy  Carbohydrate Counting List  Patient will be seen for follow-up in 1 month preferably when she comes for her follow up visit with MD and as needed. She needs to combine visits instead of seeing providers on different days.

## 2018-07-14 ENCOUNTER — Other Ambulatory Visit: Payer: Self-pay

## 2018-07-14 ENCOUNTER — Telehealth: Payer: Self-pay | Admitting: Obstetrics & Gynecology

## 2018-07-14 ENCOUNTER — Telehealth: Payer: Self-pay | Admitting: Family Medicine

## 2018-07-14 ENCOUNTER — Encounter: Payer: Self-pay | Admitting: Obstetrics & Gynecology

## 2018-07-14 NOTE — Telephone Encounter (Signed)
Called the patient in regards to missed appointment. Left a detailed voicemail of calling our office to reschedule. Also mailing the patient a missed appointment letter.   Interrupter ID: [id number]

## 2018-07-14 NOTE — Telephone Encounter (Signed)
Spoke with patient used interpreter [phone]. Patient stated she could only come in the mornings because she has a small baby at home.

## 2018-07-16 ENCOUNTER — Telehealth: Payer: Self-pay | Admitting: Internal Medicine

## 2018-07-16 ENCOUNTER — Encounter: Payer: Self-pay | Admitting: Obstetrics & Gynecology

## 2018-07-16 ENCOUNTER — Other Ambulatory Visit: Payer: Self-pay

## 2018-07-16 NOTE — Telephone Encounter (Signed)
Spoke with patient w/ [place] interpreter ID# [phone] about her appointment on 7/9 @ 8:15. Patient instructed to wear a face mask for the entire appointment and no visitors are allowed with her. Patient screened for covid symptoms and denied having any.

## 2018-07-17 ENCOUNTER — Other Ambulatory Visit: Payer: Self-pay

## 2018-07-17 ENCOUNTER — Ambulatory Visit: Payer: Self-pay

## 2018-07-17 ENCOUNTER — Encounter: Payer: Self-pay | Admitting: Internal Medicine

## 2018-07-17 ENCOUNTER — Ambulatory Visit (INDEPENDENT_AMBULATORY_CARE_PROVIDER_SITE_OTHER): Payer: Self-pay | Admitting: *Deleted

## 2018-07-17 ENCOUNTER — Ambulatory Visit (INDEPENDENT_AMBULATORY_CARE_PROVIDER_SITE_OTHER): Payer: Self-pay | Admitting: Internal Medicine

## 2018-07-17 VITALS — BP 138/84 | HR 87 | Wt 212.4 lb

## 2018-07-17 DIAGNOSIS — Z3A33 33 weeks gestation of pregnancy: Secondary | ICD-10-CM

## 2018-07-17 DIAGNOSIS — O24119 Pre-existing diabetes mellitus, type 2, in pregnancy, unspecified trimester: Secondary | ICD-10-CM

## 2018-07-17 DIAGNOSIS — O09529 Supervision of elderly multigravida, unspecified trimester: Secondary | ICD-10-CM | POA: Insufficient documentation

## 2018-07-17 DIAGNOSIS — O09293 Supervision of pregnancy with other poor reproductive or obstetric history, third trimester: Secondary | ICD-10-CM

## 2018-07-17 DIAGNOSIS — O09523 Supervision of elderly multigravida, third trimester: Secondary | ICD-10-CM

## 2018-07-17 DIAGNOSIS — O10913 Unspecified pre-existing hypertension complicating pregnancy, third trimester: Secondary | ICD-10-CM

## 2018-07-17 DIAGNOSIS — B951 Streptococcus, group B, as the cause of diseases classified elsewhere: Secondary | ICD-10-CM

## 2018-07-17 DIAGNOSIS — O24113 Pre-existing diabetes mellitus, type 2, in pregnancy, third trimester: Secondary | ICD-10-CM

## 2018-07-17 DIAGNOSIS — O10919 Unspecified pre-existing hypertension complicating pregnancy, unspecified trimester: Secondary | ICD-10-CM | POA: Insufficient documentation

## 2018-07-17 DIAGNOSIS — O99820 Streptococcus B carrier state complicating pregnancy: Secondary | ICD-10-CM

## 2018-07-17 DIAGNOSIS — O0993 Supervision of high risk pregnancy, unspecified, third trimester: Secondary | ICD-10-CM

## 2018-07-17 DIAGNOSIS — O0990 Supervision of high risk pregnancy, unspecified, unspecified trimester: Secondary | ICD-10-CM

## 2018-07-17 MED ORDER — METFORMIN HCL 1000 MG PO TABS: 1000.0000 mg | ORAL_TABLET | Freq: Two times a day (BID) | ORAL | 3 refills | Status: DC

## 2018-07-17 MED ORDER — AMOXICILLIN 500 MG PO CAPS: 500.0000 mg | ORAL_CAPSULE | Freq: Three times a day (TID) | ORAL | 2 refills | Status: DC

## 2018-07-17 NOTE — Progress Notes (Signed)
Interpreter [name] used for encounter via phone.   Pt informed that the ultrasound is considered a limited OB ultrasound and is not intended to be a complete ultrasound exam.  Patient also informed that the ultrasound is not being completed with the intent of assessing for fetal or placental anomalies or any pelvic abnormalities.  Explained that the purpose of today's ultrasound is to assess for presentation, BPP and amniotic fluid volume.  Patient acknowledges the purpose of the exam and the limitations of the study.

## 2018-07-17 NOTE — Progress Notes (Signed)
NST:  Baseline: 135 bpm, Variability: Good {> 6 bpm), Accelerations: Reactive and Decelerations: Absent   

## 2018-07-17 NOTE — Progress Notes (Signed)
Pt is self pay, has applied for Pregnancy Medicaid, still waiting.Advised to contact them to check status.

## 2018-07-17 NOTE — Progress Notes (Signed)
Subjective:   [name] is a 48 y.o. G3P2001 at 82w5dby midtrimester ultrasound being seen today for her first obstetrical visit.  Her obstetrical history is significant for advanced maternal age and T2DM, cHTN. . Patient does intend to breast feed. Pregnancy history fully reviewed.  Patient reports no complaints.  HISTORY: OB History  Gravida Para Term Preterm AB Living  3 2 2  0 0 1  SAB TAB Ectopic Multiple Live Births  0 0 0 0 2    # Outcome Date GA Lbr Len/2nd Weight Sex Delivery Anes PTL Lv  3 Current           2 Term 12/14/16 [id number] / 00:11 2.8 kg M Vag-Vacuum None  LIV     Birth Comments: Abruption     Complications: Abruptio Placenta     Name: Fray,BOY Elgie     Apgar1: 3  Apgar5: 7  1 Term 08/11/97 [id number].175 kg F Vag-Spont   DEC     Complications: Malaria     Name: Samira   Past Medical History:  Diagnosis Date  . Hypertension   . [facility]  Past Surgical History:  Procedure Laterality Date  . DILATION AND CURETTAGE OF UTERUS    . HYSTEROSCOPY WITH RESECTOSCOPE  2011   Family History  Problem Relation Age of Onset  . Alcohol abuse Neg Hx    Social History   Tobacco Use  . Smoking status: Never Smoker  . Smokeless tobacco: Never Used  Substance Use Topics  . Alcohol use: No  . Drug use: No   No Known Allergies Current Outpatient Medications on File Prior to Visit  Medication Sig Dispense Refill  . acetaminophen (TYLENOL) 325 MG tablet Take 650 mg by mouth every 6 (six) hours as needed.    . Blood Glucose Monitoring Suppl (TRUE METRIX METER) w/Device KIT Use as instructed 1 kit 0  . glucose blood (TRUE METRIX BLOOD GLUCOSE TEST) test strip Use as instructed 100 each 12  . insulin NPH Human (NOVOLIN N) 100 UNIT/ML injection Inject 0.1 mLs (10 Units total) into the skin 2 (two) times a day. Inject 0.1 ml (10 units)  at 9 am and 0.1 (10 Units) ml at 9 pm qhs 10 mL 3  . Insulin Syringe-Needle U-100  (SAFETY INSULIN SYRINGES) 30G X 5/16" 0.5 ML MISC 1 Syringe by Does not apply route 2 (two) times a day. 100 each 3  . labetalol (NORMODYNE) 200 MG tablet Take 1 tablet (200 mg total) by mouth 2 (two) times daily. 60 tablet 2  . Prenatal Vit-Fe Fumarate-FA (MULTIVITAMIN-PRENATAL) 27-0.8 MG TABS tablet Take 1 tablet by mouth daily at 12 noon.    . tMarland Kitchenrconazole (TERAZOL 3) 0.8 % vaginal cream Place 1 applicator vaginally at bedtime. 20 g 0  . TRUEplus Lancets 28G MISC Use to check blood sugar 4 times per day (fasting & 2 hours after meals) 100 each 3   No current facility-administered medications on file prior to visit.     Exam   Vitals:   07/17/18 0840  BP: 138/84  Pulse: 87  Weight: 96.3 kg   Fetal Heart Rate (bpm): NST  Uterus:     Pelvic Exam: Perineum: no hemorrhoids, normal perineum   Vulva: normal external genitalia, no lesions   Vagina:  normal mucosa, normal discharge   Cervix: no lesions and normal, pap smear done.    Adnexa: normal adnexa and no  mass, fullness, tenderness   Bony Pelvis: average  System: General: well-developed, well-nourished female in no acute distress   Breast:  normal appearance, no masses or tenderness   Skin: normal coloration and turgor, no rashes   Neurologic: oriented, normal, negative, normal mood   Extremities: normal strength, tone, and muscle mass, ROM of all joints is normal   HEENT PERRLA, extraocular movement intact and sclera clear, anicteric   Mouth/Teeth mucous membranes moist, pharynx normal without lesions and dental hygiene good   Neck supple and no masses   Cardiovascular: regular rate and rhythm   Respiratory:  no respiratory distress, normal breath sounds   Abdomen: soft, non-tender; bowel sounds normal; no masses,  no organomegaly     Assessment:   Pregnancy: G3P2001 Patient Active Problem List   Diagnosis Date Noted  . Chronic hypertension in pregnancy 07/17/2018  . Type 2 diabetes mellitus affecting pregnancy,  antepartum 07/17/2018  . Advanced maternal age in multigravida 07/17/2018  . Supervision of high risk pregnancy, antepartum 07/06/2018  . Group beta Strep positive 07/06/2018  . Prior pregnancy with fetal demise and current pregnancy 11/16/2016     Plan:  1. Supervision of high risk pregnancy, antepartum -Needs weekly testing until delivery  - Tdap vaccine greater than or equal to 7yo IM -Needs OB panel labs drawn today  -Too late for genetic screening  -Anatomy scan reviewed   2. Chronic hypertension in pregnancy Currently taking Labetalol 200 mg BID. No symptoms of Pre-E. PIH labs WNL. BP normotensive. Will not adjust medication at present. Patient does not yet have BP cuff for monitoring at home, has applied for pregnancy medicaid and is waiting for approval.   3. Type 2 diabetes mellitus affecting pregnancy, antepartum Takes Metformin 500 mg BID. Takes Novolin 10u BID. Reviewed CBGs. Fasting CBGs 99-105. Postprandial CBGs elevated, average 170s. Will increase Novolin to 12u in AM due to elevated postprandial CBGs. Will leave Novolin at 10u in PM due to close goal of fasting CBGs. Will also increase Metformin to 1000 mg BID due to overall elevated CBGs throughout the day. Patient denies symptoms of GI intolerance with current dose of metformin. Needs appointment with nutrition; message sent with check out note.   73. Multigravida of advanced maternal age in third trimester Continue testing.   5. Group beta Strep positive GBS+ in urine culture from prior visit. Patient did not pick up antibiotic from her pharmacy. New Rx sent. Patient will need TOC at next visit.    The nature of [facility] with multiple MDs and other Advanced Practice Providers was explained to patient; also emphasized that residents, students are part of our team. Routine obstetric precautions reviewed. Return in about 1 week (around 07/24/2018) for Weekly NST/BPP and HOB; return  in 2 weeks for 36 week swabs .   Melina Schools 8:04 PM 07/26/18

## 2018-07-18 LAB — OBSTETRIC PANEL, INCLUDING HIV
Antibody Screen: NEGATIVE
Basophils Absolute: 0.1 10*3/uL (ref 0.0–0.2)
Basos: 1 %
EOS (ABSOLUTE): 0.3 10*3/uL (ref 0.0–0.4)
Eos: 4 %
HIV Screen 4th Generation wRfx: NONREACTIVE
Hematocrit: 39.9 % (ref 34.0–46.6)
Hemoglobin: 13.3 g/dL (ref 11.1–15.9)
Hepatitis B Surface Ag: NEGATIVE
Immature Grans (Abs): 0 10*3/uL (ref 0.0–0.1)
Immature Granulocytes: 1 %
Lymphocytes Absolute: 2.2 10*3/uL (ref 0.7–3.1)
Lymphs: 31 %
MCH: 26.2 pg — ABNORMAL LOW (ref 26.6–33.0)
MCHC: 33.3 g/dL (ref 31.5–35.7)
MCV: 79 fL (ref 79–97)
Monocytes Absolute: 0.6 10*3/uL (ref 0.1–0.9)
Monocytes: 9 %
Neutrophils Absolute: 3.8 10*3/uL (ref 1.4–7.0)
Neutrophils: 54 %
Platelets: 330 10*3/uL (ref 150–450)
RBC: 5.07 x10E6/uL (ref 3.77–5.28)
RDW: 13.6 % (ref 11.7–15.4)
RPR Ser Ql: NONREACTIVE
Rh Factor: POSITIVE
Rubella Antibodies, IGG: 1.56 index (ref >0.99)
WBC: 6.9 10*3/uL (ref 3.4–10.8)

## 2018-07-24 ENCOUNTER — Ambulatory Visit: Payer: Self-pay

## 2018-07-24 ENCOUNTER — Ambulatory Visit (INDEPENDENT_AMBULATORY_CARE_PROVIDER_SITE_OTHER): Payer: Self-pay | Admitting: *Deleted

## 2018-07-24 ENCOUNTER — Other Ambulatory Visit: Payer: Self-pay

## 2018-07-24 VITALS — BP 146/79 | HR 88 | Wt 216.4 lb

## 2018-07-24 DIAGNOSIS — O10913 Unspecified pre-existing hypertension complicating pregnancy, third trimester: Secondary | ICD-10-CM

## 2018-07-24 DIAGNOSIS — O24119 Pre-existing diabetes mellitus, type 2, in pregnancy, unspecified trimester: Secondary | ICD-10-CM

## 2018-07-24 DIAGNOSIS — O10919 Unspecified pre-existing hypertension complicating pregnancy, unspecified trimester: Secondary | ICD-10-CM

## 2018-07-24 DIAGNOSIS — O09523 Supervision of elderly multigravida, third trimester: Secondary | ICD-10-CM

## 2018-07-24 DIAGNOSIS — Z3A34 34 weeks gestation of pregnancy: Secondary | ICD-10-CM

## 2018-07-24 NOTE — Progress Notes (Signed)
Interpreter [name] used for encounter via phone. Pt states she did not sleep well and believes this is the reason for her slightly elevated BP. She reports that she is taking Labetalol daily as prescribed. She denies H/A or visual disturbances. Pt advised to go to MAU if she develops any of these symptoms. She voiced understanding.   Pt informed that the ultrasound is considered a limited OB ultrasound and is not intended to be a complete ultrasound exam.  Patient also informed that the ultrasound is not being completed with the intent of assessing for fetal or placental anomalies or any pelvic abnormalities.  Explained that the purpose of today's ultrasound is to assess for presentation, BPP and amniotic fluid volume  Patient acknowledges the purpose of the exam and the limitations of the study.

## 2018-07-29 NOTE — Progress Notes (Signed)
I have reviewed this chart and agree with the RN/CMA assessment and management.    Raima Geathers C Josef Tourigny, MD, FACOG Attending Physician, [facility]  

## 2018-07-31 ENCOUNTER — Encounter: Payer: Self-pay | Attending: Obstetrics and Gynecology | Admitting: *Deleted

## 2018-07-31 ENCOUNTER — Ambulatory Visit: Payer: Self-pay | Admitting: *Deleted

## 2018-07-31 ENCOUNTER — Ambulatory Visit (INDEPENDENT_AMBULATORY_CARE_PROVIDER_SITE_OTHER): Payer: Self-pay | Admitting: *Deleted

## 2018-07-31 ENCOUNTER — Ambulatory Visit (INDEPENDENT_AMBULATORY_CARE_PROVIDER_SITE_OTHER): Payer: Self-pay | Admitting: Obstetrics & Gynecology

## 2018-07-31 ENCOUNTER — Ambulatory Visit: Payer: Self-pay

## 2018-07-31 ENCOUNTER — Other Ambulatory Visit: Payer: Self-pay

## 2018-07-31 VITALS — BP 129/85 | HR 90 | Temp 98.3°F | Wt 217.5 lb

## 2018-07-31 DIAGNOSIS — O24414 Gestational diabetes mellitus in pregnancy, insulin controlled: Secondary | ICD-10-CM

## 2018-07-31 DIAGNOSIS — Z3A00 Weeks of gestation of pregnancy not specified: Secondary | ICD-10-CM | POA: Insufficient documentation

## 2018-07-31 DIAGNOSIS — O09523 Supervision of elderly multigravida, third trimester: Secondary | ICD-10-CM

## 2018-07-31 DIAGNOSIS — O09293 Supervision of pregnancy with other poor reproductive or obstetric history, third trimester: Secondary | ICD-10-CM

## 2018-07-31 DIAGNOSIS — O10919 Unspecified pre-existing hypertension complicating pregnancy, unspecified trimester: Secondary | ICD-10-CM

## 2018-07-31 DIAGNOSIS — Z113 Encounter for screening for infections with a predominantly sexual mode of transmission: Secondary | ICD-10-CM

## 2018-07-31 DIAGNOSIS — O24913 Unspecified diabetes mellitus in pregnancy, third trimester: Secondary | ICD-10-CM | POA: Insufficient documentation

## 2018-07-31 DIAGNOSIS — O24119 Pre-existing diabetes mellitus, type 2, in pregnancy, unspecified trimester: Secondary | ICD-10-CM

## 2018-07-31 DIAGNOSIS — O0990 Supervision of high risk pregnancy, unspecified, unspecified trimester: Secondary | ICD-10-CM

## 2018-07-31 DIAGNOSIS — O10913 Unspecified pre-existing hypertension complicating pregnancy, third trimester: Secondary | ICD-10-CM

## 2018-07-31 DIAGNOSIS — Z3A35 35 weeks gestation of pregnancy: Secondary | ICD-10-CM

## 2018-07-31 DIAGNOSIS — O0993 Supervision of high risk pregnancy, unspecified, third trimester: Secondary | ICD-10-CM

## 2018-07-31 DIAGNOSIS — O24113 Pre-existing diabetes mellitus, type 2, in pregnancy, third trimester: Secondary | ICD-10-CM

## 2018-07-31 NOTE — Progress Notes (Signed)
[place] for growth/BPP scheduled @ MFM on 8/6.   Pt informed that the ultrasound is considered a limited OB ultrasound and is not intended to be a complete ultrasound exam.  Patient also informed that the ultrasound is not being completed with the intent of assessing for fetal or placental anomalies or any pelvic abnormalities.  Explained that the purpose of today's ultrasound is to assess for presentation, BPP and amniotic fluid volume.  Patient acknowledges the purpose of the exam and the limitations of the study.

## 2018-07-31 NOTE — Progress Notes (Signed)
   PRENATAL VISIT NOTE  Subjective:  [name] is a 48 y.o. G3P2001 at [redacted]w[redacted]d being seen today for ongoing prenatal care.  She is currently monitored for the following issues for this high-risk pregnancy and has Prior pregnancy with fetal demise and current pregnancy; Supervision of high risk pregnancy, antepartum; Group beta Strep positive; Chronic hypertension in pregnancy; Type 2 diabetes mellitus affecting pregnancy, antepartum; and Advanced maternal age in multigravida on their problem list.  Patient reports no complaints.  Contractions: Not present. Vag. Bleeding: None.  Movement: Present. Denies leaking of fluid.   The following portions of the patient's history were reviewed and updated as appropriate: allergies, current medications, past family history, past medical history, past social history, past surgical history and problem list.   Objective:   Vitals:   07/31/18 0924  BP: 129/85  Pulse: 90  Temp: 98.3 F (36.8 C)  Weight: 217 lb 8 oz (98.7 kg)    Fetal Status: Fetal Heart Rate (bpm): 141   Movement: Present     General:  Alert, oriented and cooperative. Patient is in no acute distress.  Skin: Skin is warm and dry. No rash noted.   Cardiovascular: Normal heart rate noted  Respiratory: Normal respiratory effort, no problems with respiration noted  Abdomen: Soft, gravid, appropriate for gestational age.  Pain/Pressure: Absent     Pelvic: Cervical exam deferred        Extremities: Normal range of motion.  Edema: None  Mental Status: Normal mood and affect. Normal behavior. Normal judgment and thought content.   Assessment and Plan:  Pregnancy: G3P2001 at [redacted]w[redacted]d 1. Supervision of high risk pregnancy, antepartum  - GC/Chlamydia probe amp ([place])not at [facility]  2. Type 2 diabetes mellitus affecting pregnancy, antepartum   3. Chronic hypertension in pregnancy - on labetalol and baby asa  4. Multigravida of advanced maternal age in third trimester   5. Insulin  controlled gestational diabetes mellitus (GDM) in second trimester - fastings still in the high 90s and 2 hours still more than 120 so I will increase qhs NPH from 10-12 and AM NPH from 12 to 15 - weekly BPP with MFM  Preterm labor symptoms and general obstetric precautions including but not limited to vaginal bleeding, contractions, leaking of fluid and fetal movement were reviewed in detail with the patient. Please refer to After Visit Summary for other counseling recommendations.   No follow-ups on file.  Future Appointments  Date Time Provider [place]  07/31/2018 11:15 AM WOC-WOCA NST WOC-WOCA WOC  08/07/2018  9:15 AM Emily Filbert, MD WOC-WOCA WOC  08/07/2018 11:15 AM WOC-WOCA NST WOC-WOCA WOC  08/14/2018  2:15 PM WOC-WOCA NST WOC-WOCA WOC  08/14/2018  3:15 PM Sloan Leiter, MD [place]  08/21/2018  8:15 AM WOC-WOCA NST WOC-WOCA WOC  08/21/2018  9:35 AM Donnamae Jude, MD WOC-WOCA [place]  08/28/2018  8:55 AM Emily Filbert, MD WOC-WOCA WOC  08/28/2018 10:15 AM WOC-WOCA NST WOC-WOCA WOC  09/04/2018  9:15 AM WOC-WOCA NST WOC-WOCA WOC  09/04/2018 10:15 AM Woodroe Mode, MD WOC-WOCA WOC    Emily Filbert, MD

## 2018-07-31 NOTE — Progress Notes (Signed)
Patient was seen on 07/31/2018 for type 2 Diabetes and pregnancy self-management follow up visit. EDD 08/30/2018. She speaks [place], we used her husband on her cell phone as the interpretor.  Patient states history of this diabetes for several years. Patient continues to eat good variety of all food groups and beverages include water.  Patient is currently on Metformin which has been increased from 500 to 1000 mg twice daily and is taking NPH insulin twice a day as her diabetes medications. Patient states she has not side effects from Metformin and is getting enough food (not hungry).  Current insulin doses: NPH in AM @ 12 units NPH in PM @ 10 units  Based on elevated BG both fasting and post meal, I recommend increasing insulin doses: NPH in AM @ 15 units NPH in PM @ 12 units  Plan: Continue:  Aim for 3 Carb Choices per meal (45 grams) +/- 1 either way   Aim for 1-2 Carbs per snack  Continue with your activity level by walking or other activity daily as tolerated  Continue checking BG before breakfast and 2 hours after first bite of breakfast, lunch and dinner as directed by MD   Bring Log Book/Sheet and meter to every medical appointment   Take medication as directed by MD  Patient already has a meter:  And is testing pre breakfast and 2 hours each meal as directed by MD Review of Log Book shows: FBG all elevated in range of 99-100 mg/dl Post meal BG all elevated in range of 150-170 mg/dl. Patient taking Metformin @ 1000 mg BID  I spoke with Dr. Clovia Cuff who agreed with insulin dose increase   Patient instructed to monitor glucose levels: FBS: 60 - 95 mg/dl 2 hour: <120 mg/dl  Patient received the following handouts:  Written instructions on insulin dose increases  Plan: follow up as needed

## 2018-07-31 NOTE — Progress Notes (Signed)
NPH Insulin increased to 15 units in the morning and 12 units in the evening prior to bed per Verbal Order from Dr. Hulan Fray.

## 2018-08-01 LAB — GC/CHLAMYDIA PROBE AMP (~~LOC~~) NOT AT ARMC
Chlamydia: NEGATIVE
Neisseria Gonorrhea: NEGATIVE

## 2018-08-07 ENCOUNTER — Ambulatory Visit: Payer: Self-pay

## 2018-08-07 ENCOUNTER — Ambulatory Visit (INDEPENDENT_AMBULATORY_CARE_PROVIDER_SITE_OTHER): Payer: Self-pay | Admitting: Obstetrics & Gynecology

## 2018-08-07 ENCOUNTER — Other Ambulatory Visit: Payer: Self-pay

## 2018-08-07 ENCOUNTER — Ambulatory Visit (INDEPENDENT_AMBULATORY_CARE_PROVIDER_SITE_OTHER): Payer: Self-pay | Admitting: *Deleted

## 2018-08-07 VITALS — BP 135/89 | HR 86 | Temp 98.3°F | Wt 217.8 lb

## 2018-08-07 DIAGNOSIS — O10919 Unspecified pre-existing hypertension complicating pregnancy, unspecified trimester: Secondary | ICD-10-CM

## 2018-08-07 DIAGNOSIS — O0990 Supervision of high risk pregnancy, unspecified, unspecified trimester: Secondary | ICD-10-CM

## 2018-08-07 DIAGNOSIS — O99213 Obesity complicating pregnancy, third trimester: Secondary | ICD-10-CM

## 2018-08-07 DIAGNOSIS — O24113 Pre-existing diabetes mellitus, type 2, in pregnancy, third trimester: Secondary | ICD-10-CM

## 2018-08-07 DIAGNOSIS — Z3A36 36 weeks gestation of pregnancy: Secondary | ICD-10-CM

## 2018-08-07 DIAGNOSIS — O10913 Unspecified pre-existing hypertension complicating pregnancy, third trimester: Secondary | ICD-10-CM

## 2018-08-07 DIAGNOSIS — O24119 Pre-existing diabetes mellitus, type 2, in pregnancy, unspecified trimester: Secondary | ICD-10-CM

## 2018-08-07 DIAGNOSIS — Z113 Encounter for screening for infections with a predominantly sexual mode of transmission: Secondary | ICD-10-CM

## 2018-08-07 DIAGNOSIS — O09293 Supervision of pregnancy with other poor reproductive or obstetric history, third trimester: Secondary | ICD-10-CM

## 2018-08-07 DIAGNOSIS — O99210 Obesity complicating pregnancy, unspecified trimester: Secondary | ICD-10-CM

## 2018-08-07 DIAGNOSIS — O0993 Supervision of high risk pregnancy, unspecified, third trimester: Secondary | ICD-10-CM

## 2018-08-07 DIAGNOSIS — O09523 Supervision of elderly multigravida, third trimester: Secondary | ICD-10-CM

## 2018-08-07 DIAGNOSIS — B951 Streptococcus, group B, as the cause of diseases classified elsewhere: Secondary | ICD-10-CM

## 2018-08-07 DIAGNOSIS — O24414 Gestational diabetes mellitus in pregnancy, insulin controlled: Secondary | ICD-10-CM

## 2018-08-07 NOTE — Progress Notes (Signed)
   PRENATAL VISIT NOTE  Subjective:  [name] is a 48 y.o. G3P2001 at [redacted]w[redacted]d being seen today for ongoing prenatal care.  She is currently monitored for the following issues for this high-risk pregnancy and has Prior pregnancy with fetal demise and current pregnancy; Supervision of high risk pregnancy, antepartum; Group beta Strep positive; Chronic hypertension in pregnancy; Type 2 diabetes mellitus affecting pregnancy, antepartum; Advanced maternal age in multigravida; and Obesity in pregnancy on their problem list.  Patient reports no complaints.  Contractions: Not present. Vag. Bleeding: None.  Movement: Present. Denies leaking of fluid.   The following portions of the patient's history were reviewed and updated as appropriate: allergies, current medications, past family history, past medical history, past social history, past surgical history and problem list.   Objective:   Vitals:   08/07/18 0944  BP: 135/89  Pulse: 86  Temp: 98.3 F (36.8 C)  Weight: 217 lb 12.8 oz (98.8 kg)    Fetal Status: Fetal Heart Rate (bpm): 135   Movement: Present     General:  Alert, oriented and cooperative. Patient is in no acute distress.  Skin: Skin is warm and dry. No rash noted.   Cardiovascular: Normal heart rate noted  Respiratory: Normal respiratory effort, no problems with respiration noted  Abdomen: Soft, gravid, appropriate for gestational age.  Pain/Pressure: Absent     Pelvic: Cervical exam performed        Extremities: Normal range of motion.  Edema: None  Mental Status: Normal mood and affect. Normal behavior. Normal judgment and thought content.   Assessment and Plan:  Pregnancy: G3P2001 at [redacted]w[redacted]d 1. Chronic hypertension in pregnancy  2. Multigravida of advanced maternal age in third trimester  3. Insulin controlled gestational diabetes mellitus (GDM) in second trimester - fastings are still high 90s and above- I told her to increase qhs NPH from 12 to 14 - 2 hours are still  above 120- I told her to increase AM NPH from 15 to 18 - weekly BPP - MFM u/s 08/14/18  4. Group beta Strep positive - treat in labor  5. Supervision of high risk pregnancy, antepartum   6. Obesity in pregnancy   Preterm labor symptoms and general obstetric precautions including but not limited to vaginal bleeding, contractions, leaking of fluid and fetal movement were reviewed in detail with the patient. Please refer to After Visit Summary for other counseling recommendations.   No follow-ups on file.  Future Appointments  Date Time Provider [place]  08/07/2018 11:15 AM WOC-WOCA NST WOC-WOCA WOC  08/14/2018 12:30 PM [place] MFC-US  08/14/2018 12:30 PM [place] 1 WH-MFCUS MFC-US  08/14/2018  2:15 PM WOC-WOCA NST WOC-WOCA WOC  08/14/2018  3:15 PM Woodroe Mode, MD [place]  08/21/2018  8:15 AM WOC-WOCA NST WOC-WOCA WOC  08/21/2018  9:35 AM Donnamae Jude, MD WOC-WOCA WOC    Emily Filbert, MD

## 2018-08-08 LAB — CERVICOVAGINAL ANCILLARY ONLY
Chlamydia: NEGATIVE
Neisseria Gonorrhea: NEGATIVE

## 2018-08-14 ENCOUNTER — Other Ambulatory Visit: Payer: Self-pay | Admitting: Obstetrics & Gynecology

## 2018-08-14 ENCOUNTER — Ambulatory Visit (INDEPENDENT_AMBULATORY_CARE_PROVIDER_SITE_OTHER): Payer: Self-pay | Admitting: *Deleted

## 2018-08-14 ENCOUNTER — Encounter: Payer: Self-pay | Admitting: Obstetrics and Gynecology

## 2018-08-14 ENCOUNTER — Ambulatory Visit (HOSPITAL_COMMUNITY)
Admission: RE | Admit: 2018-08-14 | Discharge: 2018-08-14 | Disposition: A | Discharge status: Home / Self Care | Payer: Self-pay | Source: Ambulatory Visit | Attending: Obstetrics and Gynecology | Admitting: Obstetrics and Gynecology

## 2018-08-14 ENCOUNTER — Ambulatory Visit (HOSPITAL_COMMUNITY): Payer: Self-pay | Admitting: *Deleted

## 2018-08-14 ENCOUNTER — Encounter (HOSPITAL_COMMUNITY): Payer: Self-pay

## 2018-08-14 ENCOUNTER — Other Ambulatory Visit: Payer: Self-pay

## 2018-08-14 ENCOUNTER — Ambulatory Visit (INDEPENDENT_AMBULATORY_CARE_PROVIDER_SITE_OTHER): Payer: Self-pay | Admitting: Obstetrics & Gynecology

## 2018-08-14 VITALS — BP 144/83 | HR 90 | Wt 219.8 lb

## 2018-08-14 DIAGNOSIS — O09293 Supervision of pregnancy with other poor reproductive or obstetric history, third trimester: Secondary | ICD-10-CM

## 2018-08-14 DIAGNOSIS — O09523 Supervision of elderly multigravida, third trimester: Secondary | ICD-10-CM

## 2018-08-14 DIAGNOSIS — O0990 Supervision of high risk pregnancy, unspecified, unspecified trimester: Secondary | ICD-10-CM | POA: Insufficient documentation

## 2018-08-14 DIAGNOSIS — O24119 Pre-existing diabetes mellitus, type 2, in pregnancy, unspecified trimester: Secondary | ICD-10-CM

## 2018-08-14 DIAGNOSIS — O0993 Supervision of high risk pregnancy, unspecified, third trimester: Secondary | ICD-10-CM

## 2018-08-14 DIAGNOSIS — Z3A37 37 weeks gestation of pregnancy: Secondary | ICD-10-CM

## 2018-08-14 DIAGNOSIS — B951 Streptococcus, group B, as the cause of diseases classified elsewhere: Secondary | ICD-10-CM

## 2018-08-14 DIAGNOSIS — O24414 Gestational diabetes mellitus in pregnancy, insulin controlled: Secondary | ICD-10-CM

## 2018-08-14 DIAGNOSIS — O10919 Unspecified pre-existing hypertension complicating pregnancy, unspecified trimester: Secondary | ICD-10-CM

## 2018-08-14 DIAGNOSIS — Z362 Encounter for other antenatal screening follow-up: Secondary | ICD-10-CM

## 2018-08-14 DIAGNOSIS — O10913 Unspecified pre-existing hypertension complicating pregnancy, third trimester: Secondary | ICD-10-CM

## 2018-08-14 DIAGNOSIS — O24113 Pre-existing diabetes mellitus, type 2, in pregnancy, third trimester: Secondary | ICD-10-CM

## 2018-08-14 DIAGNOSIS — O98513 Other viral diseases complicating pregnancy, third trimester: Secondary | ICD-10-CM

## 2018-08-14 DIAGNOSIS — O99210 Obesity complicating pregnancy, unspecified trimester: Secondary | ICD-10-CM

## 2018-08-14 NOTE — Progress Notes (Signed)
   PRENATAL VISIT NOTE  Subjective:  [name] is a 48 y.o. G3P2001 at [redacted]w[redacted]d being seen today for ongoing prenatal care.  She is currently monitored for the following issues for this high-risk pregnancy and has Prior pregnancy with fetal demise and current pregnancy; Supervision of high risk pregnancy, antepartum; Group beta Strep positive; Chronic hypertension in pregnancy; Type 2 diabetes mellitus affecting pregnancy, antepartum; Advanced maternal age in multigravida; and Obesity in pregnancy on their problem list.  Patient reports no complaints.  Contractions: Not present. Vag. Bleeding: None.  Movement: Present. Denies leaking of fluid.   The following portions of the patient's history were reviewed and updated as appropriate: allergies, current medications, past family history, past medical history, past social history, past surgical history and problem list.   Objective:   Vitals:   08/14/18 1458  BP: (!) 144/83  Pulse: 90  Weight: 219 lb 12.8 oz (99.7 kg)    Fetal Status: Fetal Heart Rate (bpm): NST   Movement: Present     General:  Alert, oriented and cooperative. Patient is in no acute distress.  Skin: Skin is warm and dry. No rash noted.   Cardiovascular: Normal heart rate noted  Respiratory: Normal respiratory effort, no problems with respiration noted  Abdomen: Soft, gravid, appropriate for gestational age.  Pain/Pressure: Absent     Pelvic: Cervical exam deferred        Extremities: Normal range of motion.  Edema: None  Mental Status: Normal mood and affect. Normal behavior. Normal judgment and thought content.   Assessment and Plan:  Pregnancy: G3P2001 at [redacted]w[redacted]d 1. Supervision of high risk pregnancy, antepartum Normal fetal testing today  2. Chronic hypertension in pregnancy No severe range pressures  3. Multigravida of advanced maternal age in third trimester   4. Insulin controlled gestational diabetes mellitus (GDM) in second trimester Fair control on  insulin  5. Group beta Strep positive Needs abx prophylaxis  6. Prior pregnancy with fetal demise and current pregnancy in third trimester IOL 38 weeks  Term labor symptoms and general obstetric precautions including but not limited to vaginal bleeding, contractions, leaking of fluid and fetal movement were reviewed in detail with the patient. Please refer to After Visit Summary for other counseling recommendations.   Return in about 4 weeks (around 09/11/2018).  Future Appointments  Date Time Provider [place]  08/17/2018  7:00 AM MC-LD SCHED ROOM MC-INDC None    Emeterio Reeve, MD

## 2018-08-14 NOTE — Progress Notes (Signed)
[place] for growth and BPP done @ MFM today.  IOL scheduled on 8/9 @ 0700

## 2018-08-14 NOTE — Patient Instructions (Signed)

## 2018-08-15 ENCOUNTER — Other Ambulatory Visit: Payer: Self-pay | Admitting: Obstetrics & Gynecology

## 2018-08-15 DIAGNOSIS — O10919 Unspecified pre-existing hypertension complicating pregnancy, unspecified trimester: Secondary | ICD-10-CM

## 2018-08-17 ENCOUNTER — Other Ambulatory Visit: Payer: Self-pay

## 2018-08-17 ENCOUNTER — Inpatient Hospital Stay (HOSPITAL_COMMUNITY): Payer: Medicaid Other

## 2018-08-17 ENCOUNTER — Inpatient Hospital Stay (HOSPITAL_COMMUNITY)
Admission: AD | Admit: 2018-08-17 | Discharge: 2018-08-20 | DRG: 807 | Disposition: A | Discharge status: Home / Self Care | Payer: Medicaid Other | Attending: Obstetrics & Gynecology | Admitting: Obstetrics & Gynecology

## 2018-08-17 ENCOUNTER — Encounter (HOSPITAL_COMMUNITY): Payer: Self-pay | Admitting: *Deleted

## 2018-08-17 DIAGNOSIS — E669 Obesity, unspecified: Secondary | ICD-10-CM | POA: Diagnosis present

## 2018-08-17 DIAGNOSIS — O99824 Streptococcus B carrier state complicating childbirth: Secondary | ICD-10-CM | POA: Diagnosis present

## 2018-08-17 DIAGNOSIS — Z20828 Contact with and (suspected) exposure to other viral communicable diseases: Secondary | ICD-10-CM | POA: Diagnosis present

## 2018-08-17 DIAGNOSIS — O1414 Severe pre-eclampsia complicating childbirth: Secondary | ICD-10-CM | POA: Diagnosis not present

## 2018-08-17 DIAGNOSIS — O114 Pre-existing hypertension with pre-eclampsia, complicating childbirth: Principal | ICD-10-CM | POA: Diagnosis present

## 2018-08-17 DIAGNOSIS — Z3A38 38 weeks gestation of pregnancy: Secondary | ICD-10-CM

## 2018-08-17 DIAGNOSIS — B951 Streptococcus, group B, as the cause of diseases classified elsewhere: Secondary | ICD-10-CM

## 2018-08-17 DIAGNOSIS — Z794 Long term (current) use of insulin: Secondary | ICD-10-CM | POA: Diagnosis not present

## 2018-08-17 DIAGNOSIS — O24119 Pre-existing diabetes mellitus, type 2, in pregnancy, unspecified trimester: Secondary | ICD-10-CM

## 2018-08-17 DIAGNOSIS — O99214 Obesity complicating childbirth: Secondary | ICD-10-CM | POA: Diagnosis present

## 2018-08-17 DIAGNOSIS — O09523 Supervision of elderly multigravida, third trimester: Secondary | ICD-10-CM

## 2018-08-17 DIAGNOSIS — O2412 Pre-existing diabetes mellitus, type 2, in childbirth: Secondary | ICD-10-CM | POA: Diagnosis present

## 2018-08-17 DIAGNOSIS — E1165 Type 2 diabetes mellitus with hyperglycemia: Secondary | ICD-10-CM | POA: Diagnosis present

## 2018-08-17 DIAGNOSIS — E119 Type 2 diabetes mellitus without complications: Secondary | ICD-10-CM | POA: Diagnosis not present

## 2018-08-17 DIAGNOSIS — O10919 Unspecified pre-existing hypertension complicating pregnancy, unspecified trimester: Secondary | ICD-10-CM | POA: Diagnosis present

## 2018-08-17 DIAGNOSIS — O0990 Supervision of high risk pregnancy, unspecified, unspecified trimester: Secondary | ICD-10-CM

## 2018-08-17 DIAGNOSIS — O1002 Pre-existing essential hypertension complicating childbirth: Secondary | ICD-10-CM | POA: Diagnosis present

## 2018-08-17 DIAGNOSIS — Z7984 Long term (current) use of oral hypoglycemic drugs: Secondary | ICD-10-CM | POA: Diagnosis not present

## 2018-08-17 DIAGNOSIS — O99210 Obesity complicating pregnancy, unspecified trimester: Secondary | ICD-10-CM

## 2018-08-17 DIAGNOSIS — O1092 Unspecified pre-existing hypertension complicating childbirth: Secondary | ICD-10-CM | POA: Diagnosis present

## 2018-08-17 LAB — COMPREHENSIVE METABOLIC PANEL
ALT: 29 U/L (ref 0–44)
AST: 33 U/L (ref 15–41)
Albumin: 2.8 g/dL — ABNORMAL LOW (ref 3.5–5.0)
Alkaline Phosphatase: 76 U/L (ref 38–126)
Anion gap: 11 (ref 5–15)
BUN: 9 mg/dL (ref 6–20)
CO2: 18 mmol/L — ABNORMAL LOW (ref 22–32)
Calcium: 9.8 mg/dL (ref 8.9–10.3)
Chloride: 103 mmol/L (ref 98–111)
Creatinine, Ser: 0.58 mg/dL (ref 0.44–1.00)
GFR calc Af Amer: >60 mL/min (ref >60)
GFR calc non Af Amer: >60 mL/min (ref >60)
Glucose, Bld: 252 mg/dL — ABNORMAL HIGH (ref 70–99)
Potassium: 4.6 mmol/L (ref 3.5–5.1)
Sodium: 132 mmol/L — ABNORMAL LOW (ref 135–145)
Total Bilirubin: 0.2 mg/dL — ABNORMAL LOW (ref 0.3–1.2)
Total Protein: 6.2 g/dL — ABNORMAL LOW (ref 6.5–8.1)

## 2018-08-17 LAB — CBC
HCT: 40.6 % (ref 36.0–46.0)
Hemoglobin: 13.6 g/dL (ref 12.0–15.0)
MCH: 26.7 pg (ref 26.0–34.0)
MCHC: 33.5 g/dL (ref 30.0–36.0)
MCV: 79.8 fL — ABNORMAL LOW (ref 80.0–100.0)
Platelets: 322 10*3/uL (ref 150–400)
RBC: 5.09 MIL/uL (ref 3.87–5.11)
RDW: 13.3 % (ref 11.5–15.5)
WBC: 6.8 10*3/uL (ref 4.0–10.5)
nRBC: 0 % (ref 0.0–0.2)

## 2018-08-17 LAB — GLUCOSE, CAPILLARY
Glucose-Capillary: 194 mg/dL — ABNORMAL HIGH (ref 70–99)
Glucose-Capillary: 115 mg/dL — ABNORMAL HIGH (ref 70–99)
Glucose-Capillary: 118 mg/dL — ABNORMAL HIGH (ref 70–99)
Glucose-Capillary: 105 mg/dL — ABNORMAL HIGH (ref 70–99)

## 2018-08-17 LAB — TYPE AND SCREEN
ABO/RH(D): O POS
Antibody Screen: NEGATIVE

## 2018-08-17 LAB — PROTEIN / CREATININE RATIO, URINE
Creatinine, Urine: 65.7 mg/dL
Protein Creatinine Ratio: 0.2 mg/mg{Cre} — ABNORMAL HIGH (ref 0.00–0.15)
Total Protein, Urine: 13 mg/dL

## 2018-08-17 LAB — SARS CORONAVIRUS 2 BY RT PCR (HOSPITAL ORDER, PERFORMED IN ~~LOC~~ HOSPITAL LAB): SARS Coronavirus 2: NEGATIVE

## 2018-08-17 MED ORDER — MAGNESIUM SULFATE BOLUS VIA INFUSION
4.0000 g | Freq: Once | INTRAVENOUS | Status: AC
  Administered 2018-08-17: 4 g via INTRAVENOUS
  Filled 2018-08-17: qty 500

## 2018-08-17 MED ORDER — LIDOCAINE HCL (PF) 1 % IJ SOLN: 30.0000 mL | INTRAMUSCULAR | Status: DC | PRN

## 2018-08-17 MED ORDER — LABETALOL HCL 5 MG/ML IV SOLN
INTRAVENOUS | Status: AC
  Filled 2018-08-17: qty 4

## 2018-08-17 MED ORDER — TERBUTALINE SULFATE 1 MG/ML IJ SOLN: 0.2500 mg | Freq: Once | INTRAMUSCULAR | Status: DC | PRN

## 2018-08-17 MED ORDER — MAGNESIUM SULFATE 40 G IN LACTATED RINGERS - SIMPLE
2.0000 g/h | INTRAVENOUS | Status: DC
  Administered 2018-08-17 – 2018-08-18 (×2): 2 g/h via INTRAVENOUS
  Filled 2018-08-17 (×2): qty 500

## 2018-08-17 MED ORDER — OXYCODONE-ACETAMINOPHEN 5-325 MG PO TABS: 1.0000 | ORAL_TABLET | ORAL | Status: DC | PRN

## 2018-08-17 MED ORDER — LABETALOL HCL 5 MG/ML IV SOLN
80.0000 mg | INTRAVENOUS | Status: DC | PRN
  Administered 2018-08-17: 80 mg via INTRAVENOUS
  Filled 2018-08-17: qty 16

## 2018-08-17 MED ORDER — LABETALOL HCL 5 MG/ML IV SOLN
20.0000 mg | INTRAVENOUS | Status: DC | PRN
  Administered 2018-08-17: 20 mg via INTRAVENOUS
  Filled 2018-08-17: qty 4

## 2018-08-17 MED ORDER — HYDRALAZINE HCL 20 MG/ML IJ SOLN
10.0000 mg | INTRAMUSCULAR | Status: DC | PRN
  Administered 2018-08-18: 10 mg via INTRAVENOUS
  Filled 2018-08-17: qty 1

## 2018-08-17 MED ORDER — LACTATED RINGERS IV SOLN
INTRAVENOUS | Status: DC
  Administered 2018-08-17 – 2018-08-18 (×3): via INTRAVENOUS

## 2018-08-17 MED ORDER — OXYTOCIN 40 UNITS IN NORMAL SALINE INFUSION - SIMPLE MED: 2.5000 [IU]/h | INTRAVENOUS | Status: DC

## 2018-08-17 MED ORDER — INSULIN GLARGINE 100 UNIT/ML ~~LOC~~ SOLN
9.0000 [IU] | Freq: Every day | SUBCUTANEOUS | Status: DC
  Administered 2018-08-17 – 2018-08-18 (×2): 9 [IU] via SUBCUTANEOUS
  Filled 2018-08-17 (×4): qty 0.09

## 2018-08-17 MED ORDER — HYDRALAZINE HCL 20 MG/ML IJ SOLN: 10.0000 mg | INTRAMUSCULAR | Status: DC | PRN

## 2018-08-17 MED ORDER — INSULIN ASPART 100 UNIT/ML ~~LOC~~ SOLN
0.0000 [IU] | Freq: Three times a day (TID) | SUBCUTANEOUS | Status: DC
  Administered 2018-08-17: 10:00:00 3 [IU] via SUBCUTANEOUS
  Administered 2018-08-18: 2 [IU] via SUBCUTANEOUS
  Administered 2018-08-18 – 2018-08-19 (×2): 3 [IU] via SUBCUTANEOUS
  Administered 2018-08-19 – 2018-08-20 (×2): 2 [IU] via SUBCUTANEOUS

## 2018-08-17 MED ORDER — PENICILLIN G 3 MILLION UNITS IVPB - SIMPLE MED
3.0000 10*6.[IU] | INTRAVENOUS | Status: DC
  Administered 2018-08-17 – 2018-08-18 (×5): 3 10*6.[IU] via INTRAVENOUS
  Filled 2018-08-17 (×7): qty 100

## 2018-08-17 MED ORDER — LABETALOL HCL 5 MG/ML IV SOLN
20.0000 mg | INTRAVENOUS | Status: DC | PRN
  Administered 2018-08-18: 20 mg via INTRAVENOUS

## 2018-08-17 MED ORDER — HYDRALAZINE HCL 20 MG/ML IJ SOLN
5.0000 mg | INTRAMUSCULAR | Status: DC | PRN
  Administered 2018-08-17 – 2018-08-18 (×2): 5 mg via INTRAVENOUS
  Filled 2018-08-17 (×2): qty 1

## 2018-08-17 MED ORDER — TERBUTALINE SULFATE 1 MG/ML IJ SOLN
0.2500 mg | Freq: Once | INTRAMUSCULAR | Status: DC | PRN
  Filled 2018-08-17: qty 1

## 2018-08-17 MED ORDER — LABETALOL HCL 200 MG PO TABS
200.0000 mg | ORAL_TABLET | Freq: Two times a day (BID) | ORAL | Status: DC
  Administered 2018-08-17 – 2018-08-20 (×7): 200 mg via ORAL
  Filled 2018-08-17 (×7): qty 1

## 2018-08-17 MED ORDER — LACTATED RINGERS IV SOLN: 500.0000 mL | INTRAVENOUS | Status: DC | PRN

## 2018-08-17 MED ORDER — ONDANSETRON HCL 4 MG/2ML IJ SOLN: 4.0000 mg | Freq: Four times a day (QID) | INTRAMUSCULAR | Status: DC | PRN

## 2018-08-17 MED ORDER — OXYCODONE-ACETAMINOPHEN 5-325 MG PO TABS: 2.0000 | ORAL_TABLET | ORAL | Status: DC | PRN

## 2018-08-17 MED ORDER — LABETALOL HCL 5 MG/ML IV SOLN
40.0000 mg | INTRAVENOUS | Status: DC | PRN
  Administered 2018-08-18: 40 mg via INTRAVENOUS

## 2018-08-17 MED ORDER — ACETAMINOPHEN 325 MG PO TABS
650.0000 mg | ORAL_TABLET | ORAL | Status: DC | PRN
  Administered 2018-08-17 (×2): 650 mg via ORAL
  Filled 2018-08-17 (×2): qty 2

## 2018-08-17 MED ORDER — SOD CITRATE-CITRIC ACID 500-334 MG/5ML PO SOLN
30.0000 mL | ORAL | Status: DC | PRN
  Administered 2018-08-18: 30 mL via ORAL
  Filled 2018-08-17: qty 30

## 2018-08-17 MED ORDER — LABETALOL HCL 5 MG/ML IV SOLN
40.0000 mg | INTRAVENOUS | Status: DC | PRN
  Administered 2018-08-17: 40 mg via INTRAVENOUS
  Filled 2018-08-17 (×2): qty 8

## 2018-08-17 MED ORDER — MISOPROSTOL 25 MCG QUARTER TABLET
25.0000 ug | ORAL_TABLET | ORAL | Status: DC
  Administered 2018-08-17: 25 ug via VAGINAL
  Filled 2018-08-17 (×2): qty 1

## 2018-08-17 MED ORDER — MISOPROSTOL 50MCG HALF TABLET
50.0000 ug | ORAL_TABLET | ORAL | Status: DC | PRN
  Administered 2018-08-17 (×3): 50 ug via BUCCAL
  Filled 2018-08-17 (×3): qty 1

## 2018-08-17 MED ORDER — OXYTOCIN BOLUS FROM INFUSION
500.0000 mL | Freq: Once | INTRAVENOUS | Status: AC
  Administered 2018-08-18: 500 mL via INTRAVENOUS

## 2018-08-17 MED ORDER — SODIUM CHLORIDE 0.9 % IV SOLN
5.0000 10*6.[IU] | Freq: Once | INTRAVENOUS | Status: AC
  Administered 2018-08-17: 5 10*6.[IU] via INTRAVENOUS
  Filled 2018-08-17: qty 5

## 2018-08-17 MED ORDER — INSULIN ASPART 100 UNIT/ML ~~LOC~~ SOLN: 0.0000 [IU] | Freq: Three times a day (TID) | SUBCUTANEOUS | Status: DC

## 2018-08-17 NOTE — Progress Notes (Addendum)
[name] is a 48 y.o. G3P2001 at [redacted]w[redacted]d admitted for induction of labor due to [facility] with superimposed pre-e.  Subjective:  Pt is sitting in chair, doing well. Has no complaints.   Objective: BP (!) 158/80   Pulse 85   Temp 98.3 F (36.8 C) (Oral)   Resp 18   Ht 5\' 5"  (1.651 m)   Wt 99.8 kg   LMP  (LMP Unknown)   BMI 36.61 kg/m  I/O last 3 completed shifts: In: 1395.6 [P.O.:240; I.V.:1055.6; IV Piggyback:100] Out: 200 [Urine:200] Total I/O In: 526.7 [P.O.:180; I.V.:246.7; IV Piggyback:100] Out: 600 [Urine:600]  FHT:  FHR: 120 bpm, variability: moderate,  accelerations:  Present,  decelerations:  Absent UC:   Irregular  SVE:   Dilation: 1.5 Effacement (%): 50 Station: Ballotable Exam by:: A. Durene Romans, RN  Labs: Lab Results  Component Value Date   WBC 6.8 08/17/2018   HGB 13.6 08/17/2018   HCT 40.6 08/17/2018   MCV 79.8 (L) 08/17/2018   PLT 322 08/17/2018    Assessment / Plan: IOL for cHTN with superimposed pre-e, currently on mag sulfate  Labor: plan for 4th dose of cytotec and placement of foley balloon at next cervical exam Preeclampsia:  on magnesium sulfate, no signs or symptoms of toxicity and labetalol protocol as well as labetalol 200mg  BID. Pt asymptomatic.  Fetal Wellbeing:  Category I Pain Control:  Labor support without medications Anticipated MOD:  NSVD  Renee Harder, SNM 08/17/2018, 9:22 PM

## 2018-08-17 NOTE — Progress Notes (Signed)
OB/GYN Faculty Practice: Labor Progress Note  Subjective: Patient doing well without complaints. No headaches, vision changes, CP, SOB, LE edema, RUQ pain.  Objective: BP (!) 161/94   Pulse 79   Temp 98.2 F (36.8 C) (Oral)   Resp 16   Ht 5\' 5"  (1.651 m)   Wt 99.8 kg   LMP  (LMP Unknown)   BMI 36.61 kg/m  Gen: well appearing, NAD Dilation: 1.5 Effacement (%): 50 Cervical Position: Middle Station: Ballotable Presentation: Vertex Exam by:: A. Durene Romans, RN  FHT: baseline 130bpm, mod variability, +accels, no decels Toco: q4-6 minutes  Assessment and Plan: 48 y.o. G3P2001 [redacted]w[redacted]d here for IOL-cHTN with super imposed preE w/ SF.   Labor: S/p cytotec x2. Will give another dose of cytotec and continue time appropriate cervical exams. Given station of baby, unable to attempt FB at this time. -- pain control: doing well -- PPH Risk: low  Fetal Well-Being: Cephalic by BSUS.  -- Category 1 - continuous fetal monitoring  -- GBS positive (pcn)  cHTN with superimposed preE w/ SF --labetalol 200 mg BID, continued --patient asymptomatic --PIH labs unremarkable --multiple severe range blood pressures since admission --labetalol/mg protocol  DMII --lantus 9U qhs while admitted in place of home NPH --SSI --q4 cbg  Corliss Blacker, PGY-III Family Medicine 6:18 PM

## 2018-08-17 NOTE — Plan of Care (Signed)
  Problem: Education: Goal: Ability to make informed decisions regarding treatment and plan of care will improve Outcome: Progressing   Problem: Education: Goal: Knowledge of General Education information will improve Description: Including pain rating scale, medication(s)/side effects and non-pharmacologic comfort measures Outcome: Progressing   

## 2018-08-17 NOTE — Progress Notes (Signed)
Notified by RN of patient's need for three doses of labetalol to bring severe range blood pressure down to 140/90; will initiate MagSo4 for seizure prophylaxis.    Patient Vitals for the past 24 hrs:  BP Temp Temp src Pulse Resp Height Weight  08/17/18 1516 (!) 146/90 - - 84 - - -  08/17/18 1501 (!) 167/89 - - 84 - - -  08/17/18 1446 (!) 168/85 - - 82 - - -  08/17/18 1431 (!) 164/87 - - 79 - - -  08/17/18 1423 (!) 168/93 98.2 F (36.8 C) Oral 79 18 - -  08/17/18 1237 (!) 165/96 - - 88 - - -  08/17/18 1223 (!) 164/93 - - 95 - - -  08/17/18 1014 (!) 149/95 98 F (36.7 C) Oral 89 18 - -  08/17/18 0809 (!) 149/93 - - 88 - - -  08/17/18 0755 (!) 170/100 98.3 F (36.8 C) Oral 93 18 5\' 5"  (1.651 m) 99.8 kg

## 2018-08-17 NOTE — H&P (Addendum)
OBSTETRIC ADMISSION HISTORY AND PHYSICAL  [name] is a 48 y.o. female G3P2001 with IUP at 65w1dby 31 week u/s presenting for IOL-cHTN/DMII.   Reports fetal movement. Denies vaginal bleeding. No LOF. No contractions.  [facility]: [facility]  Confirmed with husband on the phone that patient *did not* have IUFD at 924 months but that baby lived 980monthhs [place]   Ultrasounds . Anatomy U/S: unremarkable 07/04/2018  Prenatal History/Complications: . Late PNC . cHTN . DMII . AMA . Maternal obesity . Hx of deceased child to malaria . Language barrier ([place])  Past Medical History: Past Medical History:  Diagnosis Date  . Hypertension   . Type II diabetes mellitus with complication ([facility]     Past Surgical History: Past Surgical History:  Procedure Laterality Date  . DILATION AND CURETTAGE OF UTERUS    . HYSTEROSCOPY WITH RESECTOSCOPE  2011    Obstetrical History: OB History    Gravida  3   Para  2   Term  2   Preterm      AB      Living  1     SAB      TAB      Ectopic      Multiple      Live Births  2           Social History: Social History   Socioeconomic History  . Marital status: Married    Spouse name: Not on file  . Number of children: Not on file  . Years of education: Not on file  . Highest education level: Not on file  Occupational History  . Not on file  Social Needs  . Financial resource strain: Not on file  . Food insecurity    Worry: Not on file    Inability: Not on file  . Transportation needs    Medical: Not on file    Non-medical: Not on file  Tobacco Use  . Smoking status: Never Smoker  . Smokeless tobacco: Never Used  Substance and Sexual Activity  . Alcohol use: No  . Drug use: No  . Sexual activity: Not Currently    Birth control/protection: None  Lifestyle  . Physical activity    Days per week: Not on file    Minutes per  session: Not on file  . Stress: Not on file  Relationships  . Social [place]: Not on file    Gets together: Not on file    Attends religious service: Not on file    Active member of club or organization: Not on file    Attends meetings of clubs or organizations: Not on file    Relationship status: Not on file  Other Topics Concern  . Not on file  Social History Narrative  . Not on file    Family History: Family History  Problem Relation Age of Onset  . Alcohol abuse Neg Hx     Allergies: No Known Allergies  Medications Prior to Admission  Medication Sig Dispense Refill Last Dose  . acetaminophen (TYLENOL) 325 MG tablet Take 650 mg by mouth every 6 (six) hours as needed.     .Marland Kitchenamoxicillin (AMOXIL) 500 MG capsule Take 1 capsule (500 mg total) by mouth 3 (three) times daily. (Patient not taking: Reported on 08/14/2018) 21 capsule 2   . Blood Glucose Monitoring Suppl (TRUE METRIX  METER) w/Device KIT Use as instructed 1 kit 0   . glucose blood (TRUE METRIX BLOOD GLUCOSE TEST) test strip Use as instructed 100 each 12   . insulin NPH Human (NOVOLIN N) 100 UNIT/ML injection Inject 0.1 mLs (10 Units total) into the skin 2 (two) times a day. Inject 0.1 ml (10 units)  at 9 am and 0.1 (10 Units) ml at 9 pm qhs (Patient not taking: Reported on 08/14/2018) 10 mL 3   . Insulin Syringe-Needle U-100 (SAFETY INSULIN SYRINGES) 30G X 5/16" 0.5 ML MISC 1 Syringe by Does not apply route 2 (two) times a day. 100 each 3   . labetalol (NORMODYNE) 200 MG tablet Take 1 tablet (200 mg total) by mouth 2 (two) times daily. 60 tablet 2   . metFORMIN (GLUCOPHAGE) 1000 MG tablet Take 1 tablet (1,000 mg total) by mouth 2 (two) times daily with a meal. 60 tablet 3   . Prenatal Vit-Fe Fumarate-FA (MULTIVITAMIN-PRENATAL) 27-0.8 MG TABS tablet Take 1 tablet by mouth daily at 12 noon.     Marland Kitchen terconazole (TERAZOL 3) 0.8 % vaginal cream Place 1 applicator vaginally at bedtime. (Patient not taking:  Reported on 08/07/2018) 20 g 0   . TRUEplus Lancets 28G MISC Use to check blood sugar 4 times per day (fasting & 2 hours after meals) 100 each 3      Review of Systems  All systems reviewed and negative except as stated in HPI  Blood pressure (!) 149/93, pulse 88, temperature 98.3 F (36.8 C), temperature source Oral, resp. rate 18, height 5' 5"  (1.651 m), weight 99.8 kg, currently breastfeeding. General appearance: alert, cooperative and no distress Lungs: no respiratory distress Heart: regular rate  Abdomen: soft, non-tender; gravid b Pelvic: 0.5/th/-3 Extremities: Homans sign is negative, no sign of DVT Presentation: cephalic Fetal monitoring: baseline 130 bpm, mod variability, +accels, no decels Uterine activity: q5-8 minutes    Prenatal labs: ABO, Rh: O/Positive/-- (07/09 1044) Antibody: Negative (07/09 1044) Rubella: 1.56 (07/09 1044) RPR: Non Reactive (07/09 1044)  HBsAg: Negative (07/09 1044)  HIV: Non Reactive (07/09 1044)  GBS:  positive  Glucola: type II DM Genetic screening: too late to care  Prenatal Transfer Tool  Maternal Diabetes: Yes:  Diabetes Type:  Insulin/Medication controlled (uncontrolled) Genetic Screening: Declined Maternal Ultrasounds/Referrals: Normal Fetal Ultrasounds or other Referrals:  None Maternal Substance Abuse:  No Significant Maternal Medications:  Meds include: Other: insulin Significant Maternal Lab Results: Group B Strep positive  No results found for this or any previous visit (from the past 24 hour(s)).  Patient Active Problem List   Diagnosis Date Noted  . Chronic hypertension during pregnancy, antepartum 08/17/2018  . Obesity in pregnancy 08/07/2018  . Chronic hypertension in pregnancy 07/17/2018  . Type 2 diabetes mellitus affecting pregnancy, antepartum 07/17/2018  . Advanced maternal age in multigravida 07/17/2018  . Supervision of high risk pregnancy, antepartum 07/06/2018  . Group beta Strep positive 07/06/2018  .  Prior pregnancy with fetal demise and current pregnancy 11/16/2016    Assessment/Plan:  [name] is a 48 y.o. G3P2001 at 97w1dhere for IOL-cHTN/uncontrolled DM II.  Labor: will initiate cytotec and place FB when able. Continue time appropriate cervical exams. -- pain control: currently no pain, unsure about epidural  Fetal Wellbeing: EFW 3009g by 8/6 u/s. Cephalic by BSUS.  -- GBS (positive)- PCN ordered -- continuous fetal monitoring - cat 1   cHTN --not on meds --blood pressures elevated upon admission --PIH labs pending --continue to monitor  DMII --uncontrolled, last a1c 9.1 (07/04/2018) --EFW 3009g (u/s 08/14/18) --q4 cbg --lantus 9U qhs, SSI --modified carb diet  Postpartum Planning -- breast -- Rubella imm/[07/17/18]Tdap  --POPs postpartum  Corliss Blacker, PGY-III Family Medicine    I confirm that I have verified the information documented in the resident's student's note and that I have also personally reperformed the history, physical exam and all medical decision making activities of this service and have verified that all service and findings are accurately documented in this student's note.   [place], [place] 08/17/2018 10:44 AM

## 2018-08-17 NOTE — MAU Note (Signed)
Pt in MAU for covid swab, denies any symptoms

## 2018-08-17 NOTE — Progress Notes (Signed)
OB/GYN Faculty Practice: Labor Progress Note  Subjective: Patient doing well without complaints.  Objective: BP (!) 168/85   Pulse 82   Temp 98 F (36.7 C) (Oral)   Resp 18   Ht 5\' 5"  (1.651 m)   Wt 99.8 kg   LMP  (LMP Unknown)   BMI 36.61 kg/m  Gen: well appearing, NAD Dilation: Fingertip Effacement (%): Thick Station: -3 Presentation: Vertex(by Bedside US) Exam by:: Dr. Sylvester Harder  Assessment and Plan: 48 y.o. G3P2001 [redacted]w[redacted]d here for IOL-cHTN with super imposed preE w/ SF.   Labor: unchanged from last exam. S/p cytotec x1. Will give another dose of cytotec and continue time appropriate cervical exams. Consider FB placement at next check. -- pain control: doing well -- PPH Risk: low  Fetal Well-Being: Cephalic by BSUS.  -- Category 1 - continuous fetal monitoring  -- GBS positive (pcn)  cHTN with superimposed preE w/ SF --labetalol 200 mg BID, continued --patient asymptomatic --PIH labs unremarkable --multiple severe range blood pressures since admission --will initiate labetalol profile --if blood pressures remain elevated, will initiate Mg   DMII --lantus 9U qhs while admitted in place of home NPH --SSI --q4 cbg  Corliss Blacker, PGY-III Family Medicine 3:09 PM

## 2018-08-18 ENCOUNTER — Encounter (HOSPITAL_COMMUNITY): Payer: Self-pay | Admitting: Anesthesiology

## 2018-08-18 ENCOUNTER — Encounter (HOSPITAL_COMMUNITY)
Admission: AD | Disposition: A | Discharge status: Home / Self Care | Payer: Self-pay | Source: Home / Self Care | Attending: Obstetrics & Gynecology

## 2018-08-18 ENCOUNTER — Encounter (HOSPITAL_COMMUNITY): Payer: Self-pay | Admitting: *Deleted

## 2018-08-18 ENCOUNTER — Other Ambulatory Visit: Payer: Self-pay

## 2018-08-18 DIAGNOSIS — O1414 Severe pre-eclampsia complicating childbirth: Secondary | ICD-10-CM

## 2018-08-18 DIAGNOSIS — E119 Type 2 diabetes mellitus without complications: Secondary | ICD-10-CM

## 2018-08-18 DIAGNOSIS — Z7984 Long term (current) use of oral hypoglycemic drugs: Secondary | ICD-10-CM

## 2018-08-18 DIAGNOSIS — O99824 Streptococcus B carrier state complicating childbirth: Secondary | ICD-10-CM

## 2018-08-18 DIAGNOSIS — Z794 Long term (current) use of insulin: Secondary | ICD-10-CM

## 2018-08-18 LAB — GLUCOSE, CAPILLARY
Glucose-Capillary: 96 mg/dL (ref 70–99)
Glucose-Capillary: 138 mg/dL — ABNORMAL HIGH (ref 70–99)
Glucose-Capillary: 157 mg/dL — ABNORMAL HIGH (ref 70–99)
Glucose-Capillary: 136 mg/dL — ABNORMAL HIGH (ref 70–99)
Glucose-Capillary: 226 mg/dL — ABNORMAL HIGH (ref 70–99)

## 2018-08-18 LAB — RPR: RPR Ser Ql: NONREACTIVE

## 2018-08-18 SURGERY — Surgical Case
Anesthesia: Regional

## 2018-08-18 MED ORDER — PRENATAL MULTIVITAMIN CH
1.0000 | ORAL_TABLET | Freq: Every day | ORAL | Status: DC
  Administered 2018-08-18 – 2018-08-20 (×3): 1 via ORAL
  Filled 2018-08-18 (×3): qty 1

## 2018-08-18 MED ORDER — ENALAPRIL MALEATE 10 MG PO TABS
10.0000 mg | ORAL_TABLET | Freq: Every day | ORAL | Status: DC
  Administered 2018-08-19 – 2018-08-20 (×2): 10 mg via ORAL
  Filled 2018-08-18 (×2): qty 1

## 2018-08-18 MED ORDER — BENZOCAINE-MENTHOL 20-0.5 % EX AERO: 1.0000 "application " | INHALATION_SPRAY | CUTANEOUS | Status: DC | PRN

## 2018-08-18 MED ORDER — OXYTOCIN 40 UNITS IN NORMAL SALINE INFUSION - SIMPLE MED
1.0000 m[IU]/min | INTRAVENOUS | Status: DC
  Administered 2018-08-18: 03:00:00 2 m[IU]/min via INTRAVENOUS
  Filled 2018-08-18: qty 1000

## 2018-08-18 MED ORDER — ACETAMINOPHEN 325 MG PO TABS: 650.0000 mg | ORAL_TABLET | ORAL | Status: DC | PRN

## 2018-08-18 MED ORDER — DIPHENHYDRAMINE HCL 25 MG PO CAPS: 25.0000 mg | ORAL_CAPSULE | Freq: Four times a day (QID) | ORAL | Status: DC | PRN

## 2018-08-18 MED ORDER — IBUPROFEN 600 MG PO TABS
600.0000 mg | ORAL_TABLET | Freq: Four times a day (QID) | ORAL | Status: DC
  Administered 2018-08-18 – 2018-08-20 (×9): 600 mg via ORAL
  Filled 2018-08-18 (×9): qty 1

## 2018-08-18 MED ORDER — ONDANSETRON HCL 4 MG/2ML IJ SOLN: 4.0000 mg | INTRAMUSCULAR | Status: DC | PRN

## 2018-08-18 MED ORDER — COCONUT OIL OIL: 1.0000 "application " | TOPICAL_OIL | Status: DC | PRN

## 2018-08-18 MED ORDER — SENNOSIDES-DOCUSATE SODIUM 8.6-50 MG PO TABS
2.0000 | ORAL_TABLET | ORAL | Status: DC
  Administered 2018-08-18 – 2018-08-19 (×2): 2 via ORAL
  Filled 2018-08-18 (×2): qty 2

## 2018-08-18 MED ORDER — SIMETHICONE 80 MG PO CHEW
80.0000 mg | CHEWABLE_TABLET | ORAL | Status: DC | PRN
  Administered 2018-08-19: 80 mg via ORAL

## 2018-08-18 MED ORDER — ONDANSETRON HCL 4 MG PO TABS: 4.0000 mg | ORAL_TABLET | ORAL | Status: DC | PRN

## 2018-08-18 MED ORDER — TERBUTALINE SULFATE 1 MG/ML IJ SOLN
0.2500 mg | Freq: Once | INTRAMUSCULAR | Status: AC | PRN
  Administered 2018-08-18: 0.25 mg via SUBCUTANEOUS

## 2018-08-18 MED ORDER — LACTATED RINGERS IV SOLN
INTRAVENOUS | Status: DC
  Administered 2018-08-18 – 2018-08-19 (×2): via INTRAVENOUS

## 2018-08-18 MED ORDER — FENTANYL CITRATE (PF) 100 MCG/2ML IJ SOLN
100.0000 ug | INTRAMUSCULAR | Status: DC | PRN
  Administered 2018-08-18: 100 ug via INTRAVENOUS
  Filled 2018-08-18: qty 2

## 2018-08-18 MED ORDER — TETANUS-DIPHTH-ACELL PERTUSSIS 5-2.5-18.5 LF-MCG/0.5 IM SUSP: 0.5000 mL | Freq: Once | INTRAMUSCULAR | Status: DC

## 2018-08-18 MED ORDER — MEASLES, MUMPS & RUBELLA VAC IJ SOLR: 0.5000 mL | Freq: Once | INTRAMUSCULAR | Status: DC

## 2018-08-18 MED ORDER — MAGNESIUM SULFATE 40 G IN LACTATED RINGERS - SIMPLE
2.0000 g/h | INTRAVENOUS | Status: DC
  Administered 2018-08-18 – 2018-08-19 (×2): 2 g/h via INTRAVENOUS
  Filled 2018-08-18: qty 500

## 2018-08-18 NOTE — Progress Notes (Signed)
Labor Progress Note  Subjective: Pt doing well. No complaints.   Objective: BP (!) 160/85   Pulse 77   Temp 98.3 F (36.8 C) (Oral)   Resp 18   Ht 5\' 5"  (1.651 m)   Wt 99.8 kg   LMP  (LMP Unknown)   BMI 36.61 kg/m  Gen: alert, oriented, no distress  Dilation: 4 Effacement (%): 70 Cervical Position: Middle Station: -2 Presentation: Vertex Exam by:: Craig Guess, RN  Assessment and Plan: 48 y.o. G3P2001 [redacted]w[redacted]d admitted for IOL for cHTN with superimposed pre-e.   Labor:  -- S/p foley bulb -- Pitocin 2/2 started -- PCN for +GBS -- Mag 2g continuous infusion -- Labetalol 200mg  BID -- Labetalol protocol PRN -- Uncontrolled T2DM on Lantus -- Pain control: none -- PPH Risk: high -- Anticipate NSVD  Fetal Well-Being:  -- Cephalic by VE.  -- Category 1; FHR 120, moderate variability, +accels, no decels -- Continuous fetal monitoring  -- GBS positive   Maryagnes Amos, SNM 4:13 AM

## 2018-08-18 NOTE — Progress Notes (Signed)
Labor Progress  Subjective: Pt doing well. Reports some mild cramping.   Objective: BP 121/90   Pulse 90   Temp 98.3 F (36.8 C) (Oral)   Resp 18   Ht 5\' 5"  (1.651 m)   Wt 99.8 kg   LMP  (LMP Unknown)   BMI 36.61 kg/m  Gen: well-appearing, no distress  Dilation: 1.5 Effacement (%): 50 Cervical Position: Middle Station: Ballotable Presentation: Vertex Exam by:: Bridgette Habermann, CNM  Assessment and Plan: 48 y.o. G3P2001 [redacted]w[redacted]d admitted for IOL for cHTN with superimposed pre-e.   Labor:  -- Foley bulb placed -- 4th dose of cytotec given  -- PCN for +GBS -- Mag 2g continuous infusion -- Labetalol 200mg  BID -- Labetalol protocol PRN -- Uncontrolled T2DM on Lantus -- Pain control: none  -- PPH Risk: high  Fetal Well-Being:  -- EFW 3009gby U/S on 8/6.  -- Cephalic by cervical exam.  -- Category 1; FHR 125, moderate variability, +accels, no decels  -- Continuous fetal monitoring  -- GBS positive   Maryagnes Amos, SNM 12:23 AM

## 2018-08-18 NOTE — Progress Notes (Signed)
Labor Progress Note  Subjective: In to evaluate patient after severe range BPs. Pt denies HA, vision changes, or other severe features. Pt reports increasingly painful contractions, which has improved with IV pain medication.    Objective: BP (!) 169/94   Pulse 82   Temp 98.2 F (36.8 C) (Oral)   Resp 18   Ht 5\' 5"  (1.651 m)   Wt 99.8 kg   LMP  (LMP Unknown)   BMI 36.61 kg/m  Gen: alert, oriented  Dilation: 6 Effacement (%): 80 Cervical Position: Middle Station: -2 Presentation: Vertex Exam by:: Craig Guess, RN  Assessment and Plan: 48 y.o. G3P2001 [redacted]w[redacted]d admitted for IOL for cHTN with superimposed pre-e. Pt given 40mg  IV labetalol for severe range BPs, which brought BP down to 150s/80s  Labor:  -- Continue Pitocin 2/2 -- 2g mag sulfate continuous infusion -- Labetalol protocol PRN -- Labetalol 200mg  BID  -- PCN for +GBS -- Lantus for T2DM -- Pain control: IV pain medication -- PPH Risk: high  Fetal Well-Being:  -- Cephalic by VE.  -- Category 1; FHR 120, moderate variability, +accels, no decels -- Continuous fetal monitoring    Maryagnes Amos, SNM 7:21 AM

## 2018-08-18 NOTE — Progress Notes (Addendum)
Inpatient Diabetes Program Recommendations  AACE/ADA: New Consensus Statement on Inpatient Glycemic Control (2015)  Target Ranges:  Prepandial:   less than 140 mg/dL      Peak postprandial:   less than 180 mg/dL (1-2 hours)      Critically ill patients:  140 - 180 mg/dL   Lab Results  Component Value Date   GLUCAP 157 (H) 08/18/2018   HGBA1C 9.1 (H) 07/04/2018    Review of Glycemic Control Results for [name], [name] (MRN [id number]) as of 08/18/2018 14:56  Ref. Range 08/17/2018 22:42 08/18/2018 06:18 08/18/2018 08:02 08/18/2018 13:49  Glucose-Capillary Latest Ref Range: 70 - 99 mg/dL 105 (H) 96 138 (H) 157 (H)   Diabetes history: Type 2 DM Outpatient Diabetes medications: NPH 18 units QAM, 12 units QPM, Metformin 1000 mg BID Current orders for Inpatient glycemic control: Lantus 9 units QHS, Novolog 0-15 units TID  Inpatient Diabetes Program Recommendations:     Met with patient regarding outpatient diabetes management. Verified home medications above. Patient was on Glyburide prior to pregnancy. Reviewed patient's current A1c of 9.1%. Explained what a A1c is and what it measures. Discussed reliability peripartum and would recommend having a repeat within 3 months. Also reviewed goal A1c with patient, importance of good glucose control @ home, and blood sugar goals. Reviewed patho of DM, need for insulin, role of pancreas, insulin resistance in pregnancy, what to expect following delivery, vascular changes and commordbities associated with poor glycemic control.  Patient has a meter and testing supplies. Has been checking CBGs 4 times per day. Encouraged to continue checking and writing down values. Discussed that this information will help continue to management blood sugars.  Denies drinking sugary beverages and has recently been more mindful of carbohydrates that are in foods. Discussed carb counting, continuing to be mindful and encouraged the use of the plate method. Patient has been  followed for DM at the [facility]. Husband is interested in continuing care there as well as using this pharmacy for outpatient prescriptions. In preparation for discharge, could consider adding Metformin 500 mg BID and Glyburide 5 mg BID. Will place CM consult. Patient and spouse have no further questions at this time, plans to follow up.   Thanks, Bronson Curb, MSN, RNC-OB Diabetes Coordinator [phone] (8a-5p)

## 2018-08-18 NOTE — Discharge Summary (Signed)
Postpartum Discharge Summary    Patient Name: [name] DOB: 09-04-1970 MRN: [id number]  Date of admission: 08/17/2018 Delivering Provider: Verita Schneiders A   Date of discharge: 08/20/2018  Admitting diagnosis: pregnancy Intrauterine pregnancy: 110w2d    Secondary diagnosis:  Principal Problem:   Severe preclampsia superimposed on chronic hypertension, delivered Active Problems:   Supervision of high risk pregnancy, antepartum   Chronic hypertension in pregnancy   Type 2 diabetes mellitus affecting pregnancy, antepartum   SVD (spontaneous vaginal delivery)  Additional problems: late PNC, CHTN, T2DM, obesity, language barrier     Discharge diagnosis: Term Pregnancy Delivered, CHTN with superimposed preeclampsia and Type 2 DM                                                                                                Post partum procedures: Magnesium sulfate  Augmentation: Pitocin, Cytotec and Foley Balloon  Complications: None  Hospital course:  Induction of Labor With Vaginal Delivery   48y.o. yo G3P2001 at 371w2das admitted to the hospital 08/17/2018 for induction of labor.  Indication for induction: Preeclampsia and TYPE 2 DM.  Patient had an uncomplicated labor course as follows: Membrane Rupture Time/Date: 8:20 AM ,08/18/2018   Intrapartum Procedures: Episiotomy: None [1]                                         Lacerations:  None [1]  Patient had delivery of a Viable infant on 08/18/2018.  Information for the patient's newborn:  MoMariajose, Mow0[[id number]]Delivery Method: Vag-Spont  Details of delivery can be found in separate delivery note.  Patient received magnesium sulfate for eclampsia prophylaxis as per protocol intrapartum and postpartum. BP control was obtained with Enalapril, no further immediate complications.  Otherwise, patient had a routine postpartum course. Patient is discharged home 08/20/18.  Magnesium Sulfate recieved: Yes BMZ received:  No  Physical exam  Vitals:   08/19/18 2347 08/20/18 0558 08/20/18 0826 08/20/18 1153  BP: (!) 143/69 (!) 153/79 (!) 153/85 138/75  Pulse: 75 79 77 77  Resp: 20 18 20 18   Temp: 98 F (36.7 C) 98.2 F (36.8 C) 98.7 F (37.1 C) 98.5 F (36.9 C)  TempSrc: Oral Oral Oral Oral  SpO2: 100% 100% 99% 100%  Weight:      Height:       General: alert, cooperative and no distress Lochia: appropriate Uterine Fundus: firm Incision: N/A DVT Evaluation: No evidence of DVT seen on physical exam. Negative Homan's sign. No cords or calf tenderness. No significant calf/ankle edema. Labs: Lab Results  Component Value Date   WBC 6.8 08/17/2018   HGB 13.6 08/17/2018   HCT 40.6 08/17/2018   MCV 79.8 (L) 08/17/2018   PLT 322 08/17/2018   CMP Latest Ref Rng & Units 08/17/2018  Glucose 70 - 99 mg/dL 252(H)  BUN 6 - 20 mg/dL 9  Creatinine 0.44 - 1.00 mg/dL 0.58  Sodium 135 - 145 mmol/L 132(L)  Potassium 3.5 - 5.1  mmol/L 4.6  Chloride 98 - 111 mmol/L 103  CO2 22 - 32 mmol/L 18(L)  Calcium 8.9 - 10.3 mg/dL 9.8  Total Protein 6.5 - 8.1 g/dL 6.2(L)  Total Bilirubin 0.3 - 1.2 mg/dL 0.2(L)  Alkaline Phos 38 - 126 U/L 76  AST 15 - 41 U/L 33  ALT 0 - 44 U/L 29    Discharge instruction: per After Visit Summary and "Baby and Me Booklet".  After visit meds:  Allergies as of 08/20/2018   No Known Allergies     Medication List    STOP taking these medications   acetaminophen 325 MG tablet Commonly known as: TYLENOL   amoxicillin 500 MG capsule Commonly known as: AMOXIL   insulin NPH Human 100 UNIT/ML injection Commonly known as: NOVOLIN N   Insulin Syringe-Needle U-100 30G X 5/16" 0.5 ML Misc Commonly known as: Safety Insulin Syringes   labetalol 200 MG tablet Commonly known as: NORMODYNE   terconazole 0.8 % vaginal cream Commonly known as: Terazol 3   True Metrix Blood Glucose Test test strip Generic drug: glucose blood   True Metrix Meter w/Device Kit   TRUEplus Lancets 28G  Misc     TAKE these medications   enalapril 20 MG tablet Commonly known as: VASOTEC Take 1 tablet (20 mg total) by mouth daily. Start taking on: August 21, 2018   ibuprofen 600 MG tablet Commonly known as: ADVIL Take 1 tablet (600 mg total) by mouth every 6 (six) hours as needed for mild pain, moderate pain or cramping.   metFORMIN 500 MG tablet Commonly known as: GLUCOPHAGE Take 1 tablet (500 mg total) by mouth 2 (two) times daily with a meal. What changed:   medication strength  how much to take   multivitamin-prenatal 27-0.8 MG Tabs tablet Take 1 tablet by mouth daily at 12 noon.   senna-docusate 8.6-50 MG tablet Commonly known as: Senokot-S Take 2 tablets by mouth at bedtime as needed for mild constipation or moderate constipation.       Diet: routine diet  Activity: Advance as tolerated. Pelvic rest for 6 weeks.   Follow up Visit: [facility] Follow up in 1 week(s).   Specialty: Obstetrics and Gynecology Why: BP Check Contact information: [address] [place], [place] [id number] [place] [address] [phone]          Please schedule this patient for Postpartum visit in: 6 weeks with the following provider: MD For C/S patients schedule nurse incision check in weeks 2 weeks: no High risk pregnancy complicated by: HTN, V6HY Delivery mode:  SVD Anticipated Birth Control:  POPs PP Procedures needed: BP check  Schedule Integrated BH visit: no   Newborn Data: Live born female  Birth Weight: 5 lb 6.4 oz (2450 g) APGAR: 8, 9  Newborn Delivery   Birth date/time: 08/18/2018 09:03:00 Delivery type: Vaginal, Spontaneous      Baby Feeding: Bottle and Breast Disposition:home with mother   08/20/2018 Verita Schneiders, MD

## 2018-08-19 DIAGNOSIS — E119 Type 2 diabetes mellitus without complications: Secondary | ICD-10-CM

## 2018-08-19 DIAGNOSIS — Z7984 Long term (current) use of oral hypoglycemic drugs: Secondary | ICD-10-CM

## 2018-08-19 DIAGNOSIS — O1414 Severe pre-eclampsia complicating childbirth: Secondary | ICD-10-CM

## 2018-08-19 DIAGNOSIS — Z794 Long term (current) use of insulin: Secondary | ICD-10-CM

## 2018-08-19 DIAGNOSIS — O99824 Streptococcus B carrier state complicating childbirth: Secondary | ICD-10-CM

## 2018-08-19 LAB — GLUCOSE, CAPILLARY
Glucose-Capillary: 118 mg/dL — ABNORMAL HIGH (ref 70–99)
Glucose-Capillary: 76 mg/dL (ref 70–99)
Glucose-Capillary: 131 mg/dL — ABNORMAL HIGH (ref 70–99)
Glucose-Capillary: 140 mg/dL — ABNORMAL HIGH (ref 70–99)
Glucose-Capillary: 76 mg/dL (ref 70–99)

## 2018-08-19 MED ORDER — METFORMIN HCL 500 MG PO TABS
500.0000 mg | ORAL_TABLET | Freq: Two times a day (BID) | ORAL | Status: DC
  Administered 2018-08-19 – 2018-08-20 (×3): 500 mg via ORAL
  Filled 2018-08-19 (×3): qty 1

## 2018-08-19 MED ORDER — METFORMIN HCL 500 MG PO TABS: 500.0000 mg | ORAL_TABLET | Freq: Every day | ORAL | Status: DC

## 2018-08-19 NOTE — Progress Notes (Signed)
Patient's BS is 79. Pt's breakfast was just delivered. Toya Smothers, RN

## 2018-08-19 NOTE — Lactation Note (Signed)
This note was copied from a baby's chart. Lactation Consultation Note  Patient Name: Desiree Cardell PeachRakia Ressel ZOXWR'UToday's Date: 08/19/2018 Reason for consult: Initial assessment;Infant < 6lbs;Early term 37-38.6wks  P3, 14 hour female infant, ETI. LC entered room, mom voiced in English she did not want use Interpreter but her husband instead. Husband interpreted  and mom spoke back in English to [facility] during breastfeeding services with mom. Infant had 2 voids, LC changed  one void diaper while in room, infant has not stool at this time. Mom is experienced at breastfeeding she breastfeed her two other children for 9 months and mom has breast pump at home. Mom is not active or on the  [facility] at this time.  Mom's feeding choice at admission was breast and bottle feeding. Mom was given DEBP by Nurse and [facility] reviewed with mom how to use DEBP. Per mom , she has made 3 attempts to latching infant to breast and she is supplementing with Similac Neosure 22 kcal after breastfeeding infant.  Mom attempted to latch infant on left breast using cross cradled hold infant suckled a few times then stop only holding breast in his mouth -2 minutes.  Mom will continue to work towards latching infant to breast. LC observed in breast assessment mom has flat nipples, mom will wear breast shells in bra during the day and will use hand pump to pre-pump breast prior to latching infant to breast. Mom shown how to use DEBP & how to disassemble, clean, & reassemble parts. Mom knows to call Nurse or [facility] for latch assistance if needed. Infant was supplemented with 7 ml of Similac Neosure 22 kcal using slow flow bottle nipple. Mom pumped afterwards with DEBP, mom know to pump every 3 hours for 15 minutes Mom knows to latch infant according hunger cues, 8 to 12 times within 24 hours and on demand. Mom will continue to do STS as much as possible. Reviewed Baby & Me book's Breastfeeding Basics.  Mom made aware of O/P services, breastfeeding  support groups, community resources, and our phone # for post-discharge questions.  Mom;s plans: 1. Mom will breastfeed according hunger cues, 8 to12 times within 24 hours, on demand. 2. Mom will ask for latch assistance if needed by [facility] or Nurse. 3. Mom will wear breast shells in bra during the day and will pre-pump with hand pump prior to latching infant to breast. 4. Mom will use DEBP every 3 hours and give infant back EBM first, before offering Similac Neosure 22 kcal with iron, infant will be supplemented based on age/ hours of life after each feeding. 5. Mom will continue to do as much STS as possible. Maternal Data Formula Feeding for Exclusion: Yes Reason for exclusion: Mother's choice to formula and breast feed on admission Has patient been taught Hand Expression?: Yes Does the patient have breastfeeding experience prior to this delivery?: Yes  Feeding Feeding Type: Breast Fed Nipple Type: Slow - flow  LATCH Score Latch: Repeated attempts needed to sustain latch, nipple held in mouth throughout feeding, stimulation needed to elicit sucking reflex.  Audible Swallowing: None  Type of Nipple: Flat  Comfort (Breast/Nipple): Soft / non-tender  Hold (Positioning): Assistance needed to correctly position infant at breast and maintain latch.  LATCH Score: 5  Interventions Interventions: Breast feeding basics reviewed;Breast compression;Assisted with latch;Adjust position;Skin to skin;Support pillows;DEBP;Shells;Pre-pump if needed;Hand express;Breast massage;Position options  Lactation Tools Discussed/Used Tools: Shells;Pump Breast pump type: Double-Electric Breast Pump WIC Program: No Initiated by:: breast stimulation and  induction, has flat nipples Date initiated:: 08/18/18   Consult Status Consult Status: Follow-up Date: 08/19/18 Follow-up type: In-patient    Vicente Serene 08/19/2018, 12:01 AM

## 2018-08-19 NOTE — Progress Notes (Signed)
Post Partum Day 1 Subjective: no complaints, up ad lib, voiding and tolerating PO  Objective: Blood pressure (!) 150/82, pulse 79, temperature 98.4 F (36.9 C), temperature source Oral, resp. rate 18, height 5\' 5"  (1.651 m), weight 99.8 kg, SpO2 98 %, unknown if currently breastfeeding.  Physical Exam:  General: alert, cooperative and no distress Lochia: appropriate Uterine Fundus: firm DVT Evaluation: No evidence of DVT seen on physical exam. Negative Homan's sign. No cords or calf tenderness. No significant calf/ankle edema.  Recent Labs    08/17/18 0758  HGB 13.6  HCT 40.6    Assessment/Plan: Plan for discharge tomorrow and Breastfeeding  Start metformin 500mg  BID Start vasotec.  D/c mag.   LOS: 2 days   Truett Mainland 08/19/2018, 9:36 AM

## 2018-08-19 NOTE — Clinical Social Work Maternal (Signed)
CLINICAL SOCIAL WORK MATERNAL/CHILD NOTE  Patient Details  Name: [name] MRN: [id number] Date of Birth: [birth year]  Date:  2018/03/11  Clinical Social Worker Initiating Note:  Durward Fortes, LCSW Date/Time: Initiated:  08/19/18/0145     Child's Name:  [name]   Biological Parents:  Mother, Father   Need for Interpreter:  None   Reason for Referral:  Late or No Prenatal Care    Address:  [address] [place] [address]    Phone number:  [phone] (home)     Additional phone number:none   Household Members/Support Persons (HM/SP):   Household Member/Support Person 2   HM/SP Name Relationship DOB or Age  HM/SP -1   [name] (FOB)  FOB   [birth year]/1962  HM/SP -2 [name] (MOB) MOB  11/21/1970  HM/SP -3   [name] (sibling)  sibling   12/14/2016  HM/SP -4        HM/SP -5        HM/SP -6        HM/SP -7        HM/SP -8          Natural Supports (not living in the home):  Extended Family   Professional Supports: None   Employment: Unemployed   Type of Work: none   Education:  Other (comment)(MOB schooled in [place]   Homebound arranged:  n/a  Museum/gallery curator Resources:    none reported   Other Resources:  WIC(pl[facility]   Cultural/Religious Considerations Which May Impact Care:  none reported.   Strengths:  Ability to meet basic needs , Compliance with medical plan , Home prepared for child    Psychotropic Medications:         Pediatrician:       Pediatrician List:   [facility]      Pediatrician Fax Number:    Risk Factors/Current Problems:  None   Cognitive State:  Able to Concentrate , Alert    Mood/Affect:  Calm    CSW Assessment: CSW consulted as MOB had limited PNC during pregnancy. CSW went to speak with MOB at bedside.   CSW congratulated MOB and FOB in the birth of infant. CSW began to set up interpretor  services and MOB requested that FOB speak for her. CSW understanding and advised MOB and FOB of the reason for the visit. MOB and FOB reported that MOB did get care but not until 26 weeks. CSW advised MOB and FOB of the hospital drug screen policy and advised them that infants UDS was negative. CSW informed them that CSW would continue to monitor infants CDS for further needs. MOB and FOB both very understanding with no questions regarding policy at this time.    MOB reported that she has no mental health diagnosis and has never used any substances. MOB and FOB reported that they have a 46 year old at home at this time and have support from MOB's sister. [place]OB is from [place] where FOB reported that MOB lost oldest child due to Malaria. CSW offered condolences to MOB and FOB. CSW began to explained PPD and SIDS to MOB and FOB. MOB began to look tired or some sort. CSW notified that MOB doesn't read English therefore providing MOB with PPD Checklist wouldn't be helpful. CSW provided FOB With signs to look for with  MOB in the event that PPD began.  MOB and FOB report having all needed items to care for infant at this time with no other concerns to CSW. MOB cored 3 on [place] Depression with no concerns to CSW.   CSW Plan/Description:  No Further Intervention Required/No Barriers to Discharge, Sudden Infant Death Syndrome (SIDS) Education, Perinatal Mood and Anxiety Disorder (PMADs) Education, Hospital Drug Screen Policy Information, CSW Will Continue to Monitor Umbilical Cord Tissue Drug Screen Results and Make Report if Warranted    Tajuanna Burnett S Latondra Gebhart, LCSWA 08/19/2018, 2:13 PM  

## 2018-08-20 LAB — GLUCOSE, CAPILLARY
Glucose-Capillary: 88 mg/dL (ref 70–99)
Glucose-Capillary: 145 mg/dL — ABNORMAL HIGH (ref 70–99)
Glucose-Capillary: 76 mg/dL (ref 70–99)

## 2018-08-20 MED ORDER — METFORMIN HCL 500 MG PO TABS: 500.0000 mg | ORAL_TABLET | Freq: Two times a day (BID) | ORAL | 2 refills | Status: DC

## 2018-08-20 MED ORDER — ENALAPRIL MALEATE 20 MG PO TABS: 20.0000 mg | ORAL_TABLET | Freq: Every day | ORAL | 2 refills | Status: DC

## 2018-08-20 MED ORDER — SENNOSIDES-DOCUSATE SODIUM 8.6-50 MG PO TABS: 2.0000 | ORAL_TABLET | Freq: Every evening | ORAL | 2 refills | Status: DC | PRN

## 2018-08-20 MED ORDER — IBUPROFEN 600 MG PO TABS: 600.0000 mg | ORAL_TABLET | Freq: Four times a day (QID) | ORAL | 2 refills | Status: DC | PRN

## 2018-08-20 MED FILL — ENALAPRIL MALEATE 20 MG TAB: 20 | 30 days supply | Qty: 30 | Fill #0

## 2018-08-20 MED FILL — IBUPROFEN 600 MG TABLET: 600 | 8 days supply | Qty: 30 | Fill #0

## 2018-08-20 MED FILL — metFORMIN HCL 500 MG TABS: 500 | 30 days supply | Qty: 60 | Fill #0

## 2018-08-20 MED FILL — SENNA S 8.6-50 MG TABS: 8.6-50 | 15 days supply | Qty: 30 | Fill #0

## 2018-08-20 NOTE — Discharge Instructions (Signed)
Postpartum Care After Vaginal Delivery °This sheet gives you information about how to care for yourself from the time you deliver your baby to up to 6-12 weeks after delivery (postpartum period). Your health care provider may also give you more specific instructions. If you have problems or questions, contact your health care provider. °Follow these instructions at home: °Vaginal bleeding °· It is normal to have vaginal bleeding (lochia) after delivery. Wear a sanitary pad for vaginal bleeding and discharge. °? During the first week after delivery, the amount and appearance of lochia is often similar to a menstrual period. °? Over the next few weeks, it will gradually decrease to a dry, yellow-brown discharge. °? For most women, lochia stops completely by 4-6 weeks after delivery. Vaginal bleeding can vary from woman to woman. °· Change your sanitary pads frequently. Watch for any changes in your flow, such as: °? A sudden increase in volume. °? A change in color. °? Large blood clots. °· If you pass a blood clot from your vagina, save it and call your health care provider to discuss. Do not flush blood clots down the toilet before talking with your health care provider. °· Do not use tampons or douches until your health care provider says this is safe. °· If you are not breastfeeding, your period should return 6-8 weeks after delivery. If you are feeding your child breast milk only (exclusive breastfeeding), your period may not return until you stop breastfeeding. °Perineal care °· Keep the area between the vagina and the anus (perineum) clean and dry as told by your health care provider. Use medicated pads and pain-relieving sprays and creams as directed. °· If you had a cut in the perineum (episiotomy) or a tear in the vagina, check the area for signs of infection until you are healed. Check for: °? More redness, swelling, or pain. °? Fluid or blood coming from the cut or tear. °? Warmth. °? Pus or a bad  smell. °· You may be given a squirt bottle to use instead of wiping to clean the perineum area after you go to the bathroom. As you start healing, you may use the squirt bottle before wiping yourself. Make sure to wipe gently. °· To relieve pain caused by an episiotomy, a tear in the vagina, or swollen veins in the anus (hemorrhoids), try taking a warm sitz bath 2-3 times a day. A sitz bath is a warm water bath that is taken while you are sitting down. The water should only come up to your hips and should cover your buttocks. °Breast care °· Within the first few days after delivery, your breasts may feel heavy, full, and uncomfortable (breast engorgement). Milk may also leak from your breasts. Your health care provider can suggest ways to help relieve the discomfort. Breast engorgement should go away within a few days. °· If you are breastfeeding: °? Wear a bra that supports your breasts and fits you well. °? Keep your nipples clean and dry. Apply creams and ointments as told by your health care provider. °? You may need to use breast pads to absorb milk that leaks from your breasts. °? You may have uterine contractions every time you breastfeed for up to several weeks after delivery. Uterine contractions help your uterus return to its normal size. °? If you have any problems with breastfeeding, work with your health care provider or lactation consultant. °· If you are not breastfeeding: °? Avoid touching your breasts a lot. Doing this can make   your breasts produce more milk. °? Wear a good-fitting bra and use cold packs to help with swelling. °? Do not squeeze out (express) milk. This causes you to make more milk. °Intimacy and sexuality °· Ask your health care provider when you can engage in sexual activity. This may depend on: °? Your risk of infection. °? How fast you are healing. °? Your comfort and desire to engage in sexual activity. °· You are able to get pregnant after delivery, even if you have not had  your period. If desired, talk with your health care provider about methods of birth control (contraception). °Medicines °· Take over-the-counter and prescription medicines only as told by your health care provider. °· If you were prescribed an antibiotic medicine, take it as told by your health care provider. Do not stop taking the antibiotic even if you start to feel better. °Activity °· Gradually return to your normal activities as told by your health care provider. Ask your health care provider what activities are safe for you. °· Rest as much as possible. Try to rest or take a nap while your baby is sleeping. °Eating and drinking ° °· Drink enough fluid to keep your urine pale yellow. °· Eat high-fiber foods every day. These may help prevent or relieve constipation. High-fiber foods include: °? Whole grain cereals and breads. °? Brown rice. °? Beans. °? Fresh fruits and vegetables. °· Do not try to lose weight quickly by cutting back on calories. °· Take your prenatal vitamins until your postpartum checkup or until your health care provider tells you it is okay to stop. °Lifestyle °· Do not use any products that contain nicotine or tobacco, such as cigarettes and e-cigarettes. If you need help quitting, ask your health care provider. °· Do not drink alcohol, especially if you are breastfeeding. °General instructions °· Keep all follow-up visits for you and your baby as told by your health care provider. Most women visit their health care provider for a postpartum checkup within the first 3-6 weeks after delivery. °Contact a health care provider if: °· You feel unable to cope with the changes that your child brings to your life, and these feelings do not go away. °· You feel unusually sad or worried. °· Your breasts become red, painful, or hard. °· You have a fever. °· You have trouble holding urine or keeping urine from leaking. °· You have little or no interest in activities you used to enjoy. °· You have not  breastfed at all and you have not had a menstrual period for 12 weeks after delivery. °· You have stopped breastfeeding and you have not had a menstrual period for 12 weeks after you stopped breastfeeding. °· You have questions about caring for yourself or your baby. °· You pass a blood clot from your vagina. °Get help right away if: °· You have chest pain. °· You have difficulty breathing. °· You have sudden, severe leg pain. °· You have severe pain or cramping in your lower abdomen. °· You bleed from your vagina so much that you fill more than one sanitary pad in one hour. Bleeding should not be heavier than your heaviest period. °· You develop a severe headache. °· You faint. °· You have blurred vision or spots in your vision. °· You have bad-smelling vaginal discharge. °· You have thoughts about hurting yourself or your baby. °If you ever feel like you may hurt yourself or others, or have thoughts about taking your own life, get help   right away. You can go to the nearest emergency department or call: °· Your local emergency services (911 in the [place] °· A suicide crisis helpline, such as the National Suicide Prevention Lifeline at 1-800-273-8255. This is open 24 hours a day. °Summary °· The period of time right after you deliver your newborn up to 6-12 weeks after delivery is called the postpartum period. °· Gradually return to your normal activities as told by your health care provider. °· Keep all follow-up visits for you and your baby as told by your health care provider. °This information is not intended to replace advice given to you by your health care provider. Make sure you discuss any questions you have with your health care provider. °Document Released: 10/22/2006 Document Revised: 12/28/2016 Document Reviewed: 10/08/2016 °Elsevier Patient Education © 2020 [employer] ° ° ° °Postpartum Hypertension °Postpartum hypertension is high blood pressure that remains higher than normal after childbirth. You  may not realize that you have postpartum hypertension if your blood pressure is not being checked regularly. In most cases, postpartum hypertension will go away on its own, usually within a week of delivery. However, for some women, medical treatment is required to prevent serious complications, such as seizures or stroke. °What are the causes? °This condition may be caused by one or more of the following: °· Hypertension that existed before pregnancy (chronic hypertension). °· Hypertension that comes on as a result of pregnancy (gestational hypertension). °· Hypertensive disorders during pregnancy (preeclampsia) or seizures in women who have high blood pressure during pregnancy (eclampsia). °· A condition in which the liver, platelets, and red blood cells are damaged during pregnancy (HELLP syndrome). °· A condition in which the thyroid produces too much hormones (hyperthyroidism). °· Other rare problems of the nerves (neurological disorders) or blood disorders. °In some cases, the cause may not be known. °What increases the risk? °The following factors may make you more likely to develop this condition: °· Chronic hypertension. In some cases, this may not have been diagnosed before pregnancy. °· Obesity. °· Type 2 diabetes. °· Kidney disease. °· History of preeclampsia or eclampsia. °· Other medical conditions that change the level of hormones in the body (hormonal imbalance). °What are the signs or symptoms? °As with all types of hypertension, postpartum hypertension may not have any symptoms. Depending on how high your blood pressure is, you may experience: °· Headaches. These may be mild, moderate, or severe. They may also be steady, constant, or sudden in onset (thunderclap headache). °· Changes in your ability to see (visual changes). °· Dizziness. °· Shortness of breath. °· Swelling of your hands, feet, lower legs, or face. In some cases, you may have swelling in more than one of these locations. °· Heart  palpitations or a racing heartbeat. °· Difficulty breathing while lying down. °· Decrease in the amount of urine that you pass. °Other rare signs and symptoms may include: °· Sweating more than usual. This lasts longer than a few days after delivery. °· Chest pain. °· Sudden dizziness when you get up from sitting or lying down. °· Seizures. °· Nausea or vomiting. °· Abdominal pain. °How is this diagnosed? °This condition may be diagnosed based on the results of a physical exam, blood pressure measurements, and blood and urine tests. °You may also have other tests, such as a CT scan or an MRI, to check for other problems of postpartum hypertension. °How is this treated? °If blood pressure is high enough to require treatment, your options may include: °·   Medicines to reduce blood pressure (antihypertensives). Tell your health care provider if you are breastfeeding or if you plan to breastfeed. There are many antihypertensive medicines that are safe to take while breastfeeding. °· Stopping medicines that may be causing hypertension. °· Treating medical conditions that are causing hypertension. °· Treating the complications of hypertension, such as seizures, stroke, or kidney problems. °Your health care provider will also continue to monitor your blood pressure closely until it is within a safe range for you. °Follow these instructions at home: °· Take over-the-counter and prescription medicines only as told by your health care provider. °· Return to your normal activities as told by your health care provider. Ask your health care provider what activities are safe for you. °· Do not use any products that contain nicotine or tobacco, such as cigarettes and e-cigarettes. If you need help quitting, ask your health care provider. °· Keep all follow-up visits as told by your health care provider. This is important. °Contact a health care provider if: °· Your symptoms get worse. °· You have new symptoms, such as: °? A  headache that does not get better. °? Dizziness. °? Visual changes. °Get help right away if: °· You suddenly develop swelling in your hands, ankles, or face. °· You have sudden, rapid weight gain. °· You develop difficulty breathing, chest pain, racing heartbeat, or heart palpitations. °· You develop severe pain in your abdomen. °· You have any symptoms of a stroke. "BE FAST" is an easy way to remember the main warning signs of a stroke: °? B - Balance. Signs are dizziness, sudden trouble walking, or loss of balance. °? E - Eyes. Signs are trouble seeing or a sudden change in vision. °? F - Face. Signs are sudden weakness or numbness of the face, or the face or eyelid drooping on one side. °? A - Arms. Signs are weakness or numbness in an arm. This happens suddenly and usually on one side of the body. °? S - Speech. Signs are sudden trouble speaking, slurred speech, or trouble understanding what people say. °? T - Time. Time to call emergency services. Write down what time symptoms started. °· You have other signs of a stroke, such as: °? A sudden, severe headache with no known cause. °? Nausea or vomiting. °? Seizure. °These symptoms may represent a serious problem that is an emergency. Do not wait to see if the symptoms will go away. Get medical help right away. Call your local emergency services (911 in the [place] Do not drive yourself to the hospital. °Summary °· Postpartum hypertension is high blood pressure that remains higher than normal after childbirth. °· In most cases, postpartum hypertension will go away on its own, usually within a week of delivery. °· For some women, medical treatment is required to prevent serious complications, such as seizures or stroke. °This information is not intended to replace advice given to you by your health care provider. Make sure you discuss any questions you have with your health care provider. °Document Released: 08/28/2013 Document Revised: 01/31/2018 Document  Reviewed: 10/15/2016 °Elsevier Patient Education © 2020 Elsevier Inc. ° °

## 2018-08-20 NOTE — Lactation Note (Addendum)
This note was copied from a baby's chart. Lactation Consultation Note  Patient Name: [name] [id number] Date: 08/20/2018    P2, 9 hours female infant, weight loss -3%. Interpreter used. Infant is breastfeeding and supplementing with Similac 22 kcal Neosure with iron. Per mom, she is latching infant to breast first and then supplementing with formula.  Mom has not been wearing breast shells as previously advised by LC. LC encourage mom to use DEBP to help with breastfeeding stimulation and induction.  Mom will work towards latching infant at each feeding and supplementing with formula afterwards. LC did not observe latch at this time due mom feeding infant prior to [facility] entering the room.  Maternal Data    Feeding Feeding Type: Bottle Fed - Formula  LATCH Score                   Interventions    Lactation Tools Discussed/Used     Consult Status      Vicente Serene 08/20/2018, 7:46 AM

## 2018-08-20 NOTE — Progress Notes (Signed)
RN gave pt ed[place] to complete. Pt denied wanting to complete due to needing to leave and be at an appointment.

## 2018-08-21 ENCOUNTER — Other Ambulatory Visit: Payer: Self-pay

## 2018-08-21 ENCOUNTER — Encounter: Payer: Self-pay | Admitting: Family Medicine

## 2018-08-26 ENCOUNTER — Telehealth: Payer: Self-pay | Admitting: Family Medicine

## 2018-08-26 NOTE — Telephone Encounter (Signed)
Spoke to patient about her appointment on 8/19 @ 9:40. Patient instructed to wear a face mask for the entire appointment and no visitors are allowed with her during the visit. Patient screened for covid symptoms and denied having any

## 2018-08-27 ENCOUNTER — Ambulatory Visit (INDEPENDENT_AMBULATORY_CARE_PROVIDER_SITE_OTHER): Payer: Self-pay

## 2018-08-27 ENCOUNTER — Ambulatory Visit: Payer: Self-pay

## 2018-08-27 ENCOUNTER — Other Ambulatory Visit: Payer: Self-pay

## 2018-08-27 VITALS — BP 148/90

## 2018-08-27 DIAGNOSIS — Z0130 Encounter for examination of blood pressure without abnormal findings: Secondary | ICD-10-CM

## 2018-08-27 NOTE — Progress Notes (Signed)
Pt here today for BP check.  Pt denies any HTN sx's.  Pt reports that she takes Vasotec 20 mg po tablet daily and she states that she takes it at 1800 daily.  BP LA 148/90 manually.  Reported BP to Dr. Hulan Fray- no new orders.  Pt advised to continue to take Vasotec 20 mg tablet as prescribed, continue to monitor for HTN sx's, and that we will follow up with her at her pp visit appt on 09/18/18.  Pt verbalized understanding.    Mel Almond, RN 08/27/18

## 2018-08-28 ENCOUNTER — Encounter: Payer: Self-pay | Admitting: Obstetrics & Gynecology

## 2018-08-28 ENCOUNTER — Other Ambulatory Visit: Payer: Self-pay

## 2018-08-28 NOTE — Progress Notes (Signed)
I have reviewed this chart and agree with the RN/CMA assessment and management.    Jule Schlabach C Macdonald Rigor, MD, FACOG Attending Physician, [facility]  

## 2018-09-04 ENCOUNTER — Encounter: Payer: Self-pay | Admitting: Obstetrics & Gynecology

## 2018-09-04 ENCOUNTER — Other Ambulatory Visit: Payer: Self-pay

## 2018-09-18 ENCOUNTER — Ambulatory Visit: Payer: Self-pay | Admitting: Obstetrics and Gynecology

## 2018-09-30 ENCOUNTER — Ambulatory Visit: Payer: Self-pay | Admitting: Nurse Practitioner

## 2018-09-30 ENCOUNTER — Other Ambulatory Visit: Payer: Self-pay

## 2018-09-30 ENCOUNTER — Ambulatory Visit: Payer: Self-pay | Admitting: Student

## 2018-09-30 DIAGNOSIS — O10919 Unspecified pre-existing hypertension complicating pregnancy, unspecified trimester: Secondary | ICD-10-CM

## 2018-09-30 DIAGNOSIS — I10 Essential (primary) hypertension: Secondary | ICD-10-CM

## 2018-09-30 DIAGNOSIS — E119 Type 2 diabetes mellitus without complications: Secondary | ICD-10-CM

## 2018-09-30 DIAGNOSIS — L02818 Cutaneous abscess of other sites: Secondary | ICD-10-CM

## 2018-09-30 DIAGNOSIS — Z30011 Encounter for initial prescription of contraceptive pills: Secondary | ICD-10-CM

## 2018-09-30 DIAGNOSIS — O24119 Pre-existing diabetes mellitus, type 2, in pregnancy, unspecified trimester: Secondary | ICD-10-CM

## 2018-09-30 MED ORDER — METFORMIN HCL 500 MG PO TABS: 500.0000 mg | ORAL_TABLET | Freq: Two times a day (BID) | ORAL | 2 refills | Status: DC

## 2018-09-30 MED ORDER — ENALAPRIL MALEATE 20 MG PO TABS: 20.0000 mg | ORAL_TABLET | Freq: Every day | ORAL | 2 refills | Status: DC

## 2018-09-30 MED ORDER — CEPHALEXIN 500 MG PO CAPS: 500.0000 mg | ORAL_CAPSULE | Freq: Four times a day (QID) | ORAL | 0 refills | Status: DC

## 2018-09-30 MED ORDER — NORETHINDRONE 0.35 MG PO TABS: 1.0000 | ORAL_TABLET | Freq: Every day | ORAL | 11 refills | Status: DC

## 2018-09-30 MED FILL — CEPHALEXIN 500 MG CAPSULE: 500 | 7 days supply | Qty: 28 | Fill #0

## 2018-09-30 MED FILL — ENALAPRIL MALEATE 20 MG TAB: 20 | 30 days supply | Qty: 30 | Fill #0

## 2018-09-30 MED FILL — metFORMIN HCL 500 MG TABS: 500 | 30 days supply | Qty: 60 | Fill #0

## 2018-09-30 MED FILL — NORETHINDRONE 0.35 MG TABS: 0.35 | 28 days supply | Qty: 28 | Fill #0

## 2018-09-30 NOTE — Progress Notes (Signed)
Subjective:  *[place] interpreter at bedside for this visit*    [name] is a 48 y.o. female who presents for a postpartum visit. She is 5 weeks postpartum following a SVD. I have fully reviewed the prenatal and intrapartum course. The delivery was at [redacted]w[redacted]d gestational weeks. Outcome: spontaneous vaginal delivery. Anesthesia: none. Postpartum course has been Uremarkable. Baby's course has bee unremarkable. Baby is feeding by bottle - Enfamil Nutramigen. Bleeding no bleeding. Bowel function is normal. Bladder function is normal. Patient is not sexually active. Contraception method is: wants POPs. Postpartum depression screening: Negative  The following portions of the patient's history were reviewed and updated as appropriate: allergies, current medications, past medical history, past social history, past surgical history and problem list.  Review of Systems Pertinent items are noted in HPI.   Objective:    LMP  (LMP Unknown)   General:  alert, cooperative and appears stated age   Breasts:  Superficial abscess noted on left lateral breast, ~2 cm diameter with yellow drainage. No other masses palpated  Lungs: clear to auscultation bilaterally  Heart:  regular rate and rhythm, S1, S2 normal, no murmur, click, rub or gallop  Abdomen: soft, non-tender; bowel sounds normal; no masses,  no organomegaly   Vulva:  not evaluated  Vagina: not evaluated        Assessment:  Patient has CHTN & preexisting diabetes. Has not been on her meds in the last 12+ days b/c she ran out of her pills & didn't realize there were refills available.  Pap smear is up to date; she is instructed to return for Pap smear in 1 year She is behind on her mammogram (last done 2014) and now has a breast abscess. Currently without insurance. Msg to [facility] for imaging.   Plan:   1. Chronic hypertension in pregnancy -hasn't taken meds in almost 2 weeks. Refilled meds to [facility]. Pt will call them for follow  up  2. Type 2 diabetes mellitus affecting pregnancy, antepartum -hasn't taken her metformin 2 weeks. Will refill meds & f/u with [place]  3. Encounter for initial prescription of contraceptive pills  - norethindrone (MICRONOR) 0.35 MG tablet; Take 1 tablet (0.35 mg total) by mouth daily.  Dispense: 1 Package; Refill: 11  4. Cutaneous abscess of other site  - cephALEXin (KEFLEX) 500 MG capsule; Take 1 capsule (500 mg total) by mouth 4 (four) times daily.  Dispense: 28 capsule; Refill: 0   Jorje Guild, NP

## 2018-09-30 NOTE — Patient Instructions (Addendum)
Health Maintenance, Female Adopting a healthy lifestyle and getting preventive care are important in promoting health and wellness. Ask your health care provider about:  The right schedule for you to have regular tests and exams.  Things you can do on your own to prevent diseases and keep yourself healthy. What should I know about diet, weight, and exercise? Eat a healthy diet   Eat a diet that includes plenty of vegetables, fruits, low-fat dairy products, and lean protein.  Do not eat a lot of foods that are high in solid fats, added sugars, or sodium. Maintain a healthy weight Body mass index (BMI) is used to identify weight problems. It estimates body fat based on height and weight. Your health care provider can help determine your BMI and help you achieve or maintain a healthy weight. Get regular exercise Get regular exercise. This is one of the most important things you can do for your health. Most adults should:  Exercise for at least 150 minutes each week. The exercise should increase your heart rate and make you sweat (moderate-intensity exercise).  Do strengthening exercises at least twice a week. This is in addition to the moderate-intensity exercise.  Spend less time sitting. Even light physical activity can be beneficial. Watch cholesterol and blood lipids Have your blood tested for lipids and cholesterol at 46 years of age, then have this test every 5 years. Have your cholesterol levels checked more often if:  Your lipid or cholesterol levels are high.  You are older than 46 years of age.  You are at high risk for heart disease. What should I know about cancer screening? Depending on your health history and family history, you may need to have cancer screening at various ages. This may include screening for:  Breast cancer.  Cervical cancer.  Colorectal cancer.  Skin cancer.  Lung cancer. What should I know about heart disease, diabetes, and high  blood pressure? Blood pressure and heart disease  High blood pressure causes heart disease and increases the risk of stroke. This is more likely to develop in people who have high blood pressure readings, are of African descent, or are overweight.  Have your blood pressure checked: ? Every 3-5 years if you are 3218-46 years of age. ? Every year if you are 46 years old or older. Diabetes Have regular diabetes screenings. This checks your fasting blood sugar level. Have the screening done:  Once every three years after age 46 if you are at a normal weight and have a low risk for diabetes.  More often and at a younger age if you are overweight or have a high risk for diabetes. What should I know about preventing infection? Hepatitis B If you have a higher risk for hepatitis B, you should be screened for this virus. Talk with your health care provider to find out if you are at risk for hepatitis B infection. Hepatitis C Testing is recommended for:  Everyone born from [id number] through 1963.  Anyone with known risk factors for hepatitis C. Sexually transmitted infections (STIs)  Get screened for STIs, including gonorrhea and chlamydia, if: ? You are sexually active and are younger than 46 years of age. ? You are older than 46 years of age and your health care provider tells you that you are at risk for this type of infection. ? Your sexual activity has changed since you were last screened, and you are at increased risk for chlamydia or gonorrhea.  Ask your health care provider if you are at risk.  Ask your health care provider about whether you are at high risk for HIV. Your health care provider may recommend a prescription medicine to help prevent HIV infection. If you choose to take medicine to prevent HIV, you should first get tested for HIV. You should then be tested every 3 months for as long as you are taking the medicine. Pregnancy  If you are about to stop having your period  (premenopausal) and you may become pregnant, seek counseling before you get pregnant.  Take 400 to 800 micrograms (mcg) of folic acid every day if you become pregnant.  Ask for birth control (contraception) if you want to prevent pregnancy. Osteoporosis and menopause Osteoporosis is a disease in which the bones lose minerals and strength with aging. This can result in bone fractures. If you are 71 years old or older, or if you are at risk for osteoporosis and fractures, ask your health care provider if you should:  Be screened for bone loss.  Take a calcium or vitamin D supplement to lower your risk of fractures.  Be given hormone replacement therapy (HRT) to treat symptoms of menopause. Follow these instructions at home: Lifestyle  Do not use any products that contain nicotine or tobacco, such as cigarettes, e-cigarettes, and chewing tobacco. If you need help quitting, ask your health care provider.  Do not use street drugs.  Do not share needles.  Ask your health care provider for help if you need support or information about quitting drugs. Alcohol use  Do not drink alcohol if: ? Your health care provider tells you not to drink. ? You are pregnant, may be pregnant, or are planning to become pregnant.  If you drink alcohol: ? Limit how much you use to 0-1 drink a day. ? Limit intake if you are breastfeeding.  Be aware of how much alcohol is in your drink. In the [place] one drink equals one 12 oz bottle of beer (355 mL), one 5 oz glass of wine (148 mL), or one 1 oz glass of hard liquor (44 mL). General instructions  Schedule regular health, dental, and eye exams.  Stay current with your vaccines.  Tell your health care provider if: ? You often feel depressed. ? You have ever been abused or do not feel safe at home. Summary  Adopting a healthy lifestyle and getting preventive care are important in promoting health and wellness.  Follow your health care provider's  instructions about healthy diet, exercising, and getting tested or screened for diseases.  Follow your health care provider's instructions on monitoring your cholesterol and blood pressure. This information is not intended to replace advice given to you by your health care provider. Make sure you discuss any questions you have with your health care provider. Document Released: 07/10/2010 Document Revised: 12/18/2017 Document Reviewed: 12/18/2017 Elsevier Patient Education  2020 [employer]. Diabetes Mellitus and Nutrition, Adult When you have diabetes (diabetes mellitus), it is very important to have healthy eating habits because your blood sugar (glucose) levels are greatly affected by what you eat and drink. Eating healthy foods in the appropriate amounts, at about the same times every day, can help you:  Control your blood glucose.  Lower your risk of heart disease.  Improve your blood pressure.  Reach or maintain a healthy weight. Every person with diabetes is different, and each person has different needs for a meal plan. Your health care provider may recommend that you  work with a diet and nutrition specialist (dietitian) to make a meal plan that is best for you. Your meal plan may vary depending on factors such as:  The calories you need.  The medicines you take.  Your weight.  Your blood glucose, blood pressure, and cholesterol levels.  Your activity level.  Other health conditions you have, such as heart or kidney disease. How do carbohydrates affect me? Carbohydrates, also called carbs, affect your blood glucose level more than any other type of food. Eating carbs naturally raises the amount of glucose in your blood. Carb counting is a method for keeping track of how many carbs you eat. Counting carbs is important to keep your blood glucose at a healthy level, especially if you use insulin or take certain oral diabetes medicines. It is important to know how many carbs you  can safely have in each meal. This is different for every person. Your dietitian can help you calculate how many carbs you should have at each meal and for each snack. Foods that contain carbs include:  Bread, cereal, rice, pasta, and crackers.  Potatoes and corn.  Peas, beans, and lentils.  Milk and yogurt.  Fruit and juice.  Desserts, such as cakes, cookies, ice cream, and candy. How does alcohol affect me? Alcohol can cause a sudden decrease in blood glucose (hypoglycemia), especially if you use insulin or take certain oral diabetes medicines. Hypoglycemia can be a life-threatening condition. Symptoms of hypoglycemia (sleepiness, dizziness, and confusion) are similar to symptoms of having too much alcohol. If your health care provider says that alcohol is safe for you, follow these guidelines:  Limit alcohol intake to no more than 1 drink per day for nonpregnant women and 2 drinks per day for men. One drink equals 12 oz of beer, 5 oz of wine, or 1 oz of hard liquor.  Do not drink on an empty stomach.  Keep yourself hydrated with water, diet soda, or unsweetened iced tea.  Keep in mind that regular soda, juice, and other mixers may contain a lot of sugar and must be counted as carbs. What are tips for following this plan?  Reading food labels  Start by checking the serving size on the "Nutrition Facts" label of packaged foods and drinks. The amount of calories, carbs, fats, and other nutrients listed on the label is based on one serving of the item. Many items contain more than one serving per package.  Check the total grams (g) of carbs in one serving. You can calculate the number of servings of carbs in one serving by dividing the total carbs by 15. For example, if a food has 30 g of total carbs, it would be equal to 2 servings of carbs.  Check the number of grams (g) of saturated and trans fats in one serving. Choose foods that have low or no amount of these fats.  Check the  number of milligrams (mg) of salt (sodium) in one serving. Most people should limit total sodium intake to less than 2,300 mg per day.  Always check the nutrition information of foods labeled as "low-fat" or "nonfat". These foods may be higher in added sugar or refined carbs and should be avoided.  Talk to your dietitian to identify your daily goals for nutrients listed on the label. Shopping  Avoid buying canned, premade, or processed foods. These foods tend to be high in fat, sodium, and added sugar.  Shop around the outside edge of the grocery store. This includes  fresh fruits and vegetables, bulk grains, fresh meats, and fresh dairy. Cooking  Use low-heat cooking methods, such as baking, instead of high-heat cooking methods like deep frying.  Cook using healthy oils, such as olive, canola, or sunflower oil.  Avoid cooking with butter, cream, or high-fat meats. Meal planning  Eat meals and snacks regularly, preferably at the same times every day. Avoid going long periods of time without eating.  Eat foods high in fiber, such as fresh fruits, vegetables, beans, and whole grains. Talk to your dietitian about how many servings of carbs you can eat at each meal.  Eat 4-6 ounces (oz) of lean protein each day, such as lean meat, chicken, fish, eggs, or tofu. One oz of lean protein is equal to: ? 1 oz of meat, chicken, or fish. ? 1 egg. ?  cup of tofu.  Eat some foods each day that contain healthy fats, such as avocado, nuts, seeds, and fish. Lifestyle  Check your blood glucose regularly.  Exercise regularly as told by your health care provider. This may include: ? 150 minutes of moderate-intensity or vigorous-intensity exercise each week. This could be brisk walking, biking, or water aerobics. ? Stretching and doing strength exercises, such as yoga or weightlifting, at least 2 times a week.  Take medicines as told by your health care provider.  Do not use any products that  contain nicotine or tobacco, such as cigarettes and e-cigarettes. If you need help quitting, ask your health care provider.  Work with a Veterinary surgeon or diabetes educator to identify strategies to manage stress and any emotional and social challenges. Questions to ask a health care provider  Do I need to meet with a diabetes educator?  Do I need to meet with a dietitian?  What number can I call if I have questions?  When are the best times to check my blood glucose? Where to find more information:  American Diabetes Association: diabetes.org  Academy of Nutrition and Dietetics: www.eatright.[employer] of Diabetes and Digestive and Kidney Diseases (NIH): CarFli[employer].tn Summary  A healthy meal plan will help you control your blood glucose and maintain a healthy lifestyle.  Working with a diet and nutrition specialist (dietitian) can help you make a meal plan that is best for you.  Keep in mind that carbohydrates (carbs) and alcohol have immediate effects on your blood glucose levels. It is important to count carbs and to use alcohol carefully. This information is not intended to replace advice given to you by your health care provider. Make sure you discuss any questions you have with your health care provider. Document Released: 09/21/2004 Document Revised: 12/07/2016 Document Reviewed: 01/30/2016 Elsevier Patient Education  2020 [employer]. Hypertension, Adult Hypertension is another name for high blood pressure. High blood pressure forces your heart to work harder to pump blood. This can cause problems over time. There are two numbers in a blood pressure reading. There is a top number (systolic) over a bottom number (diastolic). It is best to have a blood pressure that is below 120/80. Healthy choices can help lower your blood pressure, or you may need medicine to help lower it. What are the causes? The cause of this condition is not known. Some conditions may be  related to high blood pressure. What increases the risk?  Smoking.  Having type 2 diabetes mellitus, high cholesterol, or both.  Not getting enough exercise or physical activity.  Being overweight.  Having too much fat, sugar, calories, or salt (sodium)  in your diet.  Drinking too much alcohol.  Having long-term (chronic) kidney disease.  Having a family history of high blood pressure.  Age. Risk increases with age.  Race. You may be at higher risk if you are African American.  Gender. Men are at higher risk than women before age 16. After age 41, women are at higher risk than men.  Having obstructive sleep apnea.  Stress. What are the signs or symptoms?  High blood pressure may not cause symptoms. Very high blood pressure (hypertensive crisis) may cause: ? Headache. ? Feelings of worry or nervousness (anxiety). ? Shortness of breath. ? Nosebleed. ? A feeling of being sick to your stomach (nausea). ? Throwing up (vomiting). ? Changes in how you see. ? Very bad chest pain. ? Seizures. How is this treated?  This condition is treated by making healthy lifestyle changes, such as: ? Eating healthy foods. ? Exercising more. ? Drinking less alcohol.  Your health care provider may prescribe medicine if lifestyle changes are not enough to get your blood pressure under control, and if: ? Your top number is above 130. ? Your bottom number is above 80.  Your personal target blood pressure may vary. Follow these instructions at home: Eating and drinking   If told, follow the DASH eating plan. To follow this plan: ? Fill one half of your plate at each meal with fruits and vegetables. ? Fill one fourth of your plate at each meal with whole grains. Whole grains include whole-wheat pasta, brown rice, and whole-grain bread. ? Eat or drink low-fat dairy products, such as skim milk or low-fat yogurt. ? Fill one fourth of your plate at each meal with low-fat (lean) proteins.  Low-fat proteins include fish, chicken without skin, eggs, beans, and tofu. ? Avoid fatty meat, cured and processed meat, or chicken with skin. ? Avoid pre-made or processed food.  Eat less than 1,500 mg of salt each day.  Do not drink alcohol if: ? Your doctor tells you not to drink. ? You are pregnant, may be pregnant, or are planning to become pregnant.  If you drink alcohol: ? Limit how much you use to:  0-1 drink a day for women.  0-2 drinks a day for men. ? Be aware of how much alcohol is in your drink. In the [place] one drink equals one 12 oz bottle of beer (355 mL), one 5 oz glass of wine (148 mL), or one 1 oz glass of hard liquor (44 mL). Lifestyle   Work with your doctor to stay at a healthy weight or to lose weight. Ask your doctor what the best weight is for you.  Get at least 30 minutes of exercise most days of the week. This may include walking, swimming, or biking.  Get at least 30 minutes of exercise that strengthens your muscles (resistance exercise) at least 3 days a week. This may include lifting weights or doing Pilates.  Do not use any products that contain nicotine or tobacco, such as cigarettes, e-cigarettes, and chewing tobacco. If you need help quitting, ask your doctor.  Check your blood pressure at home as told by your doctor.  Keep all follow-up visits as told by your doctor. This is important. Medicines  Take over-the-counter and prescription medicines only as told by your doctor. Follow directions carefully.  Do not skip doses of blood pressure medicine. The medicine does not work as well if you skip doses. Skipping doses also puts you at risk for problems.  Ask your doctor about side effects or reactions to medicines that you should watch for. Contact a doctor if you:  Think you are having a reaction to the medicine you are taking.  Have headaches that keep coming back (recurring).  Feel dizzy.  Have swelling in your ankles.  Have trouble  with your vision. Get help right away if you:  Get a very bad headache.  Start to feel mixed up (confused).  Feel weak or numb.  Feel faint.  Have very bad pain in your: ? Chest. ? Belly (abdomen).  Throw up more than once.  Have trouble breathing. Summary  Hypertension is another name for high blood pressure.  High blood pressure forces your heart to work harder to pump blood.  For most people, a normal blood pressure is less than 120/80.  Making healthy choices can help lower blood pressure. If your blood pressure does not get lower with healthy choices, you may need to take medicine. This information is not intended to replace advice given to you by your health care provider. Make sure you discuss any questions you have with your health care provider. Document Released: 06/13/2007 Document Revised: 09/04/2017 Document Reviewed: 09/04/2017 Elsevier Patient Education  2020 [employer].

## 2018-10-16 ENCOUNTER — Telehealth (HOSPITAL_COMMUNITY): Payer: Self-pay

## 2018-10-16 NOTE — Telephone Encounter (Signed)
Telephoned interpreter services on 10/03/2018 and 10/16/2018. Both days unable to connect with [facility] interpreter to schedule [facility] appointment

## 2018-10-24 ENCOUNTER — Ambulatory Visit: Payer: Self-pay | Admitting: Nurse Practitioner

## 2018-10-24 MED FILL — metFORMIN HCL 500 MG TABS: 500 | 30 days supply | Qty: 60 | Fill #1

## 2018-10-24 MED FILL — ENALAPRIL MALEATE 20 MG TAB: 20 | 30 days supply | Qty: 30 | Fill #1

## 2018-12-08 MED FILL — ENALAPRIL MALEATE 20 MG TAB: 20 | 30 days supply | Qty: 30 | Fill #2

## 2018-12-08 MED FILL — metFORMIN HCL 500 MG TABS: 500 | 30 days supply | Qty: 60 | Fill #2

## 2018-12-15 ENCOUNTER — Encounter: Payer: Self-pay | Admitting: Nurse Practitioner

## 2018-12-15 ENCOUNTER — Ambulatory Visit: Payer: Self-pay | Attending: Nurse Practitioner | Admitting: Nurse Practitioner

## 2018-12-15 ENCOUNTER — Other Ambulatory Visit: Payer: Self-pay

## 2018-12-15 VITALS — BP 155/84 | HR 79 | Temp 98.4°F | Ht 66.0 in | Wt 210.2 lb

## 2018-12-15 DIAGNOSIS — Z30011 Encounter for initial prescription of contraceptive pills: Secondary | ICD-10-CM

## 2018-12-15 DIAGNOSIS — E782 Mixed hyperlipidemia: Secondary | ICD-10-CM

## 2018-12-15 DIAGNOSIS — Z130 Encounter for screening for diseases of the blood and blood-forming organs and certain disorders involving the immune mechanism: Secondary | ICD-10-CM

## 2018-12-15 DIAGNOSIS — I10 Essential (primary) hypertension: Secondary | ICD-10-CM

## 2018-12-15 DIAGNOSIS — E1165 Type 2 diabetes mellitus with hyperglycemia: Secondary | ICD-10-CM

## 2018-12-15 LAB — POCT GLYCOSYLATED HEMOGLOBIN (HGB A1C): Hemoglobin A1C: 8.2 % — AB (ref 4.0–5.6)

## 2018-12-15 LAB — GLUCOSE, POCT (MANUAL RESULT ENTRY): POC Glucose: 184 mg/dl — AB (ref 70–99)

## 2018-12-15 MED ORDER — METFORMIN HCL 500 MG PO TABS: 1000.0000 mg | ORAL_TABLET | Freq: Two times a day (BID) | ORAL | 1 refills | Status: DC

## 2018-12-15 MED ORDER — NORETHINDRONE 0.35 MG PO TABS: 1.0000 | ORAL_TABLET | Freq: Every day | ORAL | 3 refills | Status: DC

## 2018-12-15 MED ORDER — TRUEPLUS LANCETS 28G MISC: 3 refills | Status: DC

## 2018-12-15 MED ORDER — ENALAPRIL MALEATE 20 MG PO TABS: 40.0000 mg | ORAL_TABLET | Freq: Every day | ORAL | 2 refills | Status: DC

## 2018-12-15 MED ORDER — TRUE METRIX BLOOD GLUCOSE TEST VI STRP: ORAL_STRIP | 12 refills | Status: DC

## 2018-12-15 MED FILL — TRUEplus LANCETS 28G MISC: 50 days supply | Qty: 100 | Fill #0

## 2018-12-15 MED FILL — NORETHINDRONE 0.35 MG TABS: 0.35 | 84 days supply | Qty: 84 | Fill #0

## 2018-12-15 MED FILL — TRUE METRIX TEST STRIP: 50 days supply | Qty: 100 | Fill #0

## 2018-12-15 NOTE — Progress Notes (Signed)
Assessment & Plan:  [name] was seen today for follow-up.  Diagnoses and all orders for this visit:  Uncontrolled type 2 diabetes mellitus with hyperglycemia (HCC) -     Glucose (CBG) -     HgB A1c -     metFORMIN (GLUCOPHAGE) 500 MG tablet; Take 2 tablets (1,000 mg total) by mouth 2 (two) times daily with a meal. Please fill as a 90 day supply Continue blood sugar control as discussed in office today, low carbohydrate diet, and regular physical exercise as tolerated, 150 minutes per week (30 min each day, 5 days per week, or 50 min 3 days per week). Keep blood sugar logs with fasting goal of 90-130 mg/dl, post prandial (after you eat) less than 180.  For Hypoglycemia: BS <60 and Hyperglycemia BS >400; contact the clinic ASAP. Annual eye exams and foot exams are recommended.  Essential hypertension -     CMP14+EGFR -     enalapril (VASOTEC) 20 MG tablet; Take 2 tablets (40 mg total) by mouth daily. Continue all antihypertensives as prescribed.  Remember to bring in your blood pressure log with you for your follow up appointment.  DASH/Mediterranean Diets are healthier choices for HTN.   Mixed hyperlipidemia -     Lipid Panel LDL not at goal Unable to start statin at this time as she is breastfeeding.  INSTRUCTIONS: Work on a low fat, heart healthy diet and participate in regular aerobic exercise program by working out at least 150 minutes per week; 5 days a week-30 minutes per day. Avoid red meat/beef/steak,  fried foods. junk foods, sodas, sugary drinks, unhealthy snacking, alcohol and smoking.  Drink at least 80 oz of water per day and monitor your carbohydrate intake daily.    Encounter for initial prescription of contraceptive pills -     norethindrone (MICRONOR) 0.35 MG tablet; Take 1 tablet (0.35 mg total) by mouth daily. Please fill as a 90 day supply  Screening for deficiency anemia -     CBC    Patient has been counseled on age-appropriate routine health concerns for  screening and prevention. These are reviewed and up-to-date. Referrals have been placed accordingly. Immunizations are up-to-date or declined.    Subjective:   Chief Complaint  Patient presents with   Follow-up    Pt. is here to follow up on diabetes and hypertension.    HPI [name] 48 y.o. female presents to office today for follow up. She had a baby boy in August and is not returning for primary care visit. Currently breastfeeding.   Onsite [place] interpreter accompanying [name] today.   Has not scheduled her mammogram. Will refer to breast clinic.   Essential Hypertension Blood pressure is elevated. Will increase enalapril to 40 mg and have her return for BP check in 2-3 weeks. She does endorse medication compliance. Does not monitor her blood pressure at home nor does she follow a DASH diet. Denies chest pain, shortness of breath, palpitations, lightheadedness, dizziness, headaches or BLE edema.  BP Readings from Last 3 Encounters:  12/15/18 (!) 155/84  09/30/18 (!) 151/94  08/27/18 (!) 148/90   DM TYPE 2 Will increase metformin from 500 mg BID to 1000 mg BID.  A1c not at goal although improved from 9.1 to 8.2. She denies any hypo or hyperglycemic symptoms. LDL not at goal of <70. Statin is contraindicated since she is breastfeeding. We did discuss low fat low chol diet.  Lab Results  Component Value Date   HGBA1C 8.2 (  A) 12/15/2018   Lab Results  Component Value Date   LDLCALC 101 (H) 06/24/2017     Review of Systems  Constitutional: Negative for fever, malaise/fatigue and weight loss.  HENT: Negative.  Negative for nosebleeds.   Eyes: Negative.  Negative for blurred vision, double vision and photophobia.  Respiratory: Negative.  Negative for cough and shortness of breath.   Cardiovascular: Negative.  Negative for chest pain, palpitations and leg swelling.  Gastrointestinal: Negative.  Negative for heartburn, nausea and vomiting.  Musculoskeletal: Negative.  Negative  for myalgias.  Neurological: Negative.  Negative for dizziness, focal weakness, seizures and headaches.  Psychiatric/Behavioral: Negative.  Negative for suicidal ideas.    Past Medical History:  Diagnosis Date   Hypertension    Type II diabetes mellitus with complication [facility])     Past Surgical History:  Procedure Laterality Date   DILATION AND CURETTAGE OF UTERUS     HYSTEROSCOPY WITH RESECTOSCOPE  2011    Family History  Problem Relation Age of Onset   Alcohol abuse Neg Hx     Social History Reviewed with no changes to be made today.   Outpatient Medications Prior to Visit  Medication Sig Dispense Refill   cephALEXin (KEFLEX) 500 MG capsule Take 1 capsule (500 mg total) by mouth 4 (four) times daily. 28 capsule 0   enalapril (VASOTEC) 20 MG tablet Take 1 tablet (20 mg total) by mouth daily. 30 tablet 2   metFORMIN (GLUCOPHAGE) 500 MG tablet Take 1 tablet (500 mg total) by mouth 2 (two) times daily with a meal. 60 tablet 2   norethindrone (MICRONOR) 0.35 MG tablet Take 1 tablet (0.35 mg total) by mouth daily. 1 Package 11   ibuprofen (ADVIL) 600 MG tablet Take 1 tablet (600 mg total) by mouth every 6 (six) hours as needed for mild pain, moderate pain or cramping. (Patient not taking: Reported on 09/30/2018) 30 tablet 2   Prenatal Vit-Fe Fumarate-FA (MULTIVITAMIN-PRENATAL) 27-0.8 MG TABS tablet Take 1 tablet by mouth daily at 12 noon.     senna-docusate (SENOKOT-S) 8.6-50 MG tablet Take 2 tablets by mouth at bedtime as needed for mild constipation or moderate constipation. (Patient not taking: Reported on 09/30/2018) 30 tablet 2   No facility-administered medications prior to visit.     No Known Allergies     Objective:    BP (!) 155/84 (BP Location: Right Arm, Patient Position: Sitting, Cuff Size: Large)    Pulse 79    Temp 98.4 F (36.9 C) (Oral)    Ht 5' 6"  (1.676 m)    Wt 210 lb 3.2 oz (95.3 kg)    SpO2 96%    Breastfeeding Yes    BMI 33.93 kg/m  Wt Readings  from Last 3 Encounters:  12/15/18 210 lb 3.2 oz (95.3 kg)  09/30/18 179 lb (81.2 kg)  08/18/18 220 lb (99.8 kg)    Physical Exam Vitals signs and nursing note reviewed.  Constitutional:      Appearance: She is well-developed.  HENT:     Head: Normocephalic and atraumatic.  Neck:     Musculoskeletal: Normal range of motion.  Cardiovascular:     Rate and Rhythm: Normal rate and regular rhythm.     Heart sounds: Normal heart sounds. No murmur. No friction rub. No gallop.   Pulmonary:     Effort: Pulmonary effort is normal. No tachypnea or respiratory distress.     Breath sounds: Normal breath sounds. No decreased breath sounds, wheezing, rhonchi or rales.  Chest:     Chest wall: No tenderness.  Abdominal:     General: Bowel sounds are normal.     Palpations: Abdomen is soft.  Musculoskeletal: Normal range of motion.  Skin:    General: Skin is warm and dry.  Neurological:     Mental Status: She is alert and oriented to person, place, and time.     Coordination: Coordination normal.  Psychiatric:        Behavior: Behavior normal. Behavior is cooperative.        Thought Content: Thought content normal.        Judgment: Judgment normal.          Patient has been counseled extensively about nutrition and exercise as well as the importance of adherence with medications and regular follow-up. The patient was given clear instructions to go to ER or return to medical center if symptoms don't improve, worsen or new problems develop. The patient verbalized understanding.   Follow-up: Return in about 2 weeks (around 12/29/2018) for BP recheck with LU[facility].   Gildardo Pounds, FNP-BC [facility], [place]   12/15/2018, 1:34 PM

## 2018-12-16 LAB — CMP14+EGFR
ALT: 12 IU/L (ref 0–32)
AST: 10 IU/L (ref 0–40)
Albumin/Globulin Ratio: 1.3 (ref 1.2–2.2)
Albumin: 3.9 g/dL (ref 3.8–4.8)
Alkaline Phosphatase: 82 IU/L (ref 39–117)
BUN/Creatinine Ratio: 27 — ABNORMAL HIGH (ref 9–23)
BUN: 18 mg/dL (ref 6–24)
Bilirubin Total: <0.2 mg/dL (ref 0.0–1.2)
CO2: 20 mmol/L (ref 20–29)
Calcium: 9.8 mg/dL (ref 8.7–10.2)
Chloride: 104 mmol/L (ref 96–106)
Creatinine, Ser: 0.66 mg/dL (ref 0.57–1.00)
GFR calc Af Amer: 123 mL/min/{1.73_m2} (ref >59)
GFR calc non Af Amer: 106 mL/min/{1.73_m2} (ref >59)
Globulin, Total: 2.9 g/dL (ref 1.5–4.5)
Glucose: 189 mg/dL — ABNORMAL HIGH (ref 65–99)
Potassium: 4.8 mmol/L (ref 3.5–5.2)
Sodium: 137 mmol/L (ref 134–144)
Total Protein: 6.8 g/dL (ref 6.0–8.5)

## 2018-12-16 LAB — LIPID PANEL
Chol/HDL Ratio: 4.8 ratio — ABNORMAL HIGH (ref 0.0–4.4)
Cholesterol, Total: 202 mg/dL — ABNORMAL HIGH (ref 100–199)
HDL: 42 mg/dL (ref >39)
LDL Chol Calc (NIH): 123 mg/dL — ABNORMAL HIGH (ref 0–99)
Triglycerides: 209 mg/dL — ABNORMAL HIGH (ref 0–149)
VLDL Cholesterol Cal: 37 mg/dL (ref 5–40)

## 2018-12-16 LAB — CBC
Hematocrit: 36.2 % (ref 34.0–46.6)
Hemoglobin: 11.1 g/dL (ref 11.1–15.9)
MCH: 24 pg — ABNORMAL LOW (ref 26.6–33.0)
MCHC: 30.7 g/dL — ABNORMAL LOW (ref 31.5–35.7)
MCV: 78 fL — ABNORMAL LOW (ref 79–97)
Platelets: 402 10*3/uL (ref 150–450)
RBC: 4.62 x10E6/uL (ref 3.77–5.28)
RDW: 13.3 % (ref 11.7–15.4)
WBC: 7.4 10*3/uL (ref 3.4–10.8)

## 2018-12-20 ENCOUNTER — Other Ambulatory Visit: Payer: Self-pay | Admitting: Nurse Practitioner

## 2018-12-20 MED ORDER — ATORVASTATIN CALCIUM 40 MG PO TABS: 40.0000 mg | ORAL_TABLET | Freq: Every day | ORAL | 3 refills | Status: DC

## 2018-12-22 MED FILL — ATORVASTATIN CALCIUM 40 MG: 40 | 30 days supply | Qty: 30 | Fill #0

## 2018-12-24 ENCOUNTER — Telehealth: Payer: Self-pay | Admitting: Nurse Practitioner

## 2018-12-24 NOTE — Telephone Encounter (Signed)
Patient returned nurse call. Please f/u with patient.

## 2018-12-25 NOTE — Telephone Encounter (Signed)
Spoke to patient and inform on results and medication. Pt. Understood.  [place] interpreter assist with the call.

## 2018-12-29 ENCOUNTER — Other Ambulatory Visit: Payer: Self-pay

## 2018-12-29 ENCOUNTER — Ambulatory Visit: Payer: Self-pay | Attending: Nurse Practitioner | Admitting: Pharmacist

## 2018-12-29 VITALS — BP 160/88 | HR 80

## 2018-12-29 DIAGNOSIS — I10 Essential (primary) hypertension: Secondary | ICD-10-CM

## 2018-12-29 MED FILL — ENALAPRIL MALEATE 20 MG TAB: 20 | 30 days supply | Qty: 60 | Fill #0

## 2018-12-29 NOTE — Progress Notes (Signed)
   S:   PCP: Zelda    Patient arrives in good spirits. Presents to the clinic for BP check.   Patient was referred and last seen by PCP on 12/15/18. BP at that visit was 155/84 - enalapril dose increased to 40 mg daily.   Patient denies adherence with medications. Never picked up higher dose of enalapril.   Current BP Medications include:  Enalapril 40 mg daily   Dietary habits include: limits salt, denies drinking caffeine  Exercise habits include: denies  Family / Social history:  - FHx: no pertinent positives  - Tobacco: never smoker - Alcohol: denies use   O:  Vitals:   12/29/18 0934  BP: (!) 160/88  Pulse: 80   Home BP readings: none   Last 3 Office BP readings: BP Readings from Last 3 Encounters:  12/15/18 (!) 155/84  09/30/18 (!) 151/94  08/27/18 (!) 148/90    BMET    Component Value Date/Time   NA 137 12/15/2018 1133   K 4.8 12/15/2018 1133   CL 104 12/15/2018 1133   CO2 20 12/15/2018 1133   GLUCOSE 189 (H) 12/15/2018 1133   GLUCOSE 252 (H) 08/17/2018 0758   BUN 18 12/15/2018 1133   CREATININE 0.66 12/15/2018 1133   CALCIUM 9.8 12/15/2018 1133   GFRNONAA 106 12/15/2018 1133   GFRAA 123 12/15/2018 1133    Renal function: Estimated Creatinine Clearance: 102.2 mL/min (by C-G formula based on SCr of 0.66 mg/dL).  Clinical ASCVD: No  The 10-year ASCVD risk score Mikey Bussing DC Jr., et al., 2013) is: 21.6%   Values used to calculate the score:     Age: 35 years     Sex: Female     Is Non-Hispanic African American: Yes     Diabetic: Yes     Tobacco smoker: No     Systolic Blood Pressure: 563 mmHg     Is BP treated: Yes     HDL Cholesterol: 42 mg/dL     Total Cholesterol: 202 mg/dL  A/P: Hypertension longstanding currently uncontrolled on current medications. BP Goal = <130/80 mmHg. Patient is not adherent to her medication; she agrees to pick-up from our pharmacy today after this visit. I have instructed to make a follow-up appt in 2-3 weeks after taking  the new dose of enalapril daily. Additionally, she has been instructed to take her medication prior to seeing me that day.  -Continued enalapril 40 mg daily.  -Counseled on lifestyle modifications for blood pressure control including reduced dietary sodium, increased exercise, adequate sleep  Results reviewed and written information provided.   Total time in face-to-face counseling 30 minutes.   F/U Clinic Visit in 2-3 weeks for recheck.   Benard Halsted, PharmD, [place] [phone]

## 2018-12-30 ENCOUNTER — Encounter: Payer: Self-pay | Admitting: Pharmacist

## 2019-01-21 ENCOUNTER — Telehealth (HOSPITAL_COMMUNITY): Payer: Self-pay

## 2019-01-21 NOTE — Telephone Encounter (Signed)
Telephoned husband's phone number. Left a message to call [facility] to schedule appointment for his spouse.

## 2019-02-03 MED FILL — ENALAPRIL MALEATE 20 MG TAB: 20 | 30 days supply | Qty: 60 | Fill #1

## 2019-02-03 MED FILL — ATORVASTATIN CALCIUM 40 MG: 40 | 30 days supply | Qty: 30 | Fill #1

## 2019-02-03 MED FILL — metFORMIN HCL 500 MG TABS: 500 | 30 days supply | Qty: 120 | Fill #0

## 2019-03-16 ENCOUNTER — Telehealth: Payer: Self-pay

## 2019-03-16 NOTE — Telephone Encounter (Signed)
Telephoned patient at husband's phone number([phone]). Left [facility] information with person answering the phone.

## 2019-03-30 ENCOUNTER — Ambulatory Visit: Payer: Self-pay | Admitting: Nurse Practitioner

## 2019-04-18 ENCOUNTER — Inpatient Hospital Stay (HOSPITAL_COMMUNITY)
Admission: AD | Admit: 2019-04-18 | Discharge: 2019-04-18 | Disposition: A | Discharge status: Home / Self Care | Payer: Self-pay | Attending: Obstetrics and Gynecology | Admitting: Obstetrics and Gynecology

## 2019-04-18 ENCOUNTER — Encounter (HOSPITAL_COMMUNITY): Payer: Self-pay | Admitting: Obstetrics and Gynecology

## 2019-04-18 ENCOUNTER — Inpatient Hospital Stay (HOSPITAL_COMMUNITY): Payer: Self-pay

## 2019-04-18 ENCOUNTER — Other Ambulatory Visit: Payer: Self-pay

## 2019-04-18 DIAGNOSIS — O209 Hemorrhage in early pregnancy, unspecified: Secondary | ICD-10-CM

## 2019-04-18 DIAGNOSIS — Z3A00 Weeks of gestation of pregnancy not specified: Secondary | ICD-10-CM

## 2019-04-18 DIAGNOSIS — O3680X0 Pregnancy with inconclusive fetal viability, not applicable or unspecified: Secondary | ICD-10-CM

## 2019-04-18 DIAGNOSIS — O039 Complete or unspecified spontaneous abortion without complication: Secondary | ICD-10-CM

## 2019-04-18 DIAGNOSIS — O200 Threatened abortion: Secondary | ICD-10-CM | POA: Insufficient documentation

## 2019-04-18 DIAGNOSIS — Z3A13 13 weeks gestation of pregnancy: Secondary | ICD-10-CM | POA: Insufficient documentation

## 2019-04-18 LAB — WET PREP, GENITAL
Clue Cells Wet Prep HPF POC: NONE SEEN
Sperm: NONE SEEN
Trich, Wet Prep: NONE SEEN
Yeast Wet Prep HPF POC: NONE SEEN

## 2019-04-18 LAB — HCG, QUANTITATIVE, PREGNANCY: hCG, Beta Chain, Quant, S: 4055 m[IU]/mL — ABNORMAL HIGH (ref <5)

## 2019-04-18 LAB — URINALYSIS, ROUTINE W REFLEX MICROSCOPIC
Bacteria, UA: NONE SEEN
Bilirubin Urine: NEGATIVE
Glucose, UA: 50 mg/dL — AB
Ketones, ur: NEGATIVE mg/dL
Nitrite: NEGATIVE
Protein, ur: 100 mg/dL — AB
RBC / HPF: >50 RBC/hpf — ABNORMAL HIGH (ref 0–5)
Specific Gravity, Urine: 1.019 (ref 1.005–1.030)
pH: 7 (ref 5.0–8.0)

## 2019-04-18 LAB — CBC
HCT: 34.8 % — ABNORMAL LOW (ref 36.0–46.0)
Hemoglobin: 11.1 g/dL — ABNORMAL LOW (ref 12.0–15.0)
MCH: 24 pg — ABNORMAL LOW (ref 26.0–34.0)
MCHC: 31.9 g/dL (ref 30.0–36.0)
MCV: 75.3 fL — ABNORMAL LOW (ref 80.0–100.0)
Platelets: 326 10*3/uL (ref 150–400)
RBC: 4.62 MIL/uL (ref 3.87–5.11)
RDW: 16.4 % — ABNORMAL HIGH (ref 11.5–15.5)
WBC: 8.2 10*3/uL (ref 4.0–10.5)
nRBC: 0 % (ref 0.0–0.2)

## 2019-04-18 LAB — POCT PREGNANCY, URINE: Preg Test, Ur: POSITIVE — AB

## 2019-04-18 MED ORDER — IBUPROFEN 800 MG PO TABS: 800.0000 mg | ORAL_TABLET | Freq: Three times a day (TID) | ORAL | 0 refills | Status: DC

## 2019-04-18 MED ORDER — OXYCODONE-ACETAMINOPHEN 5-325 MG PO TABS
2.0000 | ORAL_TABLET | Freq: Once | ORAL | Status: AC
  Administered 2019-04-18: 14:00:00 2 via ORAL
  Filled 2019-04-18: qty 2

## 2019-04-18 MED ORDER — KETOROLAC TROMETHAMINE 60 MG/2ML IM SOLN
60.0000 mg | Freq: Once | INTRAMUSCULAR | Status: AC
  Administered 2019-04-18: 14:00:00 60 mg via INTRAMUSCULAR
  Filled 2019-04-18: qty 2

## 2019-04-18 NOTE — Discharge Instructions (Signed)

## 2019-04-18 NOTE — MAU Provider Note (Signed)
History     CSN: [id number]  Arrival date and time: 04/18/19 1034   First Provider Initiated Contact with Patient 04/18/19 1117      Chief Complaint  Patient presents with  . Abdominal Pain  . Vaginal Bleeding   HPI [name] is a 49 y.o. [id number] at unknown gestation who presents with vaginal bleeding and abdominal pain. She states it all started this morning. She states she filled the toilet bowl with blood and large clots 3 times before coming in. She reports an LMP on approximately January 5th but has irregular periods. She rates the pain in her abdomen a 3/10 and has not tried anything for the pain. She states since arrival to MAU, the bleeding has slowed.   OB History    Gravida  4   Para  3   Term  3   Preterm      AB      Living  2     SAB      TAB      Ectopic      Multiple  0   Live Births  3           Past Medical History:  Diagnosis Date  . Hypertension   . Type II diabetes mellitus with complication [facility])     Past Surgical History:  Procedure Laterality Date  . DILATION AND CURETTAGE OF UTERUS    . HYSTEROSCOPY WITH RESECTOSCOPE  2011    Family History  Problem Relation Age of Onset  . Alcohol abuse Neg Hx     Social History   Tobacco Use  . Smoking status: Never Smoker  . Smokeless tobacco: Never Used  Substance Use Topics  . Alcohol use: No  . Drug use: No    Allergies: No Known Allergies  No medications prior to admission.    Review of Systems  Constitutional: Negative.  Negative for fatigue and fever.  HENT: Negative.   Respiratory: Negative.  Negative for shortness of breath.   Cardiovascular: Negative.  Negative for chest pain.  Gastrointestinal: Positive for abdominal pain. Negative for constipation, diarrhea, nausea and vomiting.  Genitourinary: Positive for vaginal bleeding. Negative for dysuria and vaginal discharge.  Neurological: Negative.  Negative for dizziness and headaches.   Physical Exam   Blood  pressure 118/68, pulse 94, temperature 99 F (37.2 C), temperature source Oral, resp. rate 18, weight 94 kg, last menstrual period 01/13/2019, SpO2 100 %, currently breastfeeding.  Physical Exam  Nursing note and vitals reviewed. Constitutional: She is oriented to person, place, and time. She appears well-developed and well-nourished. No distress.  HENT:  Head: Normocephalic.  Eyes: Pupils are equal, round, and reactive to light.  Cardiovascular: Normal rate, regular rhythm and normal heart sounds.  Respiratory: Effort normal and breath sounds normal. No respiratory distress.  GI: Soft. Bowel sounds are normal. She exhibits no distension. There is no abdominal tenderness.  Genitourinary:    Genitourinary Comments: SSE: Products of conception protruding from cervical os, removed with ring forceps. No bleeding after removal of POC.   Neurological: She is alert and oriented to person, place, and time.  Skin: Skin is warm and dry.  Psychiatric: She has a normal mood and affect. Her behavior is normal. Judgment and thought content normal.    MAU Course  Procedures Results for orders placed or performed during the hospital encounter of 04/18/19 (from the past 24 hour(s))  Pregnancy, urine POC     Status: Abnormal   Collection  Time: 04/18/19 11:07 AM  Result Value Ref Range   Preg Test, Ur POSITIVE (A) NEGATIVE  Wet prep, genital     Status: Abnormal   Collection Time: 04/18/19 11:21 AM  Result Value Ref Range   Yeast Wet Prep HPF POC NONE SEEN NONE SEEN   Trich, Wet Prep NONE SEEN NONE SEEN   Clue Cells Wet Prep HPF POC NONE SEEN NONE SEEN   WBC, Wet Prep HPF POC FEW (A) NONE SEEN   Sperm NONE SEEN   Urinalysis, Routine w reflex microscopic     Status: Abnormal   Collection Time: 04/18/19 11:23 AM  Result Value Ref Range   Color, Urine YELLOW YELLOW   APPearance HAZY (A) CLEAR   Specific Gravity, Urine 1.019 1.005 - 1.030   pH 7.0 5.0 - 8.0   Glucose, UA 50 (A) NEGATIVE mg/dL    Hgb urine dipstick LARGE (A) NEGATIVE   Bilirubin Urine NEGATIVE NEGATIVE   Ketones, ur NEGATIVE NEGATIVE mg/dL   Protein, ur 100 (A) NEGATIVE mg/dL   Nitrite NEGATIVE NEGATIVE   Leukocytes,Ua TRACE (A) NEGATIVE   RBC / HPF >50 (H) 0 - 5 RBC/hpf   WBC, UA 11-20 0 - 5 WBC/hpf   Bacteria, UA NONE SEEN NONE SEEN   Squamous Epithelial / LPF 0-5 0 - 5   Mucus PRESENT    Hyaline Casts, UA PRESENT   CBC     Status: Abnormal   Collection Time: 04/18/19 11:54 AM  Result Value Ref Range   WBC 8.2 4.0 - 10.5 K/uL   RBC 4.62 3.87 - 5.11 MIL/uL   Hemoglobin 11.1 (L) 12.0 - 15.0 g/dL   HCT 34.8 (L) 36.0 - 46.0 %   MCV 75.3 (L) 80.0 - 100.0 fL   MCH 24.0 (L) 26.0 - 34.0 pg   MCHC 31.9 30.0 - 36.0 g/dL   RDW 16.4 (H) 11.5 - 15.5 %   Platelets 326 150 - 400 K/uL   nRBC 0.0 0.0 - 0.2 %  hCG, quantitative, pregnancy     Status: Abnormal   Collection Time: 04/18/19 11:54 AM  Result Value Ref Range   hCG, Beta Chain, Quant, S 4,055 (H) <5 mIU/mL   US OB Transvaginal  Result Date: 04/18/2019 CLINICAL DATA:  Vaginal bleeding. Quantitative beta HCG pending. Estimated gestational age per LMP 13 weeks 4 days, but patient not sure. EXAM: TRANSVAGINAL OB ULTRASOUND TECHNIQUE: Transvaginal ultrasound was performed for complete evaluation of the gestation as well as the maternal uterus, adnexal regions, and pelvic cul-de-sac. COMPARISON:  None. FINDINGS: Intrauterine gestational sac: Not definitively visualized. There is a thickened heterogeneous endometrium measuring 2 cm in thickness containing a small oval anechoic structure which may or may not represent an intrauterine gestational sac. It's mean diameter is 4.5 mm which would be compatible with an estimated gestational age of [redacted] weeks 1 day if this truly was an intrauterine sac. Yolk sac:  Not visualized. Embryo:  Not visualized. Cardiac Activity: Not visualized. Heart Rate: Not visualized.  Bpm Subchorionic hemorrhage:  None visualized. Maternal  uterus/adnexae: Ovaries are normal size, shape and position. No free pelvic fluid. IMPRESSION: Thickened heterogeneous endometrium measuring 2 cm containing a small oval anechoic structure. This small anechoic structure is likely part of heterogeneous endometrial hemorrhagic debris and not a true gestational sac in the setting of failed pregnancy. Early IUP is possible, but unlikely as recommend correlation with patient's quantitative beta HCG and follow-up serial quantitative beta HCG as well as pelvic ultrasound 7-10 days.  Electronically Signed   By: Elberta Fortis M.D.   On: 04/18/2019 13:40    MDM UA, UPT CBC, HCG ABO/Rh- O Pos Wet prep and gc/chlamydia US OB Comp Less 14 weeks with Transvaginal  Patient requesting pain medication after passing more tissue. Will give PO pain medication.  Discussed results of ultrasound at length with patient. Reviewed that this is likely a miscarriage but must be treated as pregnancy of unknown location at this time. Patient agreeable to [facility] on [facility] for repeat HCG.   Assessment and Plan   1. Pregnancy of unknown anatomic location   2. Threatened miscarriage   3. Vaginal bleeding affecting early pregnancy    -Discharge home in stable condition -Strict ectopic precautions discussed. Reviewed bleeding and pain warning signs -Patient advised to follow-up with [facility] on Tuesday at 0900 for stat HCG. -Patient may return to MAU as needed or if her condition were to change or worsen  Rolm Bookbinder CNM 04/18/2019,

## 2019-04-18 NOTE — MAU Note (Signed)
[name] is a 49 y.o. at [redacted]w[redacted]d here in MAU reporting: vaginal bleeding started today. Pt reports it is very heavy. Also having abdominal pain.  LMP: 01/13/19 approximately  Onset of complaint: today  Pain score: 8/10  Vitals:   04/18/19 1103  BP: (!) 148/92  Pulse: 99  Resp: 16  Temp: 99 F (37.2 C)  SpO2: 100%     Lab orders placed from triage: UA, UPT

## 2019-04-20 LAB — GC/CHLAMYDIA PROBE AMP (~~LOC~~) NOT AT ARMC
Chlamydia: NEGATIVE
Comment: NEGATIVE
Comment: NORMAL
Neisseria Gonorrhea: NEGATIVE

## 2019-04-21 ENCOUNTER — Other Ambulatory Visit: Payer: Self-pay

## 2019-04-21 ENCOUNTER — Ambulatory Visit (INDEPENDENT_AMBULATORY_CARE_PROVIDER_SITE_OTHER): Payer: Self-pay | Admitting: *Deleted

## 2019-04-21 ENCOUNTER — Encounter: Payer: Self-pay | Admitting: *Deleted

## 2019-04-21 VITALS — BP 143/85 | HR 87 | Temp 98.3°F

## 2019-04-21 DIAGNOSIS — O200 Threatened abortion: Secondary | ICD-10-CM

## 2019-04-21 LAB — BETA HCG QUANT (REF LAB): hCG Quant: 256 m[IU]/mL

## 2019-04-21 LAB — SURGICAL PATHOLOGY

## 2019-04-21 MED ORDER — NORETHINDRONE 0.35 MG PO TABS: 1.0000 | ORAL_TABLET | Freq: Every day | ORAL | 3 refills | Status: DC

## 2019-04-21 MED FILL — NORETHINDRONE 0.35 MG TABS: 0.35 | 84 days supply | Qty: 84 | Fill #0

## 2019-04-21 NOTE — Patient Instructions (Signed)
Contraception Choices Contraception, also called birth control, refers to methods or devices that prevent pregnancy. Hormonal methods Contraceptive implant  A contraceptive implant is a thin, plastic tube that contains a hormone. It is inserted into the upper part of the arm. It can remain in place for up to 3 years. Progestin-only injections Progestin-only injections are injections of progestin, a synthetic form of the hormone progesterone. They are given every 3 months by a health care provider. Birth control pills  Birth control pills are pills that contain hormones that prevent pregnancy. They must be taken once a day, preferably at the same time each day. Birth control patch  The birth control patch contains hormones that prevent pregnancy. It is placed on the skin and must be changed once a week for three weeks and removed on the fourth week. A prescription is needed to use this method of contraception. Vaginal ring  A vaginal ring contains hormones that prevent pregnancy. It is placed in the vagina for three weeks and removed on the fourth week. After that, the process is repeated with a new ring. A prescription is needed to use this method of contraception. Emergency contraceptive Emergency contraceptives prevent pregnancy after unprotected sex. They come in pill form and can be taken up to 5 days after sex. They work best the sooner they are taken after having sex. Most emergency contraceptives are available without a prescription. This method should not be used as your only form of birth control. Barrier methods Female condom  A female condom is a thin sheath that is worn over the penis during sex. Condoms keep sperm from going inside a woman's body. They can be used with a spermicide to increase their effectiveness. They should be disposed after a single use. Female condom  A female condom is a soft, loose-fitting sheath that is put into the vagina before sex. The condom keeps sperm  from going inside a woman's body. They should be disposed after a single use. Diaphragm  A diaphragm is a soft, dome-shaped barrier. It is inserted into the vagina before sex, along with a spermicide. The diaphragm blocks sperm from entering the uterus, and the spermicide kills sperm. A diaphragm should be left in the vagina for 6-8 hours after sex and removed within 24 hours. A diaphragm is prescribed and fitted by a health care provider. A diaphragm should be replaced every 1-2 years, after giving birth, after gaining more than 15 lb (6.8 kg), and after pelvic surgery. Cervical cap  A cervical cap is a round, soft latex or plastic cup that fits over the cervix. It is inserted into the vagina before sex, along with spermicide. It blocks sperm from entering the uterus. The cap should be left in place for 6-8 hours after sex and removed within 48 hours. A cervical cap must be prescribed and fitted by a health care provider. It should be replaced every 2 years. Sponge  A sponge is a soft, circular piece of polyurethane foam with spermicide on it. The sponge helps block sperm from entering the uterus, and the spermicide kills sperm. To use it, you make it wet and then insert it into the vagina. It should be inserted before sex, left in for at least 6 hours after sex, and removed and thrown away within 30 hours. Spermicides Spermicides are chemicals that kill or block sperm from entering the cervix and uterus. They can come as a cream, jelly, suppository, foam, or tablet. A spermicide should be inserted into the   vagina with an applicator at least 10-15 minutes before sex to allow time for it to work. The process must be repeated every time you have sex. Spermicides do not require a prescription. Intrauterine contraception Intrauterine device (IUD) An IUD is a T-shaped device that is put in a woman's uterus. There are two types:  Hormone IUD.This type contains progestin, a synthetic form of the hormone  progesterone. This type can stay in place for 3-5 years.  Copper IUD.This type is wrapped in copper wire. It can stay in place for 10 years.  Permanent methods of contraception Female tubal ligation In this method, a woman's fallopian tubes are sealed, tied, or blocked during surgery to prevent eggs from traveling to the uterus. Hysteroscopic sterilization In this method, a small, flexible insert is placed into each fallopian tube. The inserts cause scar tissue to form in the fallopian tubes and block them, so sperm cannot reach an egg. The procedure takes about 3 months to be effective. Another form of birth control must be used during those 3 months. Female sterilization This is a procedure to tie off the tubes that carry sperm (vasectomy). After the procedure, the man can still ejaculate fluid (semen). Natural planning methods Natural family planning In this method, a couple does not have sex on days when the woman could become pregnant. Calendar method This means keeping track of the length of each menstrual cycle, identifying the days when pregnancy can happen, and not having sex on those days. Ovulation method In this method, a couple avoids sex during ovulation. Symptothermal method This method involves not having sex during ovulation. The woman typically checks for ovulation by watching changes in her temperature and in the consistency of cervical mucus. Post-ovulation method In this method, a couple waits to have sex until after ovulation. Summary  Contraception, also called birth control, means methods or devices that prevent pregnancy.  Hormonal methods of contraception include implants, injections, pills, patches, vaginal rings, and emergency contraceptives.  Barrier methods of contraception can include female condoms, female condoms, diaphragms, cervical caps, sponges, and spermicides.  There are two types of IUDs (intrauterine devices). An IUD can be put in a woman's uterus to  prevent pregnancy for 3-5 years.  Permanent sterilization can be done through a procedure for males, females, or both.  Natural family planning methods involve not having sex on days when the woman could become pregnant. This information is not intended to replace advice given to you by your health care provider. Make sure you discuss any questions you have with your health care provider. Document Revised: 12/27/2016 Document Reviewed: 01/28/2016 Elsevier Patient Education  2020 [employer]  

## 2019-04-21 NOTE — Progress Notes (Signed)
Patient seen and assessed by nursing staff during this encounter. I have reviewed the chart and agree with the documentation and plan.  Jaynie Collins, MD 04/21/2019 1:14 PM

## 2019-04-21 NOTE — Progress Notes (Signed)
Pt seen @ [facility] on 4/10 for threatened miscarriage. Today she denies pain. She is having light pink spotting. Stat BHCG drawn and pt will be called later today w/results.   1205  Called pt after consult w/Dr. Macon Large regarding BHCG results. Pt was advised that her results are consistent with miscarriage. She does not require further blood tests. I asked pt if she desires to be pregnant and she said no but she had missed some doses of birth control and this pregnancy was a "mistake". She would like to continue taking Micronor and needs a new Rx which was e-prescribed as requested. Pt was advised that she must take the pill every Luverne Zerkle and at the same time in order to prevent pregnancy. Also there are alternate methods of birth control available and she would need to schedule office appt if she desires Nexplanon or IUD. Pt has not been taking Enalapril and her BP was elevated today. Per chart review, she was advised in December 2020 to increase dose to 40 mg daily and return to office 2-3 weeks later for BP check. I advised pt to begin taking Enalapril as prescribed and to call office to schedule appt in 2 weeks for BP check. Pt voiced understanding of all information and instructions given.

## 2019-05-12 ENCOUNTER — Encounter: Payer: Self-pay | Admitting: *Deleted

## 2019-07-10 IMAGING — US US MFM OB DETAIL+14 WK
1 series · 13 of 28 positions shown · non-contrast
Comparison: none

[Series 1: us mfm ob detail+14 wk · 65 acquisitions, 13 frames shown]
[im 3/65]
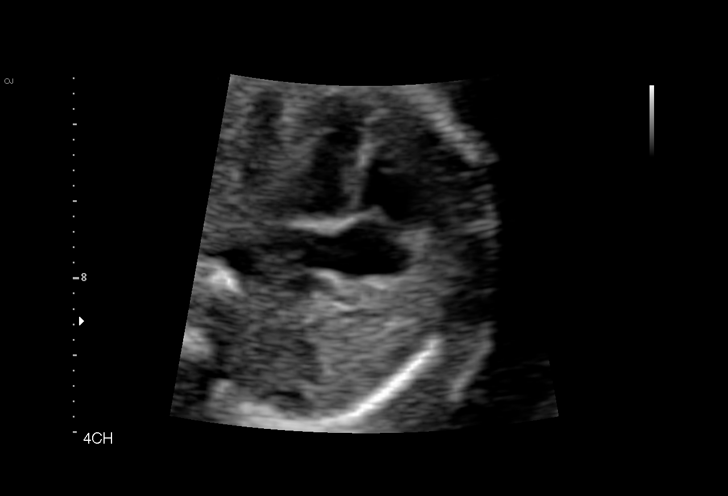
[im 8/65]
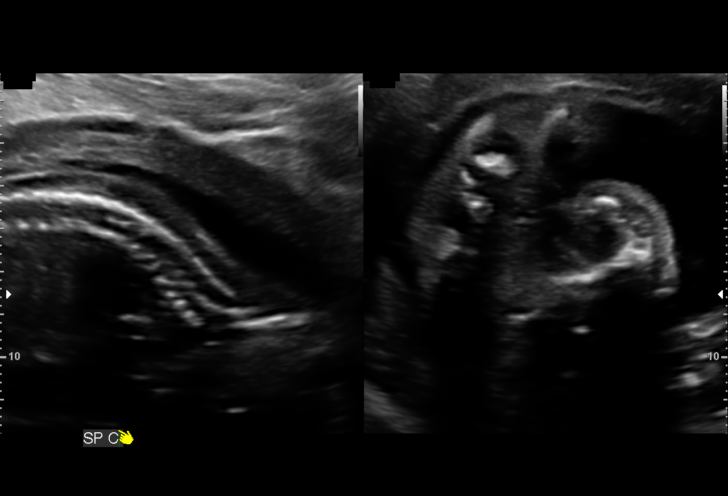
[im 12/65]
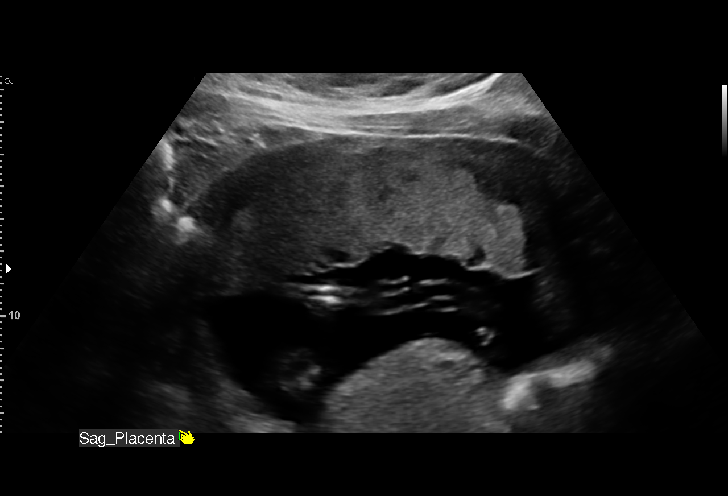
[im 17/65]
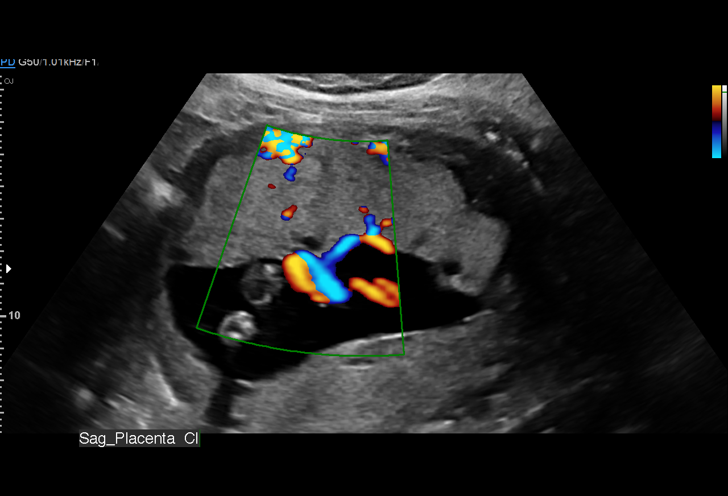
[im 22/65]
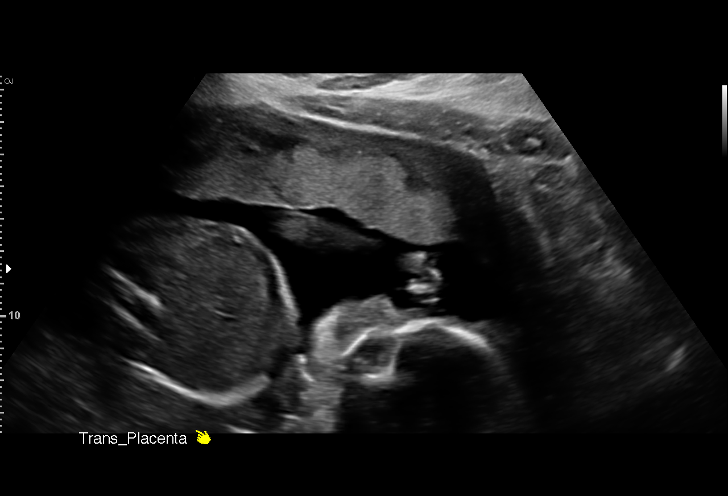
[im 27/65]
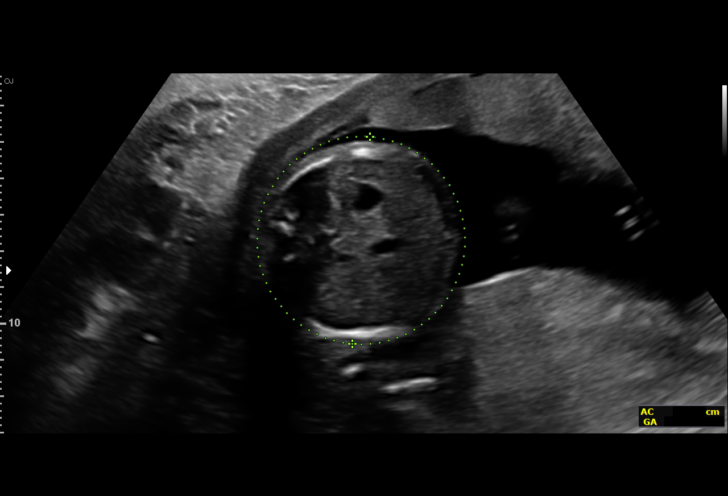
[im 34/65]
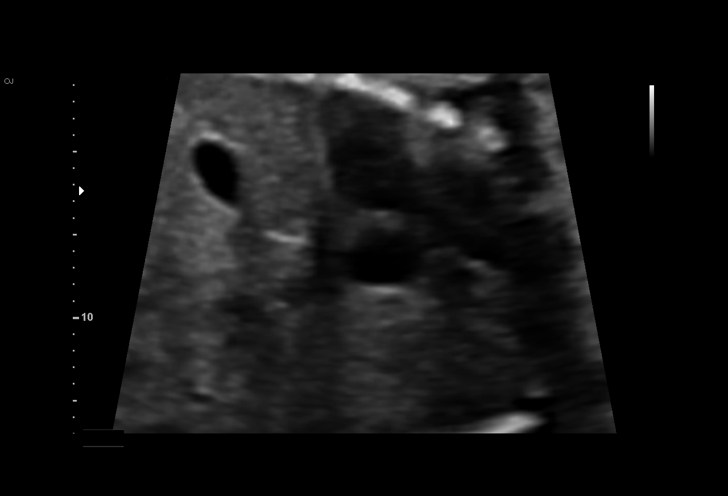
[im 38/65]
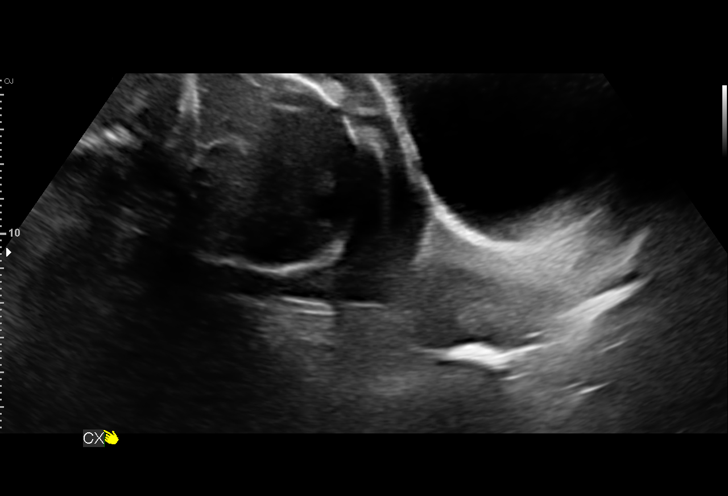
[im 43/65]
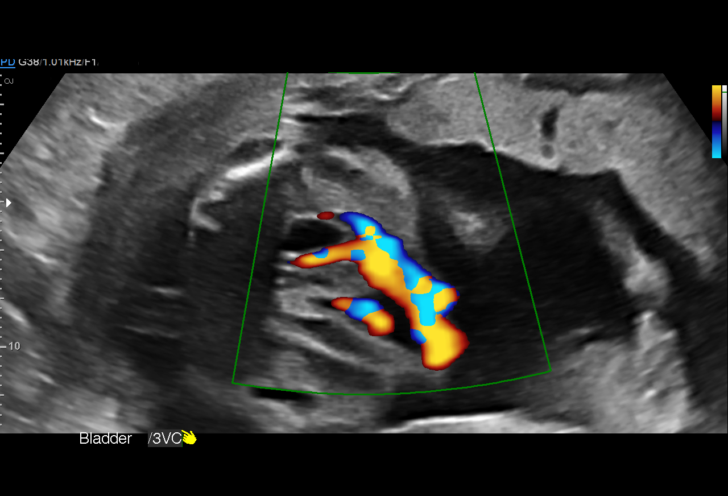
[im 48/65]
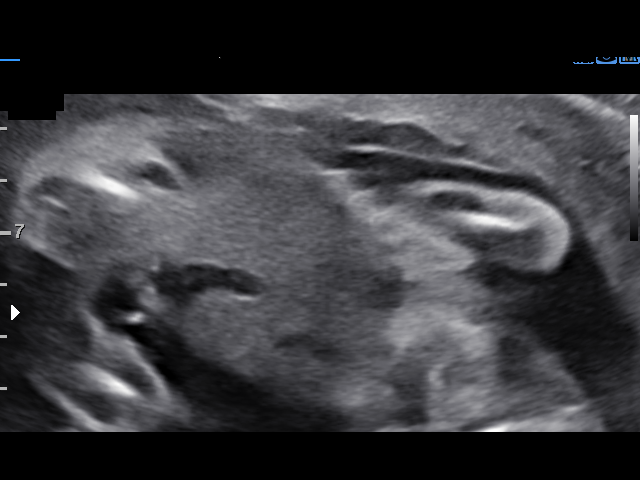
[im 53/65]
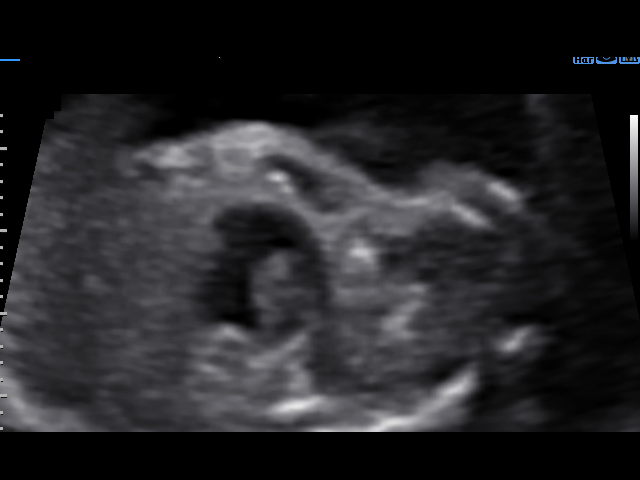
[im 57/65]
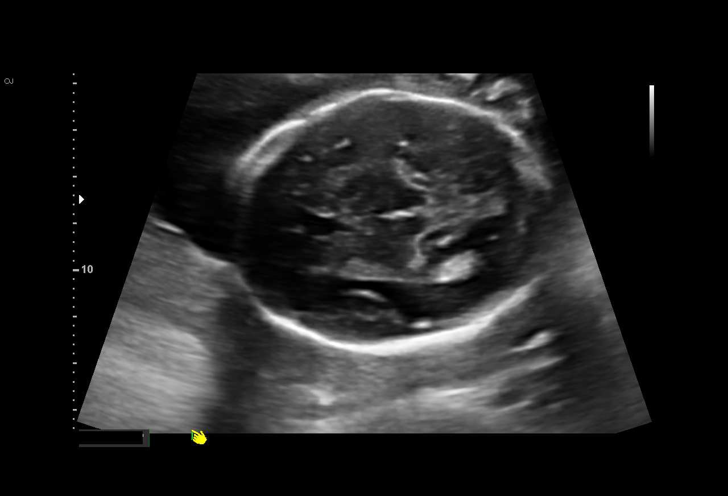
[im 62/65]
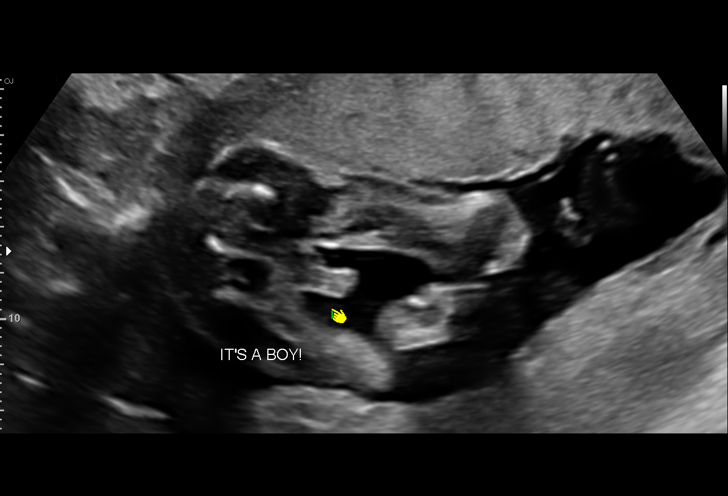

[13 of 28 positions shown; findings below may reference images not displayed]

[facility]yn Clinic

1  ZO MADE              [id number]      [id number]     [id number]
Indications

22 weeks gestation of pregnancy
Encounter for antenatal screening for
malformations
Advanced maternal age multigravida (46),
second trimester-low risk quad
Advanced Paternal Age - (56)
Obesity complicating pregnancy, second
trimester
Gestational diabetes in pregnancy,
controlled by oral hypoglycemic drugs -
glyburide
OB History

Blood Type:            Height:  5'5"   Weight (lb):  203      BMI:
Gravidity:    2         Term:   1        Prem:   0        SAB:   0
TOP:          0       Ectopic:  0        Living: 1
Fetal Evaluation

Num Of Fetuses:     1
Fetal Heart         155
Rate(bpm):
Cardiac Activity:   Observed
Presentation:       Cephalic
Placenta:           Anterior, above cervical os
P. Cord Insertion:  Visualized
Amniotic Fluid
AFI FV:      Subjectively within normal limits

Largest Pocket(cm)
5.18
Biometry

BPD:      52.8  mm     G. Age:  22w 0d         49  %    CI:        72.14   %   70 - 86
FL/HC:      17.5   %   18.4 -
HC:      197.8  mm     G. Age:  22w 0d         37  %    HC/AC:      1.09       1.06 -
AC:      181.2  mm     G. Age:  22w 6d         73  %    FL/BPD:     65.7   %   71 - 87
FL:       34.7  mm     G. Age:  20w 6d         12  %    FL/AC:      19.2   %   20 - 24
HUM:      36.3  mm     G. Age:  22w 5d         65  %

Est. FW:     472  gm      1 lb 1 oz     47  %
Gestational Age

LMP:           19w 3d       Date:   04/10/16                 EDD:   01/15/17
U/S Today:     22w 0d                                        EDD:   12/28/16
Best:          22w 0d    Det. By:   U/S (08/24/16)           EDD:   12/28/16
Anatomy

Cranium:               Appears normal         Aortic Arch:            Appears normal
Cavum:                 Appears normal         Ductal Arch:            Appears normal
Ventricles:            Appears normal         Diaphragm:              Appears normal
Choroid Plexus:        Appears normal         Stomach:                Appears normal, left
sided
Cerebellum:            Appears normal         Abdomen:                Appears normal
Posterior Fossa:       Appears normal         Abdominal Wall:         Not well visualized
Nuchal Fold:           Appears normal         Cord Vessels:           Appears normal (3
vessel cord)
Face:                  Appears normal         Kidneys:                Appear normal
(orbits and profile)
Lips:                  Not well visualized    Bladder:                Appears normal
Thoracic:              Appears normal         Spine:                  Appears normal
Heart:                 Appears normal         Upper Extremities:      Appears normal
(4CH, axis, and situs
RVOT:                  Appears normal         Lower Extremities:      Appears normal
LVOT:                  Appears normal

Other:  Male gender. Heels visualized. Technically difficult due to maternal
habitus and fetal position.
Cervix Uterus Adnexa

Cervix
Length:           3.58  cm.
Normal appearance by transabdominal scan.

Uterus
No abnormality visualized.

Left Ovary
Not visualized. No adnexal mass visualized.

Right Ovary
Not visualized. No adnexal mass visualized.
Cul De Sac:   No free fluid seen.
Adnexa:       No abnormality visualized.
Impression

Singleton intrauterine pregnancy at 22+0 weeks by today's
scan; AMA, GDM
Review of the anatomy shows no sonographic markers for
aneuploidy or structural anomalies
However, facial evaluation should be considered suboptimal
secondary to fetal position and maternal body habitus
Amniotic fluid volume is normal
Estimated fetal weight is 472g which is growth in the 47th
percentile
Recommendations

Recommend changing EDC to 12/28/2016
To see genetics counseling this AM and quad screen will be
recalculated using new EDC
Repeat scan in 4 weeks to complete anatomy and assess
fetal growth

## 2019-07-27 IMAGING — US US MFM OB LIMITED
1 series · 15 of 28 positions shown · non-contrast
Comparison: none

[Series 1: us mfm ob limited · 32 acquisitions, 15 frames shown]
[im 1/32]
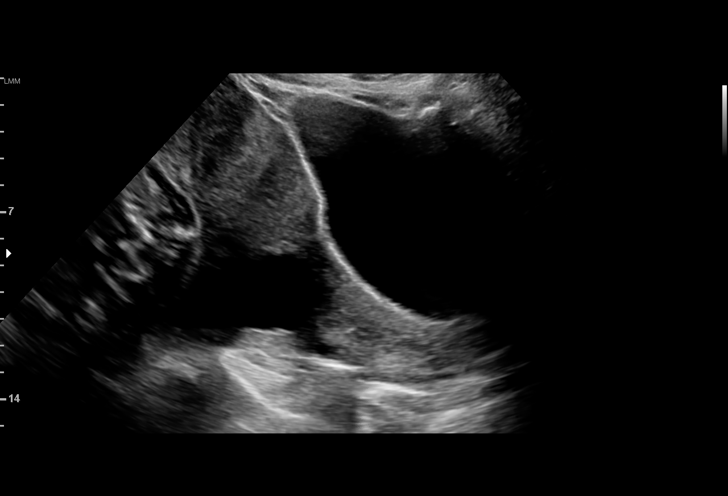
[im 3/32]
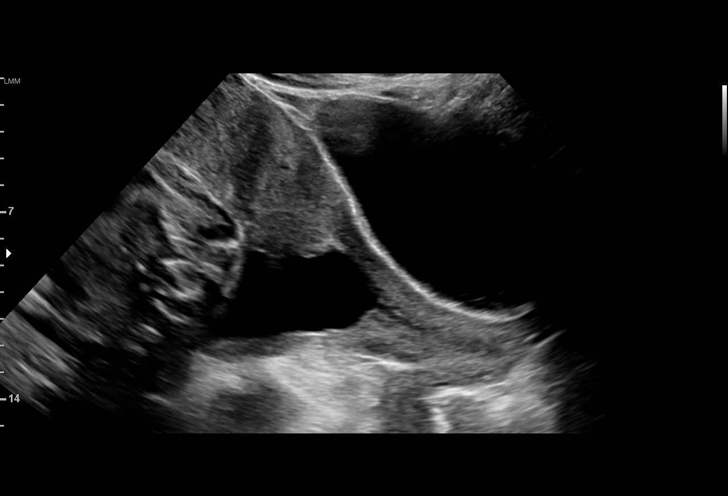
[im 5/32]
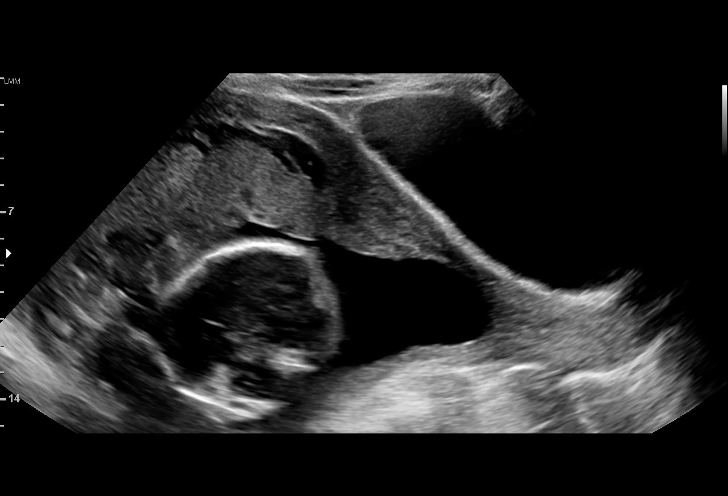
[im 7/32]
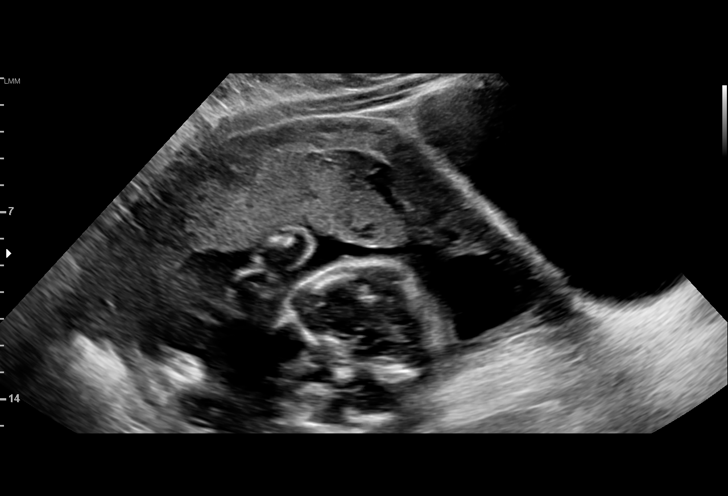
[im 10/32]
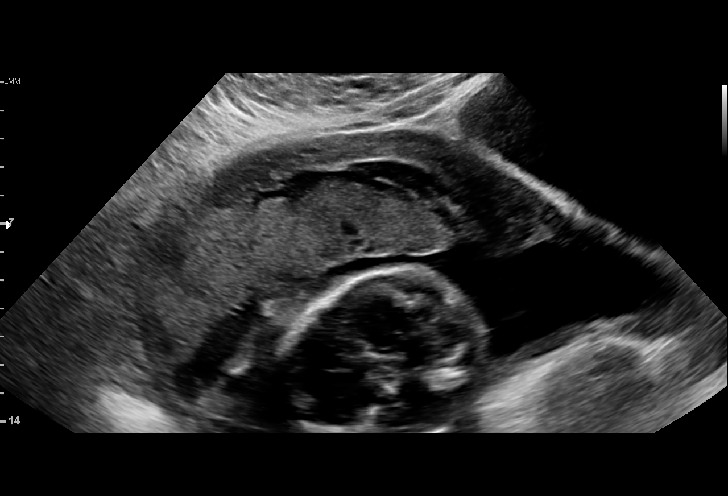
[im 12/32]
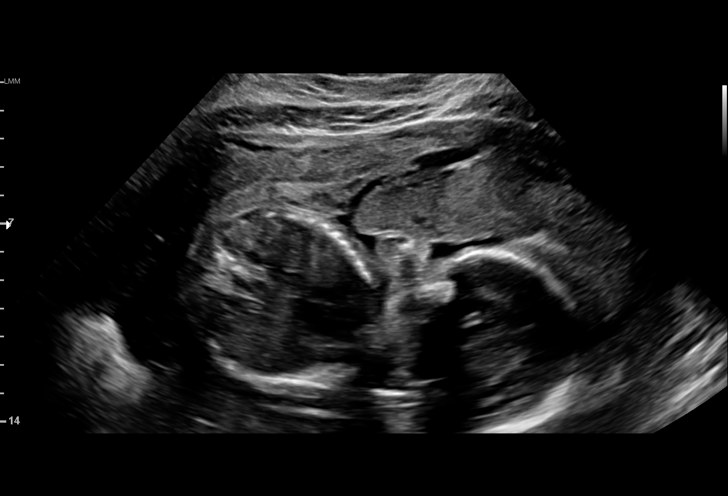
[im 14/32]
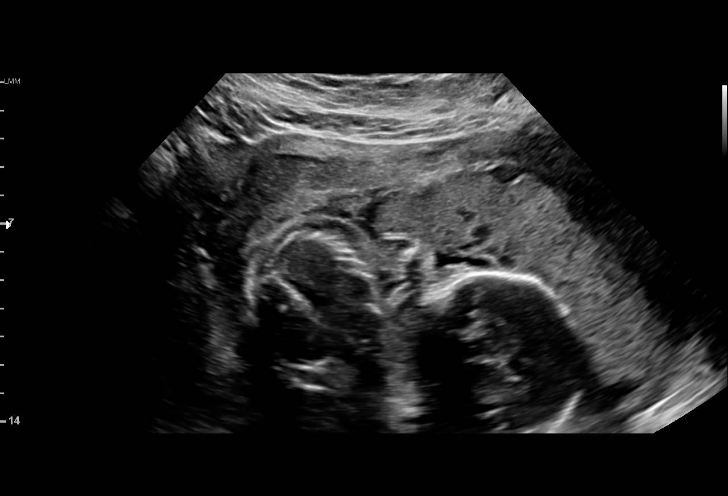
[im 17/32]
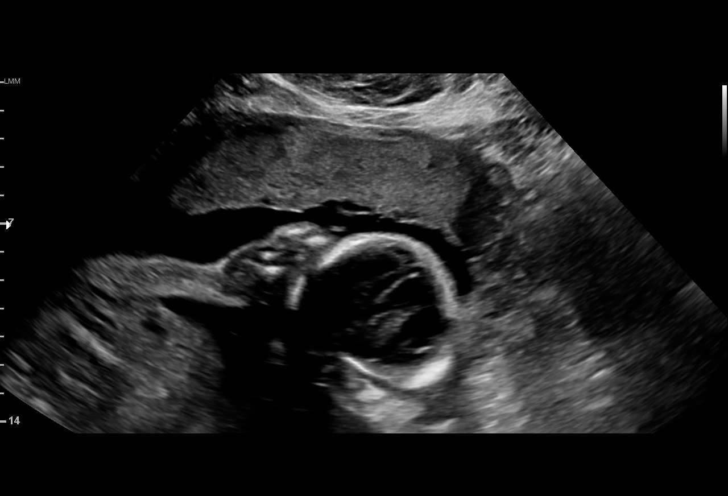
[im 18/32]
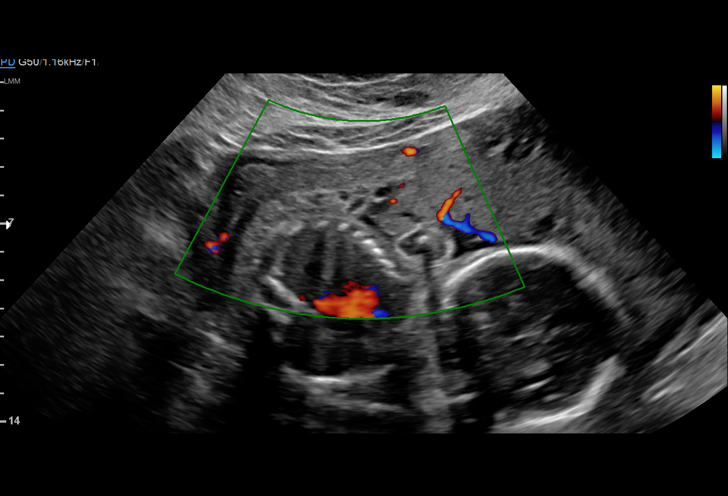
[im 20/32]
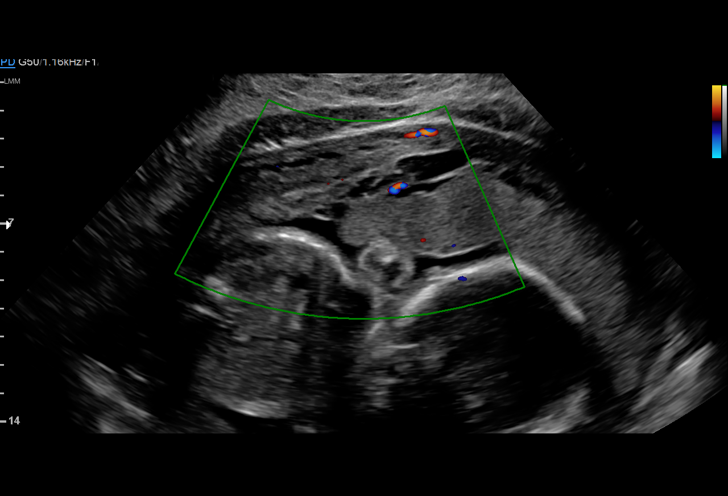
[im 22/32]
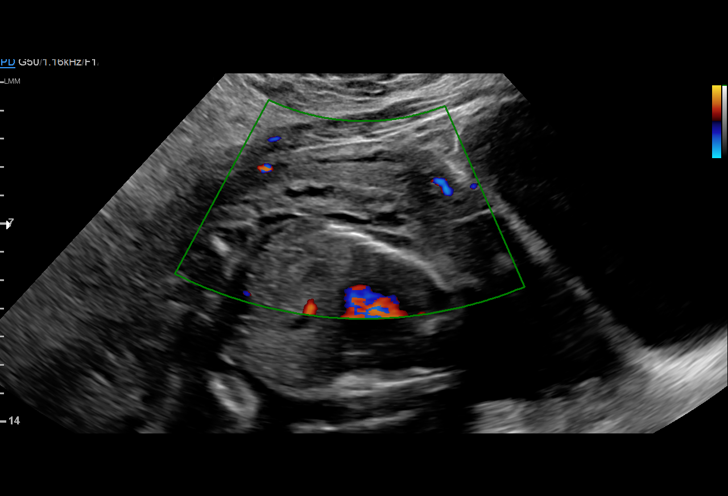
[im 25/32]
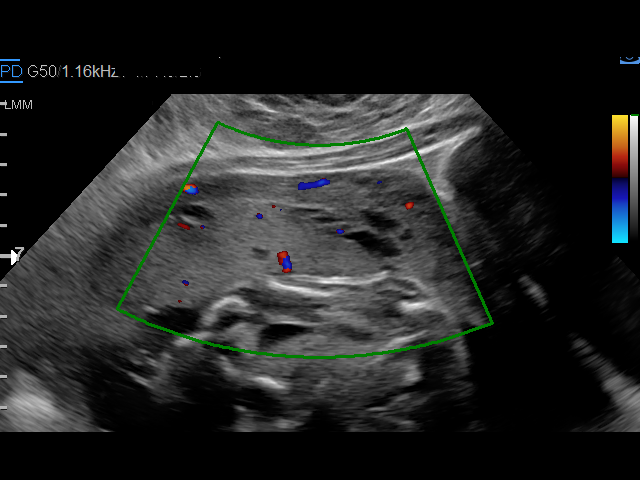
[im 27/32]
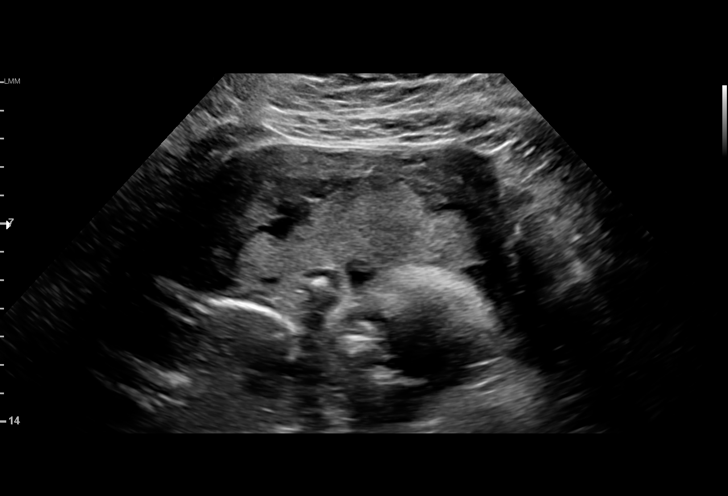
[im 29/32]
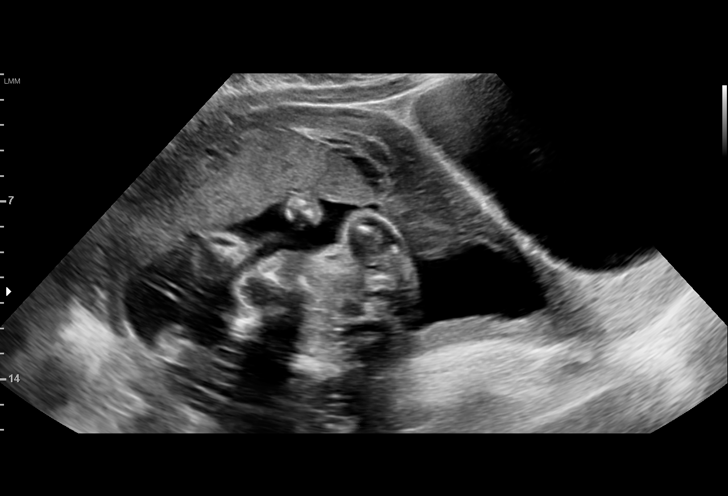
[im 32/32]
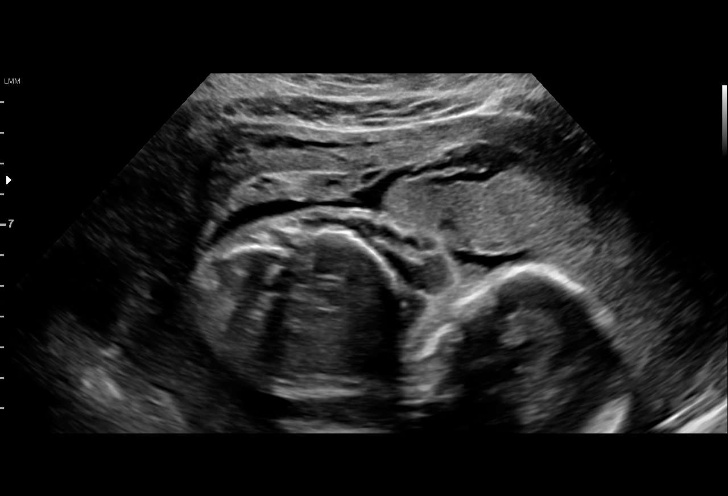

[15 of 28 positions shown; findings below may reference images not displayed]

Attending:        Bosanski Dal         Secondary Phy.:   [id number]-
[facility]/Triage
JANECO CNM

1  JERAMIAH TAI          [id number]      [id number]     [id number]
Indications

24 weeks gestation of pregnancy
Vaginal bleeding in pregnancy, second
trimester
Advanced maternal age multigravida (46),
second trimester-low risk quad
Advanced Paternal Age - (56)
Obesity complicating pregnancy, second
trimester
Gestational diabetes in pregnancy,
controlled by oral hypoglycemic drugs -
glyburide
OB History

Blood Type:            Height:  5'5"   Weight (lb):  203       BMI:
Gravidity:    2         Term:   1        Prem:   0        SAB:   0
TOP:          0       Ectopic:  0        Living: 1
Fetal Evaluation

Num Of Fetuses:     1
Fetal Heart         150
Rate(bpm):
Cardiac Activity:   Observed
Presentation:       Transverse, head to maternal left
Placenta:           Anterior, above cervical os

Amniotic Fluid
AFI FV:      Subjectively within normal limits

Largest Pocket(cm)
6.51
Comment:    Hypoechoic retroplacental area at right lateral margin of
placental with adjacent heterogeneous area suspicious for
marginal abruption.
Gestational Age

LMP:           21w 6d        Date:  04/10/16                 EDD:   01/15/17
Best:          24w 3d     Det. By:  U/S  (08/24/16)          EDD:   12/28/16
Cervix Uterus Adnexa

Cervix
Length:           4.48  cm.
Normal appearance by transabdominal scan.

Uterus
No abnormality visualized.

Cul De Sac:   No free fluid seen.
Impression

Single living intrauterine pregnancy at 15w0d.
Transverse presentation.
Anterior placenta without evidence of previa.
Hypoechoic retroplacental area at right lateral margin of
placental with adjacent heterogeneous area suspicious for
marginal abruption.
Normal amniotic fluid volume.
Recommendations

Remote read, results not discussed with patient.
Recommend clinical correlation.
If undelivered, recommend serial ultrasounds for growth.

## 2019-09-09 ENCOUNTER — Inpatient Hospital Stay (HOSPITAL_COMMUNITY): Payer: Self-pay

## 2019-09-09 ENCOUNTER — Other Ambulatory Visit: Payer: Self-pay

## 2019-09-09 ENCOUNTER — Encounter (HOSPITAL_COMMUNITY): Payer: Self-pay | Admitting: Obstetrics and Gynecology

## 2019-09-09 ENCOUNTER — Inpatient Hospital Stay (HOSPITAL_COMMUNITY)
Admission: AD | Admit: 2019-09-09 | Discharge: 2019-09-09 | Disposition: A | Discharge status: Home / Self Care | Payer: Self-pay | Attending: Obstetrics and Gynecology | Admitting: Obstetrics and Gynecology

## 2019-09-09 DIAGNOSIS — R300 Dysuria: Secondary | ICD-10-CM | POA: Insufficient documentation

## 2019-09-09 DIAGNOSIS — O10011 Pre-existing essential hypertension complicating pregnancy, first trimester: Secondary | ICD-10-CM | POA: Insufficient documentation

## 2019-09-09 DIAGNOSIS — O26891 Other specified pregnancy related conditions, first trimester: Secondary | ICD-10-CM

## 2019-09-09 DIAGNOSIS — R109 Unspecified abdominal pain: Secondary | ICD-10-CM | POA: Insufficient documentation

## 2019-09-09 DIAGNOSIS — M549 Dorsalgia, unspecified: Secondary | ICD-10-CM | POA: Insufficient documentation

## 2019-09-09 DIAGNOSIS — O24111 Pre-existing diabetes mellitus, type 2, in pregnancy, first trimester: Secondary | ICD-10-CM | POA: Insufficient documentation

## 2019-09-09 DIAGNOSIS — Z3A01 Less than 8 weeks gestation of pregnancy: Secondary | ICD-10-CM | POA: Insufficient documentation

## 2019-09-09 DIAGNOSIS — O3680X0 Pregnancy with inconclusive fetal viability, not applicable or unspecified: Secondary | ICD-10-CM

## 2019-09-09 DIAGNOSIS — Z7984 Long term (current) use of oral hypoglycemic drugs: Secondary | ICD-10-CM | POA: Insufficient documentation

## 2019-09-09 DIAGNOSIS — Z79899 Other long term (current) drug therapy: Secondary | ICD-10-CM | POA: Insufficient documentation

## 2019-09-09 DIAGNOSIS — E119 Type 2 diabetes mellitus without complications: Secondary | ICD-10-CM | POA: Insufficient documentation

## 2019-09-09 LAB — URINALYSIS, ROUTINE W REFLEX MICROSCOPIC
Bilirubin Urine: NEGATIVE
Glucose, UA: NEGATIVE mg/dL
Hgb urine dipstick: NEGATIVE
Ketones, ur: NEGATIVE mg/dL
Leukocytes,Ua: NEGATIVE
Nitrite: NEGATIVE
Protein, ur: NEGATIVE mg/dL
Specific Gravity, Urine: 1.024 (ref 1.005–1.030)
pH: 6 (ref 5.0–8.0)

## 2019-09-09 LAB — WET PREP, GENITAL
Clue Cells Wet Prep HPF POC: NONE SEEN
Sperm: NONE SEEN
Trich, Wet Prep: NONE SEEN
Yeast Wet Prep HPF POC: NONE SEEN

## 2019-09-09 LAB — CBC
HCT: 33.6 % — ABNORMAL LOW (ref 36.0–46.0)
Hemoglobin: 9.3 g/dL — ABNORMAL LOW (ref 12.0–15.0)
MCH: 18 pg — ABNORMAL LOW (ref 26.0–34.0)
MCHC: 27.7 g/dL — ABNORMAL LOW (ref 30.0–36.0)
MCV: 64.9 fL — ABNORMAL LOW (ref 80.0–100.0)
Platelets: 495 10*3/uL — ABNORMAL HIGH (ref 150–400)
RBC: 5.18 MIL/uL — ABNORMAL HIGH (ref 3.87–5.11)
RDW: 18 % — ABNORMAL HIGH (ref 11.5–15.5)
WBC: 6.6 10*3/uL (ref 4.0–10.5)
nRBC: 0 % (ref 0.0–0.2)

## 2019-09-09 LAB — POCT PREGNANCY, URINE: Preg Test, Ur: POSITIVE — AB

## 2019-09-09 LAB — HCG, QUANTITATIVE, PREGNANCY: hCG, Beta Chain, Quant, S: 617 m[IU]/mL — ABNORMAL HIGH (ref <5)

## 2019-09-09 NOTE — Progress Notes (Addendum)
Pt given instructions and swabs regarding self swabbing for GC/Chlamydia and wet prep cultures.  Pt verbalized understanding.

## 2019-09-09 NOTE — [facility] Note (Addendum)
Presents with c/o LLQ and RLQ abdominal pain and back pain.. Denies VB.  Reports LMP 07/28/2019.  Has not taken a HPT.

## 2019-09-09 NOTE — MAU Provider Note (Signed)
Chief Complaint: Back Pain and Abdominal Pain   First Provider Initiated Contact with Patient 09/09/19 1154     SUBJECTIVE HPI: [name] is a 49 y.o. [id number] at [redacted]w[redacted]d by LMP who presents to Maternity Admissions reporting abdominal & back pain. Symptoms started a few days ago. LMP was 7/26 & has not taken a pregnancy test at home.  Reports cramping throughout lower abdomen & back. Also endorses dysuria. Denies fever/chills, n/v/d, vaginal bleeding, or vaginal discharge.   Location: abdomen Quality: cramping Severity: 8/10 on pain scale Duration: days Timing: intermittent Modifying factors: none Associated signs and symptoms: dysuria  Past Medical History:  Diagnosis Date  . Hypertension   . Type II diabetes mellitus with complication (HCC)    OB History  Gravida Para Term Preterm AB Living  5 3 3   1 2   SAB TAB Ectopic Multiple Live Births  1     0 3    # Outcome Date GA Lbr Len/2nd Weight Sex Delivery Anes PTL Lv  5 Current           4 SAB 04/2019          3 Term 08/18/18 [redacted]w[redacted]d 05:31 / 00:02 2450 g M Vag-Spont None  LIV  2 Term 12/14/16 [redacted]w[redacted]d 02:32 / 00:11 2800 g M Vag-Vacuum None  LIV     Birth Comments: Abruption     Complications: Abruptio Placenta  1 Term 08/11/97 [redacted]w[redacted]d  3175 g F Vag-Spont   DEC     Complications: Malaria   Past Surgical History:  Procedure Laterality Date  . DILATION AND CURETTAGE OF UTERUS    . HYSTEROSCOPY WITH RESECTOSCOPE  2011   Social History   Socioeconomic History  . Marital status: Married    Spouse name: Not on file  . Number of children: Not on file  . Years of education: Not on file  . Highest education level: Not on file  Occupational History  . Not on file  Tobacco Use  . Smoking status: Never Smoker  . Smokeless tobacco: Never Used  Vaping Use  . Vaping Use: Never used  Substance and Sexual Activity  . Alcohol use: No  . Drug use: No  . Sexual activity: Not Currently    Birth control/protection: None  Other Topics  Concern  . Not on file  Social History Narrative  . Not on file   Social Determinants of Health   Financial Resource Strain:   . Difficulty of Paying Living Expenses: Not on file  Food Insecurity:   . Worried About 2012 in the Last Year: Not on file  . Ran Out of Food in the Last Year: Not on file  Transportation Needs:   . Lack of Transportation (Medical): Not on file  . Lack of Transportation (Non-Medical): Not on file  Physical Activity:   . Days of Exercise per Week: Not on file  . Minutes of Exercise per Session: Not on file  Stress:   . Feeling of Stress : Not on file  Social Connections:   . Frequency of Communication with Friends and Family: Not on file  . Frequency of Social Gatherings with Friends and Family: Not on file  . Attends Religious Services: Not on file  . Active Member of Clubs or Organizations: Not on file  . Attends Programme researcher, broadcasting/film/video Meetings: Not on file  . Marital Status: Not on file  Intimate Partner Violence:   . Fear of Current or Ex-Partner: Not on  file  . Emotionally Abused: Not on file  . Physically Abused: Not on file  . Sexually Abused: Not on file   Family History  Problem Relation Age of Onset  . Alcohol abuse Neg Hx    No current facility-administered medications on file prior to encounter.   Current Outpatient Medications on File Prior to Encounter  Medication Sig Dispense Refill  . acetaminophen (TYLENOL) 325 MG tablet Take 650 mg by mouth every 6 (six) hours as needed.    Marland Kitchen atorvastatin (LIPITOR) 40 MG tablet Take 1 tablet (40 mg total) by mouth daily. 90 tablet 3  . enalapril (VASOTEC) 20 MG tablet Take 2 tablets (40 mg total) by mouth daily. 60 tablet 2  . glucose blood (TRUE METRIX BLOOD GLUCOSE TEST) test strip Use as instructed. Check blood glucose level by fingerstick twice per day. 100 each 12  . ibuprofen (ADVIL) 800 MG tablet Take 1 tablet (800 mg total) by mouth 3 (three) times daily. 21 tablet 0  .  metFORMIN (GLUCOPHAGE) 500 MG tablet Take 2 tablets (1,000 mg total) by mouth 2 (two) times daily with a meal. Please fill as a 90 day supply 120 tablet 1  . norethindrone (MICRONOR) 0.35 MG tablet Take 1 tablet (0.35 mg total) by mouth daily. 3 Package 3  . Prenatal Vit-Fe Fumarate-FA (MULTIVITAMIN-PRENATAL) 27-0.8 MG TABS tablet Take 1 tablet by mouth daily at 12 noon.    . senna-docusate (SENOKOT-S) 8.6-50 MG tablet Take 2 tablets by mouth at bedtime as needed for mild constipation or moderate constipation. (Patient not taking: Reported on 09/30/2018) 30 tablet 2  . TRUEplus Lancets 28G MISC Use as instructed. Check blood glucose level by fingerstick twice per day. 100 each 3   No Known Allergies  I have reviewed patient's Past Medical Hx, Surgical Hx, Family Hx, Social Hx, medications and allergies.   Review of Systems  Constitutional: Negative.   Gastrointestinal: Positive for abdominal pain. Negative for constipation, diarrhea, nausea and vomiting.  Genitourinary: Positive for dysuria. Negative for flank pain, frequency, hematuria, vaginal bleeding and vaginal discharge.  Musculoskeletal: Positive for back pain.    OBJECTIVE Patient Vitals for the past 24 hrs:  BP Temp Temp src Pulse Resp SpO2 Height Weight  09/09/19 1121 138/79 98.9 F (37.2 C) Oral 83 20 100 % -- --  09/09/19 1115 -- -- -- -- -- -- 5' 5.5" (1.664 m) 91.8 kg   Constitutional: Well-developed, well-nourished female in no acute distress.  Cardiovascular: normal rate & rhythm, no murmur Respiratory: normal rate and effort. Lung sounds clear throughout GI: No CVA tenderness.  Abd soft, non-tender, Pos BS x 4. No guarding or rebound tenderness MS: Extremities nontender, no edema, normal ROM Neurologic: Alert and oriented x 4.    LAB RESULTS Results for orders placed or performed during the hospital encounter of 09/09/19 (from the past 24 hour(s))  Urinalysis, Routine w reflex microscopic Urine, Clean Catch      Status: None   Collection Time: 09/09/19 11:22 AM  Result Value Ref Range   Color, Urine YELLOW YELLOW   APPearance CLEAR CLEAR   Specific Gravity, Urine 1.024 1.005 - 1.030   pH 6.0 5.0 - 8.0   Glucose, UA NEGATIVE NEGATIVE mg/dL   Hgb urine dipstick NEGATIVE NEGATIVE   Bilirubin Urine NEGATIVE NEGATIVE   Ketones, ur NEGATIVE NEGATIVE mg/dL   Protein, ur NEGATIVE NEGATIVE mg/dL   Nitrite NEGATIVE NEGATIVE   Leukocytes,Ua NEGATIVE NEGATIVE  Pregnancy, urine POC     Status:  Abnormal   Collection Time: 09/09/19 11:23 AM  Result Value Ref Range   Preg Test, Ur POSITIVE (A) NEGATIVE  Wet prep, genital     Status: Abnormal   Collection Time: 09/09/19 12:16 PM   Specimen: PATH Cytology Cervicovaginal Ancillary Only  Result Value Ref Range   Yeast Wet Prep HPF POC NONE SEEN NONE SEEN   Trich, Wet Prep NONE SEEN NONE SEEN   Clue Cells Wet Prep HPF POC NONE SEEN NONE SEEN   WBC, Wet Prep HPF POC MODERATE (A) NONE SEEN   Sperm NONE SEEN   CBC     Status: Abnormal   Collection Time: 09/09/19 12:18 PM  Result Value Ref Range   WBC 6.6 4.0 - 10.5 K/uL   RBC 5.18 (H) 3.87 - 5.11 MIL/uL   Hemoglobin 9.3 (L) 12.0 - 15.0 g/dL   HCT 86.5 (L) 36 - 46 %   MCV 64.9 (L) 80.0 - 100.0 fL   MCH 18.0 (L) 26.0 - 34.0 pg   MCHC 27.7 (L) 30.0 - 36.0 g/dL   RDW 78.4 (H) 69.6 - 29.5 %   Platelets 495 (H) 150 - 400 K/uL   nRBC 0.0 0.0 - 0.2 %  hCG, quantitative, pregnancy     Status: Abnormal   Collection Time: 09/09/19 12:18 PM  Result Value Ref Range   hCG, Beta Chain, Quant, S 617 (H) <5 mIU/mL    IMAGING US OB LESS THAN 14 WEEKS WITH OB TRANSVAGINAL  Result Date: 09/09/2019 CLINICAL DATA:  Bilateral lower abdominal pain, pregnancy. EXAM: OBSTETRIC <14 WK [place] AND TRANSVAGINAL OB US TECHNIQUE: Both transabdominal and transvaginal ultrasound examinations were performed for complete evaluation of the gestation as well as the maternal uterus, adnexal regions, and pelvic cul-de-sac. Transvaginal  technique was performed to assess early pregnancy. COMPARISON:  April 18, 2019. FINDINGS: Intrauterine gestational sac: None Yolk sac:  Not Visualized. Embryo:  Not Visualized. Cardiac Activity: Not Visualized. Subchorionic hemorrhage:  None visualized. Maternal uterus/adnexae: Right ovary is unremarkable. No free fluid is noted. 3.4 cm cyst is noted in left ovary. IMPRESSION: No intrauterine gestational sac, yolk sac, fetal pole, or cardiac activity visualized. Differential considerations include intrauterine gestation too early to be sonographically visualized, spontaneous abortion, or ectopic pregnancy. Consider follow-up ultrasound in 14 days and serial quantitative beta HCG follow-up. Electronically Signed   By: Lupita Raider M.D.   On: 09/09/2019 12:55    MAU COURSE Orders Placed This Encounter  Procedures  . Wet prep, genital  . Culture, OB Urine  . US OB LESS THAN 14 WEEKS WITH OB TRANSVAGINAL  . Urinalysis, Routine w reflex microscopic Urine, Clean Catch  . CBC  . hCG, quantitative, pregnancy  . Diet NPO time specified  . Pregnancy, urine POC  . Discharge patient   No orders of the defined types were placed in this encounter.   MDM +UPT UA, wet prep, GC/chlamydia, CBC, ABO/Rh, quant hCG, and [place] today to rule out ectopic pregnancy which can be life threatening.   U/a negative. Patient reporting symptoms. Urine culture ordered  Wet prep negative  Ultrasound shows no IUP or adnexal mass. HCG today is 617. Can't exclude ectopic pregnancy at this time. Will bring back for stat HCG on Friday.   ASSESSMENT 1. Pregnancy of unknown anatomic location   2. Abdominal pain during pregnancy in first trimester     PLAN Discharge home in stable condition. Ectopic vs SAB precautions GC/CT & urine culture pending [facility]   Follow-up Information    [facility] Go on 09/11/2019.   Specialty:  Obstetrics and Gynecology Why: at 830 am for blood work SolicitorContact information: [address] [place] [phone]             Allergies as of 09/09/2019   No Known Allergies     Medication List    STOP taking these medications   atorvastatin 40 MG tablet Commonly known as: LIPITOR   enalapril 20 MG tablet Commonly known as: VASOTEC   ibuprofen 800 MG tablet Commonly known as: ADVIL   norethindrone 0.35 MG tablet Commonly known as: MICRONOR   senna-docusate 8.6-50 MG tablet Commonly known as: Senokot-S     TAKE these medications   acetaminophen 325 MG tablet Commonly known as: TYLENOL Take 650 mg by mouth every 6 (six) hours as needed.   metFORMIN 500 MG tablet Commonly known as: GLUCOPHAGE Take 2 tablets (1,000 mg total) by mouth 2 (two) times daily with a meal. Please fill as a 90 day supply   multivitamin-prenatal 27-0.8 MG Tabs tablet Take 1 tablet by mouth daily at 12 noon.   True Metrix Blood Glucose Test test strip Generic drug: glucose blood Use as instructed. Check blood glucose level by fingerstick twice per day.   TRUEplus Lancets 28G Misc Use as instructed. Check blood glucose level by fingerstick twice per day.        Judeth HornLawrence, Celsa Nordahl, NP 09/09/2019  3:10 PM

## 2019-09-09 NOTE — Discharge Instructions (Signed)
Return to care  If you have heavier bleeding that soaks through more that 2 pads per hour for an hour or more If you bleed so much that you feel like you might pass out or you do pass out If you have significant abdominal pain that is not improved with Tylenol   

## 2019-09-10 LAB — GC/CHLAMYDIA PROBE AMP (~~LOC~~) NOT AT ARMC
Chlamydia: NEGATIVE
Comment: NEGATIVE
Comment: NORMAL
Neisseria Gonorrhea: NEGATIVE

## 2019-09-10 LAB — CULTURE, OB URINE: Culture: NO GROWTH

## 2019-09-11 ENCOUNTER — Other Ambulatory Visit: Payer: Self-pay

## 2019-09-11 ENCOUNTER — Ambulatory Visit (INDEPENDENT_AMBULATORY_CARE_PROVIDER_SITE_OTHER): Payer: Self-pay

## 2019-09-11 DIAGNOSIS — O3680X0 Pregnancy with inconclusive fetal viability, not applicable or unspecified: Secondary | ICD-10-CM

## 2019-09-11 LAB — BETA HCG QUANT (REF LAB): hCG Quant: 1122 m[IU]/mL

## 2019-09-11 NOTE — Progress Notes (Signed)
[name] presents to [facility] for follow-up beta HCG blood draw today. She was seen in MAU for cramping of abdomen and back on 09/09/19. Patient endorses continued back pain that is improving. Pt is using Tylenol, recommended pt try a heating pad on a low setting or warm bath. Discussed with patient, we are following hCG levels today to determine viability of this pregnancy. Results will be back in approximately 2 hours. Notified pt we will call the patient with results after reviewing with a provider; plan of care to be given at that time.   Beta HCG was 617 on 09/09/19, increased to 1122 today. Results and patient history reviewed with [name]. [name], MD. Patient called and informed of plan for follow-up US on 09/23/19 at 0900; pt to arrive at 0845 with full bladder. Ectopic precautions reviewed. Pt will be scheduled for nurse visit to follow.   [name] 09/11/2019 8:35 AM

## 2019-09-15 NOTE — Progress Notes (Signed)
I have reviewed this chart and agree with the RN/CMA assessment and management.    K. Meryl Dominic Mahaney, M.D. Attending [facility] Practice)   

## 2019-09-23 ENCOUNTER — Ambulatory Visit: Payer: Self-pay

## 2019-09-23 ENCOUNTER — Ambulatory Visit: Admission: RE | Admit: 2019-09-23 | Payer: Self-pay | Source: Ambulatory Visit

## 2020-01-05 ENCOUNTER — Other Ambulatory Visit: Payer: Self-pay

## 2020-01-05 ENCOUNTER — Encounter: Payer: Self-pay | Admitting: Family Medicine

## 2020-01-05 ENCOUNTER — Other Ambulatory Visit: Payer: Self-pay | Admitting: Family Medicine

## 2020-01-05 ENCOUNTER — Ambulatory Visit (INDEPENDENT_AMBULATORY_CARE_PROVIDER_SITE_OTHER): Payer: Self-pay | Admitting: Family Medicine

## 2020-01-05 VITALS — BP 131/88 | HR 75 | Wt 182.6 lb

## 2020-01-05 DIAGNOSIS — Z3202 Encounter for pregnancy test, result negative: Secondary | ICD-10-CM

## 2020-01-05 DIAGNOSIS — Z30011 Encounter for initial prescription of contraceptive pills: Secondary | ICD-10-CM

## 2020-01-05 DIAGNOSIS — Z0000 Encounter for general adult medical examination without abnormal findings: Secondary | ICD-10-CM

## 2020-01-05 LAB — POCT PREGNANCY, URINE: Preg Test, Ur: NEGATIVE

## 2020-01-05 MED ORDER — NORETHINDRONE 0.35 MG PO TABS: 1.0000 | ORAL_TABLET | Freq: Every day | ORAL | 11 refills | Status: DC

## 2020-01-05 MED FILL — NORETHINDRONE 0.35 MG TABS: 0.35 | 28 days supply | Qty: 28 | Fill #0

## 2020-01-05 NOTE — Progress Notes (Signed)
No Language Resources, AMN, or [employer] interpreter available. Pt's husband on speaker phone to interpret.   Fleet Contras RN 01/05/20

## 2020-01-05 NOTE — Progress Notes (Signed)
   GYNECOLOGY OFFICE VISIT NOTE  History:   [name] is a 49 y.o. [id number] here today for follow up of miscarriage.  Patient last seen at [facility] in 09/2019 At that time had pregnancy of unknown location with no US findings and appropriately rising HCG Reports that since that time she returned home and had a miscar[place], we do not have records of this) Today reports she would like to "rest" before getting pregnant again Does not currently have insurance  Past Medical History:  Diagnosis Date  . Hypertension   . Type II diabetes mellitus with complication [facility])     Past Surgical History:  Procedure Laterality Date  . DILATION AND CURETTAGE OF UTERUS    . HYSTEROSCOPY WITH RESECTOSCOPE  2011    The following portions of the patient's history were reviewed and updated as appropriate: allergies, current medications, past family history, past medical history, past social history, past surgical history and problem list.   Health Maintenance:  Normal pap and negative HRHPV: 08/2016.  Normal mammogram: none on file.   Review of Systems:  Pertinent items noted in HPI and remainder of comprehensive ROS otherwise negative.  Physical Exam:  BP 131/88   Pulse 75   Wt 182 lb 9.6 oz (82.8 kg)   LMP 12/23/2019 (Approximate)   Breastfeeding No   BMI 29.92 kg/m  CONSTITUTIONAL: Well-developed, well-nourished female in no acute distress.  HEENT:  Normocephalic, atraumatic. External right and left ear normal. No scleral icterus.  NECK: Normal range of motion, supple, no masses noted on observation SKIN: No rash noted. Not diaphoretic. No erythema. No pallor. MUSCULOSKELETAL: Normal range of motion. No edema noted. NEUROLOGIC: Alert and oriented to person, place, and time. Normal muscle tone coordination.  PSYCHIATRIC: Normal mood and affect. Normal behavior. Normal judgment and thought content. RESPIRATORY: Effort normal, no problems with respiration noted PELVIC:  Deferred  Labs and Imaging No results found for this or any previous visit (from the past 168 hour(s)). No results found.    Assessment and Plan:   Problem List Items Addressed This Visit      Other   Encounter for initial prescription of contraceptive pills - Primary    Counseled on options including LARC, after much discussion elects for micronor.  Can go to [facility] for LARC if changes her mind UPT neg      Relevant Medications   norethindrone (MICRONOR) 0.35 MG tablet   Routine health maintenance    Due for pap and mammo Advised to go to [facility] for routine screening         Routine preventative health maintenance measures emphasized. Please refer to After Visit Summary for other counseling recommendations.   Return if symptoms worsen or fail to improve.    Total face-to-face time with patient: 15 minutes.  Over 50% of encounter was spent on counseling and coordination of care.   Venora Maples, MD/MPH [facility] for [employer], [facility]

## 2020-01-05 NOTE — Assessment & Plan Note (Signed)
Due for pap and mammo Advised to go to [facility] for routine screening

## 2020-01-05 NOTE — Patient Instructions (Addendum)
YOU ARE DUE TO GET A PAP SMEAR, PLEASE CALL THE [facility] TO SCHEDULE A VISIT FOR A PAP SMEAR  IF YOU DECIDE YOU WANT A LONGER ACTING FORM OF BIRTH CONTROL, CALL THE [facility] TO MAKE A VISIT   Contraception Choices Contraception, also called birth control, means things to use or ways to try not to get pregnant. Hormonal birth control This kind of birth control uses hormones. Here are some types of hormonal birth control:  A tube that is put under skin of the arm (implant). The tube can stay in for as long as 3 years.  Shots to get every 3 months (injections).  Pills to take every day (birth control pills).  A patch to change 1 time each week for 3 weeks (birth control patch). After that, the patch is taken off for 1 week.  A ring to put in the vagina. The ring is left in for 3 weeks. Then it is taken out of the vagina for 1 week. Then a new ring is put in.  Pills to take after unprotected sex (emergency birth control pills). Barrier birth control Here are some types of barrier birth control:  A thin covering that is put on the penis before sex (female condom). The covering is thrown away after sex.  A soft, loose covering that is put in the vagina before sex (female condom). The covering is thrown away after sex.  A rubber bowl that sits over the cervix (diaphragm). The bowl must be made for you. The bowl is put into the vagina before sex. The bowl is left in for 6-8 hours after sex. It is taken out within 24 hours.  A small, soft cup that fits over the cervix (cervical cap). The cup must be made for you. The cup can be left in for 6-8 hours after sex. It is taken out within 48 hours.  A sponge that is put into the vagina before sex. It must be left in for at least 6 hours after sex. It must be taken out within 30 hours. Then it is thrown away.  A chemical that kills or stops sperm from getting into the uterus (spermicide). It may be a  pill, cream, jelly, or foam to put in the vagina. The chemical should be used at least 10-15 minutes before sex. IUD (intrauterine) birth control An IUD is a small, T-shaped piece of plastic. It is put inside the uterus. There are two kinds:  Hormone IUD. This kind can stay in for 3-5 years.  Copper IUD. This kind can stay in for 10 years. Permanent birth control Here are some types of permanent birth control:  Surgery to block the fallopian tubes.  Having an insert put into each fallopian tube.  Surgery to tie off the tubes that carry sperm (vasectomy). Natural planning birth control Here are some types of natural planning birth control:  Not having sex on the days the woman could get pregnant.  Using a calendar: ? To keep track of the length of each period. ? To find out what days pregnancy can happen. ? To plan to not have sex on days when pregnancy can happen.  Watching for symptoms of ovulation and not having sex during ovulation. One way the woman can check for ovulation is to check her temperature.  Waiting to have sex until after ovulation. Summary  Contraception, also called birth control, means things to use or ways to try not to get  pregnant.  Hormonal methods of birth control include implants, injections, pills, patches, vaginal rings, and emergency birth control pills.  Barrier methods of birth control can include female condoms, female condoms, diaphragms, cervical caps, sponges, and spermicides.  There are two types of IUD (intrauterine device) birth control. An IUD can be put in a woman's uterus to prevent pregnancy for 3-5 years.  Permanent sterilization can be done through a procedure for males, females, or both.  Natural planning methods involve not having sex on the days when the woman could get pregnant. This information is not intended to replace advice given to you by your health care provider. Make sure you discuss any questions you have with your health  care provider. Document Revised: 04/16/2018 Document Reviewed: 01/05/2016 Elsevier Patient Education  2020 [employer].

## 2020-01-05 NOTE — Assessment & Plan Note (Signed)
Counseled on options including LARC, after much discussion elects for micronor.  Can go to [facility] for LARC if changes her mind UPT neg

## 2020-02-01 MED FILL — NORETHINDRONE 0.35 MG TABS: 0.35 | 84 days supply | Qty: 84 | Fill #1

## 2020-04-11 ENCOUNTER — Other Ambulatory Visit: Payer: Self-pay

## 2020-04-11 MED FILL — Norethindrone Tab 0.35 MG: ORAL | 84 days supply | Qty: 84 | Fill #0 | Status: AC

## 2020-08-19 ENCOUNTER — Inpatient Hospital Stay (HOSPITAL_COMMUNITY): Payer: Self-pay

## 2020-08-19 ENCOUNTER — Inpatient Hospital Stay (HOSPITAL_COMMUNITY)
Admission: AD | Admit: 2020-08-19 | Discharge: 2020-08-19 | Disposition: A | Discharge status: Home / Self Care | Payer: Self-pay | Attending: Obstetrics & Gynecology | Admitting: Obstetrics & Gynecology

## 2020-08-19 ENCOUNTER — Other Ambulatory Visit: Payer: Self-pay

## 2020-08-19 ENCOUNTER — Encounter (HOSPITAL_COMMUNITY): Payer: Self-pay | Admitting: Obstetrics & Gynecology

## 2020-08-19 DIAGNOSIS — Z7984 Long term (current) use of oral hypoglycemic drugs: Secondary | ICD-10-CM | POA: Insufficient documentation

## 2020-08-19 DIAGNOSIS — Z3A08 8 weeks gestation of pregnancy: Secondary | ICD-10-CM | POA: Insufficient documentation

## 2020-08-19 DIAGNOSIS — O3680X0 Pregnancy with inconclusive fetal viability, not applicable or unspecified: Secondary | ICD-10-CM | POA: Insufficient documentation

## 2020-08-19 DIAGNOSIS — O26891 Other specified pregnancy related conditions, first trimester: Secondary | ICD-10-CM | POA: Insufficient documentation

## 2020-08-19 DIAGNOSIS — O209 Hemorrhage in early pregnancy, unspecified: Secondary | ICD-10-CM | POA: Insufficient documentation

## 2020-08-19 DIAGNOSIS — R102 Pelvic and perineal pain: Secondary | ICD-10-CM | POA: Insufficient documentation

## 2020-08-19 LAB — URINALYSIS, ROUTINE W REFLEX MICROSCOPIC
Bacteria, UA: NONE SEEN
Bilirubin Urine: NEGATIVE
Glucose, UA: NEGATIVE mg/dL
Ketones, ur: NEGATIVE mg/dL
Leukocytes,Ua: NEGATIVE
Nitrite: NEGATIVE
Protein, ur: NEGATIVE mg/dL
Specific Gravity, Urine: 1.003 — ABNORMAL LOW (ref 1.005–1.030)
pH: 7 (ref 5.0–8.0)

## 2020-08-19 LAB — BASIC METABOLIC PANEL
Anion gap: 11 (ref 5–15)
BUN: 9 mg/dL (ref 6–20)
CO2: 21 mmol/L — ABNORMAL LOW (ref 22–32)
Calcium: 9.5 mg/dL (ref 8.9–10.3)
Chloride: 102 mmol/L (ref 98–111)
Creatinine, Ser: 0.5 mg/dL (ref 0.44–1.00)
GFR, Estimated: >60 mL/min (ref >60)
Glucose, Bld: 139 mg/dL — ABNORMAL HIGH (ref 70–99)
Potassium: 4 mmol/L (ref 3.5–5.1)
Sodium: 134 mmol/L — ABNORMAL LOW (ref 135–145)

## 2020-08-19 LAB — CBC
HCT: 33.4 % — ABNORMAL LOW (ref 36.0–46.0)
Hemoglobin: 9.8 g/dL — ABNORMAL LOW (ref 12.0–15.0)
MCH: 20.2 pg — ABNORMAL LOW (ref 26.0–34.0)
MCHC: 29.3 g/dL — ABNORMAL LOW (ref 30.0–36.0)
MCV: 68.9 fL — ABNORMAL LOW (ref 80.0–100.0)
Platelets: 323 10*3/uL (ref 150–400)
RBC: 4.85 MIL/uL (ref 3.87–5.11)
RDW: 19 % — ABNORMAL HIGH (ref 11.5–15.5)
WBC: 8 10*3/uL (ref 4.0–10.5)
nRBC: 0 % (ref 0.0–0.2)

## 2020-08-19 LAB — GC/CHLAMYDIA PROBE AMP (~~LOC~~) NOT AT ARMC
Chlamydia: NEGATIVE
Comment: NEGATIVE
Comment: NORMAL
Neisseria Gonorrhea: NEGATIVE

## 2020-08-19 LAB — HCG, QUANTITATIVE, PREGNANCY: hCG, Beta Chain, Quant, S: 3247 m[IU]/mL — ABNORMAL HIGH (ref <5)

## 2020-08-19 LAB — POCT PREGNANCY, URINE: Preg Test, Ur: POSITIVE — AB

## 2020-08-19 LAB — WET PREP, GENITAL
Clue Cells Wet Prep HPF POC: NONE SEEN
Sperm: NONE SEEN
Trich, Wet Prep: NONE SEEN
Yeast Wet Prep HPF POC: NONE SEEN

## 2020-08-19 LAB — HIV ANTIBODY (ROUTINE TESTING W REFLEX): HIV Screen 4th Generation wRfx: NONREACTIVE

## 2020-08-19 NOTE — MAU Provider Note (Addendum)
Chief Complaint: Vaginal Bleeding   Event Date/Time   First Provider Initiated Contact with Patient 08/19/20 0421        SUBJECTIVE HPI: [name] is a 50 y.o. [id number] at [redacted]w[redacted]d by LMP who presents to maternity admissions reporting bleeding on tissue.  Having some light cramping.  . She denies vaginal itching/burning, urinary symptoms, h/a, dizziness, n/v, or fever/chills.   States was trying to get pregnant so she can have a girl  Has chronic HTN and T2DM.  Per medical notes, patient is noncompliant with meds and testing.   Vaginal Bleeding The patient's primary symptoms include pelvic pain and vaginal bleeding. The patient's pertinent negatives include no genital itching, genital lesions or genital odor. This is a new problem. The current episode started today. The pain is mild. She is pregnant. Associated symptoms include abdominal pain. Pertinent negatives include no chills, constipation, dysuria, fever or frequency. The vaginal discharge was bloody. The vaginal bleeding is spotting. She has not been passing clots. She has not been passing tissue. Nothing aggravates the symptoms. She has tried nothing for the symptoms.   RN Note: Went to BR about 0100 and saw blood on tissue. LMP 06/22/20. Having some cramping  Past Medical History:  Diagnosis Date   Hypertension    Type II diabetes mellitus with complication (HCC)    Past Surgical History:  Procedure Laterality Date   DILATION AND CURETTAGE OF UTERUS     HYSTEROSCOPY WITH RESECTOSCOPE  2011   Social History   Socioeconomic History   Marital status: Married    Spouse name: Not on file   Number of children: Not on file   Years of education: Not on file   Highest education level: Not on file  Occupational History   Not on file  Tobacco Use   Smoking status: Never   Smokeless tobacco: Never  Vaping Use   Vaping Use: Never used  Substance and Sexual Activity   Alcohol use: No   Drug use: No   Sexual activity: Not  Currently    Birth control/protection: None  Other Topics Concern   Not on file  Social History Narrative   Not on file   Social Determinants of Health   Financial Resource Strain: Not on file  Food Insecurity: Not on file  Transportation Needs: Not on file  Physical Activity: Not on file  Stress: Not on file  Social Connections: Not on file  Intimate Partner Violence: Not on file   No current facility-administered medications on file prior to encounter.   Current Outpatient Medications on File Prior to Encounter  Medication Sig Dispense Refill   acetaminophen (TYLENOL) 325 MG tablet Take 650 mg by mouth every 6 (six) hours as needed. (Patient not taking: Reported on 01/05/2020)     glucose blood (TRUE METRIX BLOOD GLUCOSE TEST) test strip Use as instructed. Check blood glucose level by fingerstick twice per day. (Patient not taking: Reported on 01/05/2020) 100 each 12   metFORMIN (GLUCOPHAGE) 500 MG tablet Take 2 tablets (1,000 mg total) by mouth 2 (two) times daily with a meal. Please fill as a 90 day supply 120 tablet 1   norethindrone (MICRONOR) 0.35 MG tablet TAKE 1 TABLET (0.35 MG TOTAL) BY MOUTH DAILY. 28 tablet 11   Prenatal Vit-Fe Fumarate-FA (MULTIVITAMIN-PRENATAL) 27-0.8 MG TABS tablet Take 1 tablet by mouth daily at 12 noon. (Patient not taking: Reported on 01/05/2020)     TRUEplus Lancets 28G MISC Use as instructed. Check blood glucose level by  fingerstick twice per day. (Patient not taking: Reported on 01/05/2020) 100 each 3   No Known Allergies  I have reviewed patient's Past Medical Hx, Surgical Hx, Family Hx, Social Hx, medications and allergies.   ROS:  Review of Systems  Constitutional:  Negative for chills and fever.  Gastrointestinal:  Positive for abdominal pain. Negative for constipation.  Genitourinary:  Positive for pelvic pain and vaginal bleeding. Negative for dysuria and frequency.  Review of Systems  Other systems negative   Physical Exam   Physical Exam Patient Vitals for the past 24 hrs:  BP Temp Pulse Resp SpO2 Height Weight  08/19/20 0346 (!) 169/80 98.5 F (36.9 C) 75 16 100 % 5\' 5"  (1.651 m) 94.8 kg   Constitutional: Well-developed, well-nourished female in no acute distress.  Cardiovascular: normal rate Respiratory: normal effort GI: Abd soft, non-tender. MS: Extremities nontender, no edema, normal ROM Neurologic: Alert and oriented x 4.  GU: Neg CVAT.  PELVIC EXAM: managed from triage due to high patient acuity and census.  Patient did her own self swabs.  States not bleeding now   LAB RESULTS Results for orders placed or performed during the hospital encounter of 08/19/20 (from the past 24 hour(s))  Urinalysis, Routine w reflex microscopic Urine, Clean Catch     Status: Abnormal   Collection Time: 08/19/20  2:22 AM  Result Value Ref Range   Color, Urine COLORLESS (A) YELLOW   APPearance CLEAR CLEAR   Specific Gravity, Urine 1.003 (L) 1.005 - 1.030   pH 7.0 5.0 - 8.0   Glucose, UA NEGATIVE NEGATIVE mg/dL   Hgb urine dipstick SMALL (A) NEGATIVE   Bilirubin Urine NEGATIVE NEGATIVE   Ketones, ur NEGATIVE NEGATIVE mg/dL   Protein, ur NEGATIVE NEGATIVE mg/dL   Nitrite NEGATIVE NEGATIVE   Leukocytes,Ua NEGATIVE NEGATIVE   RBC / HPF 0-5 0 - 5 RBC/hpf   WBC, UA 0-5 0 - 5 WBC/hpf   Bacteria, UA NONE SEEN NONE SEEN   Squamous Epithelial / LPF 0-5 0 - 5   Mucus PRESENT   Pregnancy, urine POC     Status: Abnormal   Collection Time: 08/19/20  2:32 AM  Result Value Ref Range   Preg Test, Ur POSITIVE (A) NEGATIVE  CBC     Status: Abnormal   Collection Time: 08/19/20  3:29 AM  Result Value Ref Range   WBC 8.0 4.0 - 10.5 K/uL   RBC 4.85 3.87 - 5.11 MIL/uL   Hemoglobin 9.8 (L) 12.0 - 15.0 g/dL   HCT 10/19/20 (L) 02.7 - 25.3 %   MCV 68.9 (L) 80.0 - 100.0 fL   MCH 20.2 (L) 26.0 - 34.0 pg   MCHC 29.3 (L) 30.0 - 36.0 g/dL   RDW 66.4 (H) 40.3 - 47.4 %   Platelets 323 150 - 400 K/uL   nRBC 0.0 0.0 - 0.2 %  hCG,  quantitative, pregnancy     Status: Abnormal   Collection Time: 08/19/20  3:29 AM  Result Value Ref Range   hCG, Beta Chain, Quant, S 3,247 (H) <5 mIU/mL  Basic metabolic panel     Status: Abnormal   Collection Time: 08/19/20  3:29 AM  Result Value Ref Range   Sodium 134 (L) 135 - 145 mmol/L   Potassium 4.0 3.5 - 5.1 mmol/L   Chloride 102 98 - 111 mmol/L   CO2 21 (L) 22 - 32 mmol/L   Glucose, Bld 139 (H) 70 - 99 mg/dL   BUN 9 6 - 20  mg/dL   Creatinine, Ser 4.25 0.44 - 1.00 mg/dL   Calcium 9.5 8.9 - 95.6 mg/dL   GFR, Estimated >38 >75 mL/min   Anion gap 11 5 - 15  Wet prep, genital     Status: Abnormal   Collection Time: 08/19/20  4:10 AM   Specimen: Vaginal  Result Value Ref Range   Yeast Wet Prep HPF POC NONE SEEN NONE SEEN   Trich, Wet Prep NONE SEEN NONE SEEN   Clue Cells Wet Prep HPF POC NONE SEEN NONE SEEN   WBC, Wet Prep HPF POC FEW (A) NONE SEEN   Sperm NONE SEEN      IMAGING US OB LESS THAN 14 WEEKS WITH OB TRANSVAGINAL  Result Date: 08/19/2020 CLINICAL DATA:  50 year old female with pain and vaginal bleeding in the 1st trimester of pregnancy. Estimated gestational age by LMP 8 weeks and 2 days. Quantitative beta HCG pending at this time. EXAM: OBSTETRIC <14 WK [place] AND TRANSVAGINAL OB US TECHNIQUE: Both transabdominal and transvaginal ultrasound examinations were performed for complete evaluation of the gestation as well as the maternal uterus, adnexal regions, and pelvic cul-de-sac. Transvaginal technique was performed to assess early pregnancy. COMPARISON:  None relevant. FINDINGS: Intrauterine gestational sac: Single (image 19), located to the left of midline near the lower body of the uterus. Yolk sac:  Not visible Embryo:  Not visible Cardiac Activity: Not detected Heart Rate: Not applicable bpm MSD: 8.5 mm   5 w   4 d Subchorionic hemorrhage:  None visualized. Maternal uterus/adnexae: The right ovary appears normal measuring 3.9 x 1.9 x 1.5 cm. The left ovary could not be  identified. Trace simple appearing free fluid in the cul-de-sac. IMPRESSION: Probable early intrauterine gestational sac, but no yolk sac, fetal pole, or cardiac activity yet visualized. Recommend follow-up quantitative B-HCG levels and follow-up US in 14 days to confirm and assess viability. This recommendation follows SRU consensus guidelines: Diagnostic Criteria for Nonviable Pregnancy Early in the First Trimester. Malva Limes Med 2013; 643:3295-18. Electronically Signed   By: Odessa Fleming M.D.   On: 08/19/2020 04:56     MAU Management/MDM: Ordered usual first trimester r/o ectopic labs.   Pelvic cultures done Will check baseline Ultrasound to rule out ectopic.  This bleeding/pain can represent a normal pregnancy with bleeding, spontaneous abortion or even an ectopic which can be life-threatening.  The process as listed above helps to determine which of these is present.  Reviewed findings with patient and her husband.  Discused too early to determine viability or location of pregnancy.  Recommend repeat HCG in 48 hrs then [place] in a week  ASSESSMENT 1. Bleeding in early pregnancy   2. Pregnancy of unknown anatomic location     PLAN Discharge home Plan to repeat HCG level in 48 hours  Will repeat  Ultrasound in about 7-10 days if HCG levels double appropriately  Ectopic precautions  Pt stable at time of discharge. Encouraged to return here if she develops worsening of symptoms, increase in pain, fever, or other concerning symptoms.    Wynelle Bourgeois CNM, MSN Certified Nurse-Midwife 08/19/2020  4:22 AM

## 2020-08-19 NOTE — MAU Note (Signed)
Wynelle Bourgeois CNM in Family Rm to discuss test results and d/c plan. Will return in 2 days for repeat lab or sooner for any concerns

## 2020-08-19 NOTE — MAU Note (Signed)
Wynelle Bourgeois CNM in Triage to see pt and discuss plan of care. Pt then went to u/s

## 2020-08-19 NOTE — Progress Notes (Signed)
Written and verbal d/c instructions given by Wynelle Bourgeois CNM and pt voiced understanding

## 2020-08-19 NOTE — MAU Note (Signed)
Went to BR about 0100 and saw blood on tissue. LMP 06/22/20. Having some cramping

## 2020-08-21 ENCOUNTER — Encounter (HOSPITAL_COMMUNITY): Payer: Self-pay | Admitting: Family Medicine

## 2020-08-21 ENCOUNTER — Inpatient Hospital Stay (HOSPITAL_COMMUNITY): Payer: Self-pay

## 2020-08-21 ENCOUNTER — Other Ambulatory Visit: Payer: Self-pay

## 2020-08-21 ENCOUNTER — Inpatient Hospital Stay (HOSPITAL_COMMUNITY)
Admission: AD | Admit: 2020-08-21 | Discharge: 2020-08-21 | Disposition: A | Discharge status: Home / Self Care | Payer: Self-pay | Attending: Family Medicine | Admitting: Family Medicine

## 2020-08-21 DIAGNOSIS — O039 Complete or unspecified spontaneous abortion without complication: Secondary | ICD-10-CM

## 2020-08-21 DIAGNOSIS — O034 Incomplete spontaneous abortion without complication: Secondary | ICD-10-CM | POA: Insufficient documentation

## 2020-08-21 DIAGNOSIS — O10911 Unspecified pre-existing hypertension complicating pregnancy, first trimester: Secondary | ICD-10-CM | POA: Insufficient documentation

## 2020-08-21 DIAGNOSIS — O209 Hemorrhage in early pregnancy, unspecified: Secondary | ICD-10-CM

## 2020-08-21 DIAGNOSIS — R1030 Lower abdominal pain, unspecified: Secondary | ICD-10-CM | POA: Insufficient documentation

## 2020-08-21 DIAGNOSIS — Z79899 Other long term (current) drug therapy: Secondary | ICD-10-CM | POA: Insufficient documentation

## 2020-08-21 DIAGNOSIS — O26891 Other specified pregnancy related conditions, first trimester: Secondary | ICD-10-CM | POA: Insufficient documentation

## 2020-08-21 DIAGNOSIS — Z3A08 8 weeks gestation of pregnancy: Secondary | ICD-10-CM | POA: Insufficient documentation

## 2020-08-21 LAB — URINALYSIS, ROUTINE W REFLEX MICROSCOPIC
Bacteria, UA: NONE SEEN
Bilirubin Urine: NEGATIVE
Glucose, UA: 50 mg/dL — AB
Ketones, ur: 5 mg/dL — AB
Nitrite: NEGATIVE
Protein, ur: >=300 mg/dL — AB
Specific Gravity, Urine: 1.024 (ref 1.005–1.030)
pH: 6 (ref 5.0–8.0)

## 2020-08-21 LAB — HCG, QUANTITATIVE, PREGNANCY: hCG, Beta Chain, Quant, S: 2953 m[IU]/mL — ABNORMAL HIGH (ref <5)

## 2020-08-21 MED ORDER — TRAMADOL HCL 50 MG PO TABS
50.0000 mg | ORAL_TABLET | Freq: Once | ORAL | 0 refills | Status: AC
  Filled 2020-08-21: qty 10, 3d supply, fill #0

## 2020-08-21 MED ORDER — TRAMADOL HCL 50 MG PO TABS
100.0000 mg | ORAL_TABLET | Freq: Once | ORAL | Status: AC
  Administered 2020-08-21: 100 mg via ORAL
  Filled 2020-08-21: qty 2

## 2020-08-21 MED ORDER — NIFEDIPINE ER OSMOTIC RELEASE 30 MG PO TB24
60.0000 mg | ORAL_TABLET | Freq: Every day | ORAL | Status: DC
  Administered 2020-08-21: 60 mg via ORAL
  Filled 2020-08-21: qty 2

## 2020-08-21 MED ORDER — NIFEDIPINE ER OSMOTIC RELEASE 30 MG PO TB24
30.0000 mg | ORAL_TABLET | Freq: Every day | ORAL | 11 refills | Status: DC
  Filled 2020-08-21: qty 30, 30d supply, fill #0

## 2020-08-21 NOTE — MAU Note (Signed)
Presents with c/o VB, reports VB is heavy and has changed sanitary napkin 4x.  Reports lower abdominal pain/cramping.

## 2020-08-21 NOTE — Discharge Instructions (Signed)
AREA FAMILY PRACTICE PHYSICIANS  [place] ([id number]) [facility] [address] [place] [place], [place] [address] [phone] Mon-Fri 8:30-12:30, 1:30-5:00 Accepting Medicaid [facility] [address] [place], [place] [address] [phone] Accepting [facility] at [facility] [address], [place], [place] [address] [phone] Mon-Fri 8:00-5:30 [place] [facility] [address], [place], [place] [address] [phone] [place], Thur, Fri 8:30-5:00, Wed 10:00-7:00 (closed 1-2pm) Accepting [facility] [address], [place], [place]  [address] Phone - [phone]   Fax - [phone]  [place] [phone]) [facility] [address], [address], [place] [address] [phone] Mon-Fri 8:00-5:00 [facility] Pediatrics at [facility])  [address] Sherian Maroon [place], [place] [address] [phone] Mon-Fri 8:30-5:30, Sat (Oct.-Mar.) 9:00-1:00 Accepting Medicaid  [place] [phone]) [facility] at [facility] [address], [place], [place] [address] [phone] Mon-Fri 8:00-5:00  [place] [phone]) [facility] Medicine at [facility] [address] [place], [place] [address] [phone] Mon-Fri 8:00-5:00 [facility] [place] [address] [place], [place], [place] [address] [phone] Mon-Fri 8:00-5:00 [facility] [address] [place], [place] [address] [phone] Mon-Fri 8:00-5:00 [facility] [address] [place] [address] [phone] Mon-Fri 7:30-5:30  [place] [phone] & [phone]) [facility] [address], [place], [place] [address] [phone] Mon-Thur 8:00-6:00 Accepting [facility]  Medicine [address] [place], [place] [address] [phone] Mon-Thur 7:30-7:30, Fri 7:30-4:30 Accepting [facility] Family Medicine at [facility] [address], [place], [place]  [address] [phone]   Fax - [phone]  [place] [phone] & [phone]) Adult nurse HealthCare at [facility] [address] [place], [place] [address] [phone] Mon-Fri 7:00-5:00 [facility] Family Medicine [address], [place], [place] [address] [phone] Mon-Fri 8:00-5:00 Accepting Medicaid [facility] - [facility] [address] [place], [place], [place] [address] [phone] Mon-Fri 8:00-5:00 Accepting Medicaid  [place] [phone]) [facility] Primary Care at [facility] [address] [place], [place] [address] [phone] Mon-Fri 8:00-5:00 [facility] Family [facility] - [facility] Family Medicine at [facility]) [address], [place], [place] [address] [phone] Mon-Fri 8:00-5:00 Accepting Medicaid [facility] - [facility] ([facility]) [address], [place], [place] [address] [phone] Mon-Fri 8:00-5:30, Sat&Sun by appointment (phones open at 8:30) Accepting [facility] [phone] & [phone]) [facility] [address], [place], [place] [address] [phone] Mon-Thur 8:00-7:00, Fri 8:00-5:00, Sat 8:00-12:00, Sun 9:00-12:00 Accepting Medicaid [facility] - Family Medicine at [facility] [address] [place], [place] [address] [phone] Mon-Thur 8:00-5:00 Accepting Medicaid [facility] at [place] [address] Sherian Maroon [place], [place] [address] [phone] Mon-Fri 8:00-5:30, Sat (Oct.-Mar.) 9:00-1:00 Accepting [facility]  [place] [phone]) [facility] Family Medicine [address]dis [place], [place]  [address] [phone] Mon-Fri 8:00-5:00 Accepting [facility]   [place] [phone]) [facility] at [facility] [address], [place], [place] [address] [phone] Mon-Fri 8:00-5:00 [facility] [address] Lipps [place], [place] [address] [phone] Mon-Fri 8:00-5:00 [facility] - [place] [address] [facility] [address], [place], [place] [address] [phone] Mon-Fri 8:00-5:00 After hours clinic [facility] [address]  [place], [place] [address]) [phone] Mon-Fri 5:00-8:00, Sat 12:00-6:00, Sun 10:00-4:00 Accepting Medicaid [facility]. [address] [place], [place]  [address] [phone]   Fax - [phone]  [place] [phone]) [place] at [facility] [address] [place], [place], [place] [address] [phone] Mon-Fri 8:00-5:00 [place] ([place] [facility] [place]) [address] [place], [place],  [address] [phone] Mon-Thur 8:00-7:00, Fri 8:00-5:00, Sat 8:00-12:00

## 2020-08-21 NOTE — MAU Provider Note (Signed)
Chief Complaint: Vaginal Bleeding   Event Date/Time   First Provider Initiated Contact with Patient 08/21/20 1709      SUBJECTIVE HPI: [name] is a 50 y.o. [id number] at [redacted]w[redacted]d by LMP who presents to maternity admissions reporting increased bleeding, changing 4 pads per day and lower abdominal cramping. She was seen on 08/19/20 with bleeding and had hcg of 3247, US showed early gestational sac, no yolk sac or fetal pole. Pt reports increase in bleeding and pain today. She has not tried any treatments.  She is not currently taking any medications for HTN or DM and does not have a PCP.  She denies any chest pain, shortness of breath, or h/a.   Location: lower abdomen Quality: cramping Severity: 8/10 on pain scale Duration: 1 day Timing: intermittent Modifying factors: none Associated signs and symptoms: vaginal bleeding  HPI  Past Medical History:  Diagnosis Date   Hypertension    Type II diabetes mellitus with complication (HCC)    Past Surgical History:  Procedure Laterality Date   DILATION AND CURETTAGE OF UTERUS     HYSTEROSCOPY WITH RESECTOSCOPE  2011   Social History   Socioeconomic History   Marital status: Married    Spouse name: Not on file   Number of children: Not on file   Years of education: Not on file   Highest education level: Not on file  Occupational History   Not on file  Tobacco Use   Smoking status: Never   Smokeless tobacco: Never  Vaping Use   Vaping Use: Never used  Substance and Sexual Activity   Alcohol use: No   Drug use: No   Sexual activity: Not Currently    Birth control/protection: None  Other Topics Concern   Not on file  Social History Narrative   Not on file   Social Determinants of Health   Financial Resource Strain: Not on file  Food Insecurity: Not on file  Transportation Needs: Not on file  Physical Activity: Not on file  Stress: Not on file  Social Connections: Not on file  Intimate Partner Violence: Not on file   No  current facility-administered medications on file prior to encounter.   Current Outpatient Medications on File Prior to Encounter  Medication Sig Dispense Refill   acetaminophen (TYLENOL) 325 MG tablet Take 650 mg by mouth every 6 (six) hours as needed.     glucose blood (TRUE METRIX BLOOD GLUCOSE TEST) test strip Use as instructed. Check blood glucose level by fingerstick twice per day. 100 each 12   metFORMIN (GLUCOPHAGE) 500 MG tablet Take 2 tablets (1,000 mg total) by mouth 2 (two) times daily with a meal. Please fill as a 90 day supply 120 tablet 1   Prenatal Vit-Fe Fumarate-FA (MULTIVITAMIN-PRENATAL) 27-0.8 MG TABS tablet Take 1 tablet by mouth daily at 12 noon.     TRUEplus Lancets 28G MISC Use as instructed. Check blood glucose level by fingerstick twice per day. 100 each 3   No Known Allergies  ROS:  Review of Systems  Constitutional:  Negative for chills, fatigue and fever.  Respiratory:  Negative for shortness of breath.   Cardiovascular:  Negative for chest pain.  Gastrointestinal:  Positive for abdominal pain.  Genitourinary:  Positive for pelvic pain and vaginal bleeding. Negative for difficulty urinating, dysuria, flank pain, vaginal discharge and vaginal pain.  Neurological:  Negative for dizziness and headaches.  Psychiatric/Behavioral: Negative.      I have reviewed patient's Past Medical Hx, Surgical Hx,  Family Hx, Social Hx, medications and allergies.   Physical Exam  Patient Vitals for the past 24 hrs:  BP Temp Temp src Pulse Resp SpO2 Weight  08/21/20 1657 (!) 165/81 -- -- 85 19 99 % --  08/21/20 1613 (!) 167/88 99.3 F (37.4 C) Oral 89 20 99 % --  08/21/20 1607 -- -- -- -- -- -- 94.5 kg   Constitutional: Well-developed, well-nourished female in no acute distress.  Cardiovascular: normal rate Respiratory: normal effort GI: Abd soft, non-tender. Pos BS x 4 MS: Extremities nontender, no edema, normal ROM Neurologic: Alert and oriented x 4.  GU: Neg  CVAT.  PELVIC EXAM: Pads reviewed and 1 pad 1/4 soaked with dark red bleeding without clots in 1 hour in MAU    LAB RESULTS Results for orders placed or performed during the hospital encounter of 08/21/20 (from the past 24 hour(s))  hCG, quantitative, pregnancy     Status: Abnormal   Collection Time: 08/21/20  3:30 PM  Result Value Ref Range   hCG, Beta Chain, Quant, S 2,953 (H) <5 mIU/mL  Urinalysis, Routine w reflex microscopic Urine, Clean Catch     Status: Abnormal   Collection Time: 08/21/20  4:15 PM  Result Value Ref Range   Color, Urine RED (A) YELLOW   APPearance CLOUDY (A) CLEAR   Specific Gravity, Urine 1.024 1.005 - 1.030   pH 6.0 5.0 - 8.0   Glucose, UA 50 (A) NEGATIVE mg/dL   Hgb urine dipstick MODERATE (A) NEGATIVE   Bilirubin Urine NEGATIVE NEGATIVE   Ketones, ur 5 (A) NEGATIVE mg/dL   Protein, ur >=389 (A) NEGATIVE mg/dL   Nitrite NEGATIVE NEGATIVE   Leukocytes,Ua TRACE (A) NEGATIVE   Bacteria, UA NONE SEEN NONE SEEN       IMAGING US OB Transvaginal  Result Date: 08/21/2020 CLINICAL DATA:  Vaginal bleeding EXAM: TRANSVAGINAL OB ULTRASOUND TECHNIQUE: Transvaginal ultrasound was performed for complete evaluation of the gestation as well as the maternal uterus, adnexal regions, and pelvic cul-de-sac. COMPARISON:  08/19/2020 FINDINGS: Intrauterine gestational sac: Gestational sac is seen although it has migrated inferiorly into the region of the cervix suspicious for a miscarriage in progress. Yolk sac:  Absent Embryo:  Absent MSD: 5.9 mm mm   5 w   2 d Subchorionic hemorrhage:  None visualized. Maternal uterus/adnexae: Ovaries appear within normal limits. IMPRESSION: Interval migration of the gestational sac inferiorly into the region of the cervix when compared with the prior exam. These changes are suspicious for a miscarriage in progress. The need for further evaluation can be determined on a clinical basis. Electronically Signed   By: Alcide Clever M.D.   On:  08/21/2020 18:00   US OB LESS THAN 14 WEEKS WITH OB TRANSVAGINAL  Result Date: 08/19/2020 CLINICAL DATA:  50 year old female with pain and vaginal bleeding in the 1st trimester of pregnancy. Estimated gestational age by LMP 8 weeks and 2 days. Quantitative beta HCG pending at this time. EXAM: OBSTETRIC <14 WK [place] AND TRANSVAGINAL OB US TECHNIQUE: Both transabdominal and transvaginal ultrasound examinations were performed for complete evaluation of the gestation as well as the maternal uterus, adnexal regions, and pelvic cul-de-sac. Transvaginal technique was performed to assess early pregnancy. COMPARISON:  None relevant. FINDINGS: Intrauterine gestational sac: Single (image 19), located to the left of midline near the lower body of the uterus. Yolk sac:  Not visible Embryo:  Not visible Cardiac Activity: Not detected Heart Rate: Not applicable bpm MSD: 8.5 mm   5 w  4 d Subchorionic hemorrhage:  None visualized. Maternal uterus/adnexae: The right ovary appears normal measuring 3.9 x 1.9 x 1.5 cm. The left ovary could not be identified. Trace simple appearing free fluid in the cul-de-sac. IMPRESSION: Probable early intrauterine gestational sac, but no yolk sac, fetal pole, or cardiac activity yet visualized. Recommend follow-up quantitative B-HCG levels and follow-up US in 14 days to confirm and assess viability. This recommendation follows SRU consensus guidelines: Diagnostic Criteria for Nonviable Pregnancy Early in the First Trimester. Malva Limes Med 2013; 433:2951-88. Electronically Signed   By: Odessa Fleming M.D.   On: 08/19/2020 04:56    MAU Management/MDM: Orders Placed This Encounter  Procedures   US OB Transvaginal   US OB Transvaginal   hCG, quantitative, pregnancy   Urinalysis, Routine w reflex microscopic Urine, Clean Catch   Discharge patient    Meds ordered this encounter  Medications   traMADol (ULTRAM) tablet 100 mg   traMADol (ULTRAM) 50 MG tablet    Sig: Take 1-2 tablets (50-100 mg  total) by mouth once for 1 dose.    Dispense:  10 tablet    Refill:  0    Order Specific Question:   Supervising Provider    Answer:   Reva Bores [[id number]]   NIFEdipine (PROCARDIA XL) 30 MG 24 hr tablet    Sig: Take 1 tablet (30 mg total) by mouth daily.    Dispense:  30 tablet    Refill:  11    Order Specific Question:   Supervising Provider    Answer:   Reva Bores [[id number]]    Hcg essentially unchanged with slight drop today.  US shows gestational sac still present but lower in uterus, migrating downward.  Consult Dr Shawnie Pons with assessment and findings.  Likely miscarriage in process.  F/U US in 1 week outpatient at [facility].  Tramadol 2 tabs given in MAU and Rx for Tramadol 50-100 mg PO Q 6 hours PRN x 10 tabs to pharmacy. Also, Rx for Procardia XL 30 mg daily.  List of PCPs given and pt encouraged to follow up due to HTN and DM.   Pt discharged with strict bleeding/miscarriage/return precautions.  ASSESSMENT 1. Chronic hypertension in obstetric context in first trimester   2. Inevitable complete miscarriage without complication   3. Vaginal bleeding in pregnancy, first trimester   4. [redacted] weeks gestation of pregnancy     PLAN Discharge home Allergies as of 08/21/2020   No Known Allergies      Medication List     STOP taking these medications    acetaminophen 325 MG tablet Commonly known as: TYLENOL   metFORMIN 500 MG tablet Commonly known as: GLUCOPHAGE   True Metrix Blood Glucose Test test strip Generic drug: glucose blood   TRUEplus Lancets 28G Misc       TAKE these medications    multivitamin-prenatal 27-0.8 MG Tabs tablet Take 1 tablet by mouth daily at 12 noon.   NIFEdipine 30 MG 24 hr tablet Commonly known as: Procardia XL Take 1 tablet (30 mg total) by mouth daily.   traMADol 50 MG tablet Commonly known as: ULTRAM Take 1-2 tablets (50-100 mg total) by mouth once for 1 dose.         Sharen Counter Certified Nurse-Midwife 08/21/2020  7:11  PM

## 2020-08-22 ENCOUNTER — Other Ambulatory Visit: Payer: Self-pay

## 2020-08-28 ENCOUNTER — Inpatient Hospital Stay (HOSPITAL_COMMUNITY)
Admission: AD | Admit: 2020-08-28 | Discharge: 2020-08-28 | Disposition: A | Discharge status: Home / Self Care | Payer: Self-pay | Attending: Obstetrics and Gynecology | Admitting: Obstetrics and Gynecology

## 2020-08-28 ENCOUNTER — Other Ambulatory Visit: Payer: Self-pay

## 2020-08-28 DIAGNOSIS — Z3A10 10 weeks gestation of pregnancy: Secondary | ICD-10-CM | POA: Insufficient documentation

## 2020-08-28 DIAGNOSIS — O034 Incomplete spontaneous abortion without complication: Secondary | ICD-10-CM | POA: Insufficient documentation

## 2020-08-28 NOTE — MAU Note (Signed)
[name] is a 50 y.o. at [redacted]w[redacted]d here in MAU reporting: ongoing bleeding since previous visit, states it is light. Is not having pain. States she is here because she was told to follow up in 1 week.  Onset of complaint: ongoing  Pain score: 0/10  Vitals:   08/28/20 1412  BP: (!) 147/76  Pulse: 92  Resp: 16  Temp: 98.4 F (36.9 C)  SpO2: 97%     Lab orders placed from triage: none

## 2020-08-28 NOTE — Discharge Instructions (Signed)
Monistat cream for vaginal itching

## 2020-08-28 NOTE — MAU Provider Note (Signed)
[name] is a 50 y.o. [phone] female who presents to MAU today for follow up US. She was seen on 08/21/20 for inevitable miscarriage and was told she would be scheduled for f/u US in 1 week. No one called to schedule her. She denies abd pain. She is still having a small amt of bleeding.   ROS: +VB No abd pain  O BP (!) 147/76 (BP Location: Right Arm)   Pulse 92   Temp 98.4 F (36.9 C) (Oral)   Resp 16   Ht 5\' 5"  (1.651 m)   Wt 93.1 kg   LMP 06/22/2020 (Approximate)   SpO2 97% Comment: room air  BMI 34.15 kg/m  Physical Exam Vitals and nursing note reviewed.  Constitutional:      Appearance: Normal appearance.  HENT:     Head: Normocephalic and atraumatic.  Cardiovascular:     Rate and Rhythm: Normal rate.  Pulmonary:     Effort: Pulmonary effort is normal. No respiratory distress.  Musculoskeletal:        General: Normal range of motion.  Neurological:     General: No focal deficit present.     Mental Status: She is alert and oriented to person, place, and time.  Psychiatric:        Mood and Affect: Mood normal.        Behavior: Behavior normal.    MDM: No emergent condition identified. Will send message to [facility] to get her f/u 06/24/2020 scheduled. Stable for discharge home.   A 1. Inevitable abortion     P Discharge from MAU in stable condition Warning signs for worsening condition that would warrant emergency follow-up discussed Patient may return to MAU as needed for pregnancy related complaints Message sent to [place] for f/u [name]  Allergies as of 08/28/2020   No Known Allergies      Medication List     TAKE these medications    multivitamin-prenatal 27-0.8 MG Tabs tablet Take 1 tablet by mouth daily at 12 noon.   NIFEdipine 30 MG 24 hr tablet Commonly known as: Procardia XL Take 1 tablet (30 mg total) by mouth daily.       Pt declined interpreter, verbalized understanding.  08/30/2020, CNM 08/28/2020 2:42 PM

## 2020-12-23 ENCOUNTER — Inpatient Hospital Stay (HOSPITAL_COMMUNITY)
Admission: AD | Admit: 2020-12-23 | Discharge: 2020-12-24 | Disposition: A | Discharge status: Home / Self Care | Payer: Self-pay | Attending: Obstetrics & Gynecology | Admitting: Obstetrics & Gynecology

## 2020-12-23 DIAGNOSIS — Z3A11 11 weeks gestation of pregnancy: Secondary | ICD-10-CM | POA: Insufficient documentation

## 2020-12-23 DIAGNOSIS — O10011 Pre-existing essential hypertension complicating pregnancy, first trimester: Secondary | ICD-10-CM | POA: Insufficient documentation

## 2020-12-23 DIAGNOSIS — R109 Unspecified abdominal pain: Secondary | ICD-10-CM

## 2020-12-23 DIAGNOSIS — O24111 Pre-existing diabetes mellitus, type 2, in pregnancy, first trimester: Secondary | ICD-10-CM | POA: Insufficient documentation

## 2020-12-23 DIAGNOSIS — Z3490 Encounter for supervision of normal pregnancy, unspecified, unspecified trimester: Secondary | ICD-10-CM

## 2020-12-23 DIAGNOSIS — Z8759 Personal history of other complications of pregnancy, childbirth and the puerperium: Secondary | ICD-10-CM

## 2020-12-23 DIAGNOSIS — O209 Hemorrhage in early pregnancy, unspecified: Secondary | ICD-10-CM | POA: Insufficient documentation

## 2020-12-24 ENCOUNTER — Inpatient Hospital Stay (HOSPITAL_COMMUNITY): Payer: Self-pay

## 2020-12-24 ENCOUNTER — Encounter (HOSPITAL_COMMUNITY): Payer: Self-pay | Admitting: *Deleted

## 2020-12-24 ENCOUNTER — Other Ambulatory Visit: Payer: Self-pay

## 2020-12-24 DIAGNOSIS — O4691 Antepartum hemorrhage, unspecified, first trimester: Secondary | ICD-10-CM

## 2020-12-24 DIAGNOSIS — Z3A11 11 weeks gestation of pregnancy: Secondary | ICD-10-CM

## 2020-12-24 LAB — URINALYSIS, MICROSCOPIC (REFLEX)

## 2020-12-24 LAB — CBC
HCT: 35.9 % — ABNORMAL LOW (ref 36.0–46.0)
Hemoglobin: 11.1 g/dL — ABNORMAL LOW (ref 12.0–15.0)
MCH: 21.4 pg — ABNORMAL LOW (ref 26.0–34.0)
MCHC: 30.9 g/dL (ref 30.0–36.0)
MCV: 69.2 fL — ABNORMAL LOW (ref 80.0–100.0)
Platelets: 348 10*3/uL (ref 150–400)
RBC: 5.19 MIL/uL — ABNORMAL HIGH (ref 3.87–5.11)
RDW: 18.4 % — ABNORMAL HIGH (ref 11.5–15.5)
WBC: 8 10*3/uL (ref 4.0–10.5)
nRBC: 0 % (ref 0.0–0.2)

## 2020-12-24 LAB — COMPREHENSIVE METABOLIC PANEL
ALT: 17 U/L (ref 0–44)
AST: 19 U/L (ref 15–41)
Albumin: 3.5 g/dL (ref 3.5–5.0)
Alkaline Phosphatase: 74 U/L (ref 38–126)
Anion gap: 7 (ref 5–15)
BUN: 14 mg/dL (ref 6–20)
CO2: 24 mmol/L (ref 22–32)
Calcium: 9.4 mg/dL (ref 8.9–10.3)
Chloride: 100 mmol/L (ref 98–111)
Creatinine, Ser: 0.57 mg/dL (ref 0.44–1.00)
GFR, Estimated: >60 mL/min (ref >60)
Glucose, Bld: 265 mg/dL — ABNORMAL HIGH (ref 70–99)
Potassium: 4.5 mmol/L (ref 3.5–5.1)
Sodium: 131 mmol/L — ABNORMAL LOW (ref 135–145)
Total Bilirubin: 0.3 mg/dL (ref 0.3–1.2)
Total Protein: 7.2 g/dL (ref 6.5–8.1)

## 2020-12-24 LAB — URINALYSIS, ROUTINE W REFLEX MICROSCOPIC
Bilirubin Urine: NEGATIVE
Glucose, UA: >=500 mg/dL — AB
Hgb urine dipstick: NEGATIVE
Ketones, ur: NEGATIVE mg/dL
Leukocytes,Ua: NEGATIVE
Nitrite: NEGATIVE
Protein, ur: NEGATIVE mg/dL
Specific Gravity, Urine: 1.025 (ref 1.005–1.030)
pH: 6 (ref 5.0–8.0)

## 2020-12-24 LAB — POCT PREGNANCY, URINE: Preg Test, Ur: POSITIVE — AB

## 2020-12-24 LAB — HCG, QUANTITATIVE, PREGNANCY: hCG, Beta Chain, Quant, S: 4089 m[IU]/mL — ABNORMAL HIGH (ref <5)

## 2020-12-24 MED ORDER — NIFEDIPINE ER OSMOTIC RELEASE 30 MG PO TB24
30.0000 mg | ORAL_TABLET | Freq: Once | ORAL | Status: AC
  Administered 2020-12-24: 30 mg via ORAL
  Filled 2020-12-24: qty 1

## 2020-12-24 MED ORDER — LABETALOL HCL 100 MG PO TABS: 200.0000 mg | ORAL_TABLET | Freq: Once | ORAL | Status: DC

## 2020-12-24 NOTE — MAU Provider Note (Addendum)
History     CSN: [id number]  Arrival date and time: 12/23/20 2358   Event Date/Time   First Provider Initiated Contact with Patient 12/24/20 0259      Chief Complaint  Patient presents with   Vaginal Bleeding   Abdominal Pain   HPI [name] is a 50 y.o. [id number] at [redacted]w[redacted]d who presents to MAU with chief complaints of suprapubic pain and vaginal spotting. These are new problems, onset two days ago. Her suprapubic pain is rated 7/10. Pain does not radiate. She denies aggravating or alleviating factors. She has not taken medication for this complaint. Patient's spotting is very light and visualized only when she wipes after voiding. Patient is s/p miscarriage in late August 2022 and is worried about the viability of this new pregnancy.   Patient acknowledges history of T2DM and CHTN. She is not currently taking medication for either condition. She endorses mild dizziness but denies all other complaints including weakness, vomiting, dysuria, syncope, fever.  OB History     Gravida  8   Para  3   Term  3   Preterm      AB  2   Living  2      SAB  2   IAB      Ectopic      Multiple  0   Live Births  3           Past Medical History:  Diagnosis Date   Hypertension    Type II diabetes mellitus with complication (HCC)     Past Surgical History:  Procedure Laterality Date   DILATION AND CURETTAGE OF UTERUS     HYSTEROSCOPY WITH RESECTOSCOPE  01/08/2009   MASTECTOMY      Family History  Problem Relation Age of Onset   Alcohol abuse Neg Hx     Social History   Tobacco Use   Smoking status: Never   Smokeless tobacco: Never  Vaping Use   Vaping Use: Never used  Substance Use Topics   Alcohol use: No   Drug use: No    Allergies: No Known Allergies  Medications Prior to Admission  Medication Sig Dispense Refill Last Dose   NIFEdipine (PROCARDIA XL) 30 MG 24 hr tablet Take 1 tablet (30 mg total) by mouth daily. 30 tablet 11    Prenatal Vit-Fe  Fumarate-FA (MULTIVITAMIN-PRENATAL) 27-0.8 MG TABS tablet Take 1 tablet by mouth daily at 12 noon.       Review of Systems  Gastrointestinal:  Positive for abdominal pain.  Genitourinary:  Positive for vaginal bleeding.  Neurological:  Positive for dizziness.  All other systems reviewed and are negative. Physical Exam   Blood pressure (!) 153/85, pulse 83, temperature 98.7 F (37.1 C), resp. rate 18, last menstrual period 10/08/2020, unknown if currently breastfeeding.  Physical Exam Vitals and nursing note reviewed. Exam conducted with a chaperone present.  Constitutional:      Appearance: She is well-developed. She is not ill-appearing.  Cardiovascular:     Rate and Rhythm: Normal rate and regular rhythm.     Heart sounds: Normal heart sounds.  Abdominal:     General: Abdomen is flat. Bowel sounds are normal.     Palpations: Abdomen is soft.     Tenderness: There is no abdominal tenderness. There is no right CVA tenderness, left CVA tenderness or guarding.  Skin:    Capillary Refill: Capillary refill takes less than 2 seconds.  Neurological:     Mental Status: She is  alert and oriented to person, place, and time.  Psychiatric:        Mood and Affect: Mood normal.        Behavior: Behavior normal.    MAU Course/MDM  Procedures  --Severe range BP in absence of previously prescribed Procardia 30 XL. Will restart this medication in MAU and continue outpatient --Elevated glucose noted on CMET. Patient previously advised to schedule followup appointment with PCP or OB/GYN to discuss glucose management. Patient confirms she has PCP. Was not aware she needed to schedule outpatient followup  Orders Placed This Encounter  Procedures   Wet prep, genital   US OB LESS THAN 14 WEEKS WITH OB TRANSVAGINAL   US OB Transvaginal   CBC   hCG, quantitative, pregnancy   Comprehensive metabolic panel   Urinalysis, Routine w reflex microscopic Urine, Clean Catch   Urinalysis, Microscopic  (reflex)   Pregnancy, urine POC   Discharge patient   Patient Vitals for the past 24 hrs:  BP Temp Pulse Resp  12/24/20 0318 (!) 151/89 -- 82 --  12/24/20 0317 (!) 151/89 -- 82 --  12/24/20 0301 (!) 160/76 -- 79 --  12/24/20 0249 (!) 153/85 -- 83 --  12/24/20 0146 134/79 -- 75 --  12/24/20 0134 137/61 -- 72 --  12/24/20 0101 (!) 168/80 98.7 F (37.1 C) 79 18   Results for orders placed or performed during the hospital encounter of 12/23/20 (from the past 24 hour(s))  Pregnancy, urine POC     Status: Abnormal   Collection Time: 12/24/20 12:11 AM  Result Value Ref Range   Preg Test, Ur POSITIVE (A) NEGATIVE  Urinalysis, Routine w reflex microscopic Urine, Clean Catch     Status: Abnormal   Collection Time: 12/24/20 12:34 AM  Result Value Ref Range   Color, Urine YELLOW YELLOW   APPearance CLEAR CLEAR   Specific Gravity, Urine 1.025 1.005 - 1.030   pH 6.0 5.0 - 8.0   Glucose, UA >=500 (A) NEGATIVE mg/dL   Hgb urine dipstick NEGATIVE NEGATIVE   Bilirubin Urine NEGATIVE NEGATIVE   Ketones, ur NEGATIVE NEGATIVE mg/dL   Protein, ur NEGATIVE NEGATIVE mg/dL   Nitrite NEGATIVE NEGATIVE   Leukocytes,Ua NEGATIVE NEGATIVE  Urinalysis, Microscopic (reflex)     Status: Abnormal   Collection Time: 12/24/20 12:34 AM  Result Value Ref Range   RBC / HPF 0-5 0 - 5 RBC/hpf   WBC, UA 0-5 0 - 5 WBC/hpf   Bacteria, UA RARE (A) NONE SEEN   Squamous Epithelial / LPF 0-5 0 - 5  CBC     Status: Abnormal   Collection Time: 12/24/20 12:40 AM  Result Value Ref Range   WBC 8.0 4.0 - 10.5 K/uL   RBC 5.19 (H) 3.87 - 5.11 MIL/uL   Hemoglobin 11.1 (L) 12.0 - 15.0 g/dL   HCT 49.4 (L) 49.6 - 75.9 %   MCV 69.2 (L) 80.0 - 100.0 fL   MCH 21.4 (L) 26.0 - 34.0 pg   MCHC 30.9 30.0 - 36.0 g/dL   RDW 16.3 (H) 84.6 - 65.9 %   Platelets 348 150 - 400 K/uL   nRBC 0.0 0.0 - 0.2 %  hCG, quantitative, pregnancy     Status: Abnormal   Collection Time: 12/24/20 12:40 AM  Result Value Ref Range   hCG, Beta Chain,  Quant, S 4,089 (H) <5 mIU/mL  Comprehensive metabolic panel     Status: Abnormal   Collection Time: 12/24/20 12:40 AM  Result Value Ref Range  Sodium 131 (L) 135 - 145 mmol/L   Potassium 4.5 3.5 - 5.1 mmol/L   Chloride 100 98 - 111 mmol/L   CO2 24 22 - 32 mmol/L   Glucose, Bld 265 (H) 70 - 99 mg/dL   BUN 14 6 - 20 mg/dL   Creatinine, Ser 4.40 0.44 - 1.00 mg/dL   Calcium 9.4 8.9 - 10.2 mg/dL   Total Protein 7.2 6.5 - 8.1 g/dL   Albumin 3.5 3.5 - 5.0 g/dL   AST 19 15 - 41 U/L   ALT 17 0 - 44 U/L   Alkaline Phosphatase 74 38 - 126 U/L   Total Bilirubin 0.3 0.3 - 1.2 mg/dL   GFR, Estimated >72 >53 mL/min   Anion gap 7 5 - 15   US OB LESS THAN 14 WEEKS WITH OB TRANSVAGINAL  Result Date: 12/24/2020 CLINICAL DATA:  Pelvic pain. EXAM: OBSTETRIC <14 WK [place] AND TRANSVAGINAL OB US TECHNIQUE: Both transabdominal and transvaginal ultrasound examinations were performed for complete evaluation of the gestation as well as the maternal uterus, adnexal regions, and pelvic cul-de-sac. Transvaginal technique was performed to assess early pregnancy. COMPARISON:  August 21, 2020 FINDINGS: Intrauterine gestational sac: Single Yolk sac:  Visualized. Embryo:  Not Visualized. Cardiac Activity: Not Visualized. Heart Rate: N/A  bpm MSD: 5.6 mm   5 w   2 d Subchorionic hemorrhage:  None visualized. Maternal uterus/adnexae: The bilateral ovaries are visualized and are normal in appearance. No pelvic free fluid is seen. IMPRESSION: Probable early intrauterine gestational sac and yolk sac, but no fetal pole or cardiac activity yet visualized. Recommend follow-up quantitative B-HCG levels and follow-up US in 14 days to assess viability. This recommendation follows SRU consensus guidelines: Diagnostic Criteria for Nonviable Pregnancy Early in the First Trimester. Malva Limes Med 2013; 664:4034-74. Electronically Signed   By: Aram Candela M.D.   On: 12/24/2020 02:50    Assessment and Plan  --50 y.o. [id number] with confirmed  IUP, no fetal pole --T2DM --CHTN, restart Procardia 30XL --Viability scan ordered for two weeks from now at [facility] --Blood type O POS --Discharge home in stable condition  F/U: Patient given contact information for [facility] for HTN and T2DM management if PCP not available  Calvert Cantor, CNM 12/24/2020, 5:20 AM

## 2020-12-24 NOTE — MAU Note (Signed)
PT reports she has missed her period for 2 months  had abd cramping on and off for the past 2 days. Has also felt dizzy. Was worried because she had a miscarriage last August.

## 2020-12-30 ENCOUNTER — Other Ambulatory Visit: Payer: Self-pay

## 2020-12-30 ENCOUNTER — Inpatient Hospital Stay (HOSPITAL_COMMUNITY)
Admission: AD | Admit: 2020-12-30 | Discharge: 2020-12-30 | Disposition: A | Discharge status: Home / Self Care | Payer: Self-pay | Attending: Obstetrics and Gynecology | Admitting: Obstetrics and Gynecology

## 2020-12-30 ENCOUNTER — Inpatient Hospital Stay (HOSPITAL_COMMUNITY): Payer: Self-pay

## 2020-12-30 ENCOUNTER — Encounter (HOSPITAL_COMMUNITY): Payer: Self-pay | Admitting: Obstetrics and Gynecology

## 2020-12-30 DIAGNOSIS — O10919 Unspecified pre-existing hypertension complicating pregnancy, unspecified trimester: Secondary | ICD-10-CM

## 2020-12-30 DIAGNOSIS — O209 Hemorrhage in early pregnancy, unspecified: Secondary | ICD-10-CM | POA: Insufficient documentation

## 2020-12-30 DIAGNOSIS — Z3A01 Less than 8 weeks gestation of pregnancy: Secondary | ICD-10-CM

## 2020-12-30 DIAGNOSIS — O4690 Antepartum hemorrhage, unspecified, unspecified trimester: Secondary | ICD-10-CM

## 2020-12-30 DIAGNOSIS — O26891 Other specified pregnancy related conditions, first trimester: Secondary | ICD-10-CM | POA: Insufficient documentation

## 2020-12-30 DIAGNOSIS — R102 Pelvic and perineal pain: Secondary | ICD-10-CM | POA: Insufficient documentation

## 2020-12-30 DIAGNOSIS — Z79899 Other long term (current) drug therapy: Secondary | ICD-10-CM | POA: Insufficient documentation

## 2020-12-30 DIAGNOSIS — O10911 Unspecified pre-existing hypertension complicating pregnancy, first trimester: Secondary | ICD-10-CM | POA: Insufficient documentation

## 2020-12-30 DIAGNOSIS — N939 Abnormal uterine and vaginal bleeding, unspecified: Secondary | ICD-10-CM

## 2020-12-30 DIAGNOSIS — Z3A11 11 weeks gestation of pregnancy: Secondary | ICD-10-CM | POA: Insufficient documentation

## 2020-12-30 LAB — HCG, QUANTITATIVE, PREGNANCY: hCG, Beta Chain, Quant, S: 3743 m[IU]/mL — ABNORMAL HIGH (ref <5)

## 2020-12-30 MED ORDER — ACETAMINOPHEN 500 MG PO TABS
1000.0000 mg | ORAL_TABLET | Freq: Once | ORAL | Status: AC
  Administered 2020-12-30: 08:00:00 1000 mg via ORAL
  Filled 2020-12-30: qty 2

## 2020-12-30 MED ORDER — NIFEDIPINE ER OSMOTIC RELEASE 30 MG PO TB24
30.0000 mg | ORAL_TABLET | Freq: Every day | ORAL | 11 refills | Status: AC
  Filled 2020-12-30: qty 30, 30d supply, fill #0

## 2020-12-30 MED ORDER — NIFEDIPINE ER OSMOTIC RELEASE 30 MG PO TB24
30.0000 mg | ORAL_TABLET | Freq: Once | ORAL | Status: AC
  Administered 2020-12-30: 08:00:00 30 mg via ORAL
  Filled 2020-12-30: qty 1

## 2020-12-30 NOTE — MAU Provider Note (Signed)
History     CSN: [id number]  Arrival date and time: 12/30/20 0335   None     Chief Complaint  Patient presents with   Vaginal Bleeding   HPI [name] is a 50 y.o. [id number] at [redacted]w[redacted]d by LMP who presents to MAU for bleeding. Patient reports she went to the bathroom this morning and noticed blood in the toilet. She reports she is also having some lower abdominal cramping, but this has been ongoing since last week. She denies passing any blood clots or tissue. She is not having to wear a pad. She reports that she noticed a lot of painful bumps on vaginal area on Monday. Reports that she has had these bumps in the past and was told it was a "rash" and was given some cream to help it go away.   OB History     Gravida  8   Para  3   Term  3   Preterm      AB  2   Living  2      SAB  2   IAB      Ectopic      Multiple  0   Live Births  3           Past Medical History:  Diagnosis Date   Hypertension    Type II diabetes mellitus with complication (HCC)     Past Surgical History:  Procedure Laterality Date   DILATION AND CURETTAGE OF UTERUS     HYSTEROSCOPY WITH RESECTOSCOPE  01/08/2009   MASTECTOMY      Family History  Problem Relation Age of Onset   Alcohol abuse Neg Hx     Social History   Tobacco Use   Smoking status: Never   Smokeless tobacco: Never  Vaping Use   Vaping Use: Never used  Substance Use Topics   Alcohol use: No   Drug use: No    Allergies: No Known Allergies  Medications Prior to Admission  Medication Sig Dispense Refill Last Dose   Prenatal Vit-Fe Fumarate-FA (MULTIVITAMIN-PRENATAL) 27-0.8 MG TABS tablet Take 1 tablet by mouth daily at 12 noon.      [DISCONTINUED] NIFEdipine (PROCARDIA XL) 30 MG 24 hr tablet Take 1 tablet (30 mg total) by mouth daily. 30 tablet 11 More than a month    Review of Systems  Constitutional: Negative.   Respiratory: Negative.    Cardiovascular: Negative.   Gastrointestinal:  Positive for  abdominal pain.  Genitourinary:  Positive for vaginal bleeding. Negative for dysuria.  Musculoskeletal: Negative.   Neurological: Negative.    Physical Exam   Patient Vitals for the past 24 hrs:  BP Temp Temp src Pulse Resp SpO2 Height Weight  12/30/20 0824 (!) 158/80 -- -- 70 -- 100 % -- --  12/30/20 0759 (!) 177/89 -- -- 72 -- 100 % -- --  12/30/20 0744 -- 98.8 F (37.1 C) Oral -- 14 100 % -- --  12/30/20 0645 (!) 153/68 -- -- 69 -- -- -- --  12/30/20 0600 (!) 140/54 -- -- 74 -- -- -- --  12/30/20 0532 (!) 176/76 -- -- 72 -- -- -- --  12/30/20 0516 -- -- -- 73 -- -- -- --  12/30/20 0503 (!) 154/69 -- -- 73 -- -- -- --  12/30/20 0501 (!) 154/127 -- -- 73 -- -- -- --  12/30/20 0446 (!) 156/68 -- -- 70 -- -- -- --  12/30/20 0431 (!) 158/72 -- -- 72 -- -- -- --  12/30/20 0359 (!) 171/85 98.6 F (37 C) Oral 73 17 100 % 5\' 6"  (1.676 m) 97.1 kg    Physical Exam Vitals and nursing note reviewed. Exam conducted with a chaperone present.  Constitutional:      General: She is not in acute distress.    Appearance: She is obese.  Eyes:     Pupils: Pupils are equal, round, and reactive to light.  Cardiovascular:     Rate and Rhythm: Normal rate.  Pulmonary:     Effort: Pulmonary effort is normal.  Abdominal:     Palpations: Abdomen is soft.     Tenderness: There is no abdominal tenderness.  Genitourinary:    Comments: Left labia minora with one ? healed lesion otherwise NEFG, vagina pink with rugae, small amount of dark mucoid appearing blood, no active bleeding, cervix pink, smooth without lesion, appears visually closed.  Musculoskeletal:        General: Normal range of motion.     Cervical back: Normal range of motion.  Skin:    General: Skin is warm and dry.  Neurological:     General: No focal deficit present.     Mental Status: She is alert and oriented to person, place, and time.  Psychiatric:        Mood and Affect: Mood normal.        Behavior: Behavior normal.         Thought Content: Thought content normal.        Judgment: Judgment normal.    MAU Course  Procedures HCG HSV culture Tylenol PO  MDM Multiple attempts at trying to contact [employer] interpretor. After several tries was told there was no [employer] on call. Given extreme circumstance, patient's husband interpreted.  [place] unchanged from previous ultrasound 6 days ago.  Prior to discharge patient BP elevated. Patient reports she has not taken BP meds in 1 week. Procardia 30XL ordered prior to discharge  Assessment and Plan  IUP, no fetal pole Vaginal bleeding cHTN- continue Procardia 30XL   - Discharge home in stable condition - Patient has viability scan ordered [facility]. Patient to keep that appointment - Strict return precautions reviewed. Return to MAU as needed for worsening symptoms - Pelvic rest. Tylenol prn for cramping.  - Advised patient on importance of taking BP medications as prescribed.     [facility], CNM 12/30/2020, 8:28 AM

## 2020-12-30 NOTE — MAU Note (Signed)
..  [name] is a 50 y.o. at [redacted]w[redacted]d here in MAU reporting: vaginal bleeding in the toilet when she goes to the bathroom. Denies having to wear a pad. Reports lower abdominal cramping. Last intercourse: 1 week  Pain score: 9/10 Vitals:   12/30/20 0359  BP: (!) 171/85  Pulse: 73  Resp: 17  Temp: 98.6 F (37 C)  SpO2: 100%

## 2021-01-01 LAB — HSV CULTURE AND TYPING

## 2021-01-04 ENCOUNTER — Other Ambulatory Visit: Payer: Self-pay

## 2021-03-16 ENCOUNTER — Other Ambulatory Visit: Payer: Self-pay

## 2021-10-17 NOTE — Telephone Encounter (Signed)
error 

## 2022-04-14 ENCOUNTER — Encounter (HOSPITAL_COMMUNITY): Payer: Self-pay | Admitting: Obstetrics & Gynecology

## 2022-04-14 ENCOUNTER — Inpatient Hospital Stay (HOSPITAL_COMMUNITY)
Admission: AD | Admit: 2022-04-14 | Discharge: 2022-04-15 | Disposition: A | Discharge status: Home / Self Care | Payer: Self-pay | Attending: Obstetrics & Gynecology | Admitting: Obstetrics & Gynecology

## 2022-04-14 DIAGNOSIS — R109 Unspecified abdominal pain: Secondary | ICD-10-CM | POA: Insufficient documentation

## 2022-04-14 DIAGNOSIS — Z3202 Encounter for pregnancy test, result negative: Secondary | ICD-10-CM | POA: Insufficient documentation

## 2022-04-14 DIAGNOSIS — R079 Chest pain, unspecified: Secondary | ICD-10-CM | POA: Insufficient documentation

## 2022-04-14 NOTE — MAU Note (Addendum)
.  [name] is a 52 y.o. at Unknown here in MAU reporting: for 3 days has not felt good - states pain that starts in chest and goes all the way down her abdomen. Has not had period in a month. Denies pregnancy tests at home.  LMP: unsure  Onset of complaint: 3 days  Pain score: 8 Vitals:   04/15/22 0008  BP: (!) 156/73  Pulse: 91  Resp: 19  Temp: 98.6 F (37 C)  SpO2: 100%     FHT:NA  Lab orders placed from triage:  urine pregnancy

## 2022-04-14 NOTE — MAU Note (Incomplete)
.  [name] is a 52 y.o. at Unknown here in MAU reporting: *** LMP: *** Onset of complaint: *** Pain score: *** There were no vitals filed for this visit.   FHT:*** Lab orders placed from triage:

## 2022-04-15 ENCOUNTER — Other Ambulatory Visit: Payer: Self-pay

## 2022-04-15 DIAGNOSIS — Z3202 Encounter for pregnancy test, result negative: Secondary | ICD-10-CM

## 2022-04-15 DIAGNOSIS — R109 Unspecified abdominal pain: Secondary | ICD-10-CM

## 2022-04-15 LAB — POCT PREGNANCY, URINE: Preg Test, Ur: NEGATIVE

## 2022-04-15 NOTE — MAU Provider Note (Signed)
Event Date/Time   First Provider Initiated Contact with Patient 04/15/22 0011      S Ms. [name] is a 52 y.o. [id number] patient who presents to MAU today with complaint of abdominal pain, amenorrhea, & vaginal discharge. States she hasn't had a period in over a month but didn't think she was pregnant. For the last 3 days has had pain from her mid chest to her pelvis that occurs when she stands up, pain makes it difficult to breathe. Otherwise denies SOB. Denies fever, cough, palpitations, or vaginal bleeding. Has had some vaginal discharge & irritation.    O BP (!) 156/73 (BP Location: Right Arm)   Pulse 91   Temp 98.6 F (37 C) (Oral)   Resp 19   Wt 94.8 kg   SpO2 100%   BMI 33.75 kg/m  Physical Exam Vitals and nursing note reviewed.  Constitutional:      General: She is not in acute distress.    Appearance: She is well-developed. She is not ill-appearing.  HENT:     Head: Normocephalic and atraumatic.  Eyes:     General: No scleral icterus.       Right eye: No discharge.        Left eye: No discharge.     Conjunctiva/sclera: Conjunctivae normal.  Pulmonary:     Effort: Pulmonary effort is normal. No respiratory distress.  Neurological:     General: No focal deficit present.     Mental Status: She is alert.  Psychiatric:        Mood and Affect: Mood normal.        Behavior: Behavior normal.     A Medical screening exam complete 1. Negative pregnancy test   2. Chest pain, unspecified type   3. Abdominal pain, unspecified abdominal location      P Discharge from MAU in stable condition Patient given the option of transfer to [facility] for further evaluation or seek care in outpatient facility of choice - patient does not want to be evaluated in ED tonight. Instructed patient to present to ED if symptoms worsen Can f/u with Gyn outpatient for vaginal symptoms.   Judeth Horn, NP 04/15/2022 12:11 AM

## 2022-07-21 ENCOUNTER — Ambulatory Visit
Admission: EM | Admit: 2022-07-21 | Discharge: 2022-07-21 | Disposition: A | Discharge status: Home / Self Care | Payer: Self-pay | Attending: Internal Medicine | Admitting: Internal Medicine

## 2022-07-21 DIAGNOSIS — B356 Tinea cruris: Secondary | ICD-10-CM

## 2022-07-21 DIAGNOSIS — R300 Dysuria: Secondary | ICD-10-CM

## 2022-07-21 DIAGNOSIS — N898 Other specified noninflammatory disorders of vagina: Secondary | ICD-10-CM

## 2022-07-21 LAB — POCT URINALYSIS DIP (MANUAL ENTRY)
Bilirubin, UA: NEGATIVE
Glucose, UA: NEGATIVE mg/dL
Ketones, POC UA: NEGATIVE mg/dL
Leukocytes, UA: NEGATIVE
Nitrite, UA: NEGATIVE
Protein Ur, POC: NEGATIVE mg/dL
Spec Grav, UA: 1.02 (ref 1.010–1.025)
Urobilinogen, UA: 0.2 E.U./dL
pH, UA: 5.5 (ref 5.0–8.0)

## 2022-07-21 LAB — POCT URINE PREGNANCY: Preg Test, Ur: NEGATIVE

## 2022-07-21 MED ORDER — KETOCONAZOLE 2 % EX CREA: 1.0000 | TOPICAL_CREAM | Freq: Every day | CUTANEOUS | 0 refills | Status: AC

## 2022-07-21 NOTE — Discharge Instructions (Signed)
Your urine was negative for infection.  Your exam is consistent with a fungal infection around your groin.  Start ketoconazole antifungal cream topically daily for 14 days.  Please follow-up with your PCP if your symptoms do not improve.  Please go to emergency room for any worsening symptoms.  Hope you feel better soon!

## 2022-07-21 NOTE — ED Provider Notes (Signed)
[facility]    CSN: [id number] Arrival date & time: 07/21/22  1416      History   Chief Complaint Chief Complaint  Patient presents with   Pruritis    HPI [name] is a 52 y.o. female presents for evaluation of a itching.  Patient is speak some English but her husband does help to translate.  Patient reports 1 week of an external vaginal itching and some "bumps".  Also reports some pain.  Denies any vaginal discharge or internal itching.  Does have some mild dysuria but denies hematuria, fevers, nausea/vomiting, flank pain.  No STD concern or exposure.  She has been using over-the-counter antifungal creams without improvement.  No other concerns at this time.  HPI  Past Medical History:  Diagnosis Date   Hypertension    Type II diabetes mellitus with complication [facility])     Patient Active Problem List   Diagnosis Date Noted   Encounter for initial prescription of contraceptive pills 01/05/2020   Routine health maintenance 01/05/2020   Severe preclampsia superimposed on chronic hypertension, delivered 08/17/2018   Type 2 diabetes mellitus affecting pregnancy, antepartum 07/17/2018    Past Surgical History:  Procedure Laterality Date   DILATION AND CURETTAGE OF UTERUS     HYSTEROSCOPY WITH RESECTOSCOPE  01/08/2009   MASTECTOMY      OB History     Gravida  6   Para  3   Term  3   Preterm      AB  3   Living  2      SAB  3   IAB      Ectopic      Multiple  0   Live Births  3            Home Medications    Prior to Admission medications   Medication Sig Start Date End Date Taking? Authorizing Provider  ketoconazole (NIZORAL) 2 % cream Apply 1 Application topically daily for 14 days. 07/21/22 08/04/22 Yes Radford Pax, NP  NIFEdipine (PROCARDIA XL) 30 MG 24 hr tablet Take 1 tablet (30 mg total) by mouth daily. 12/30/20   [facility], CNM  Prenatal Vit-Fe Fumarate-FA (MULTIVITAMIN-PRENATAL) 27-0.8 MG TABS tablet Take 1  tablet by mouth daily at 12 noon.    [provider]    Family History Family History  Problem Relation Age of Onset   Alcohol abuse Neg Hx     Social History Social History   Tobacco Use   Smoking status: Never   Smokeless tobacco: Never  Vaping Use   Vaping status: Never Used  Substance Use Topics   Alcohol use: No   Drug use: No     Allergies   Patient has no known allergies.   Review of Systems Review of Systems  Genitourinary:  Positive for dysuria.       Vaginal itching     Physical Exam Triage Vital Signs ED Triage Vitals  Encounter Vitals Group     BP 07/21/22 1427 (!) 148/89     Systolic BP Percentile --      Diastolic BP Percentile --      Pulse Rate 07/21/22 1427 77     Resp 07/21/22 1427 16     Temp 07/21/22 1427 98.9 F (37.2 C)     Temp Source 07/21/22 1427 Oral     SpO2 07/21/22 1427 95 %     Weight --      Height --  Head Circumference --      Peak Flow --      Pain Score 07/21/22 1429 1     Pain Loc --      Pain Education --      Exclude from Growth Chart --    No data found.  Updated Vital Signs BP (!) 148/89 (BP Location: Left Arm)   Pulse 77   Temp 98.9 F (37.2 C) (Oral)   Resp 16   LMP 07/21/2022   SpO2 95%   Visual Acuity Right Eye Distance:   Left Eye Distance:   Bilateral Distance:    Right Eye Near:   Left Eye Near:    Bilateral Near:     Physical Exam Vitals and nursing note reviewed. Exam conducted with a chaperone present Dewain Penning RN).  Constitutional:      General: She is not in acute distress.    Appearance: Normal appearance. She is not ill-appearing.  HENT:     Head: Normocephalic and atraumatic.  Eyes:     Pupils: Pupils are equal, round, and reactive to light.  Cardiovascular:     Rate and Rhythm: Normal rate.  Pulmonary:     Effort: Pulmonary effort is normal.  Genitourinary:    Labia:        Right: No rash, tenderness or lesion.        Left: No rash or lesion.       Comments: Scaly slightly raised ringlike rash to bilateral groin.  No lesions or vesicles. Skin:    General: Skin is warm and dry.  Neurological:     General: No focal deficit present.     Mental Status: She is alert and oriented to person, place, and time.  Psychiatric:        Mood and Affect: Mood normal.        Behavior: Behavior normal.      UC Treatments / Results  Labs (all labs ordered are listed, but only abnormal results are displayed) Labs Reviewed  POCT URINALYSIS DIP (MANUAL ENTRY) - Abnormal; Notable for the following components:      Result Value   Clarity, UA hazy (*)    Blood, UA large (*)    All other components within normal limits  POCT URINE PREGNANCY    EKG   Radiology No results found.  Procedures Procedures (including critical care time)  Medications Ordered in UC Medications - No data to display  Initial Impression / Assessment and Plan / UC Course  I have reviewed the triage vital signs and the nursing notes.  Pertinent labs & imaging results that were available during my care of the patient were reviewed by me and considered in my medical decision making (see chart for details).     Reviewed exam and symptoms with patient.  No red flags.  Discussed fungal infection.  Trial of ketoconazole topically for 2 weeks.  Advised to keep area clean and dry.  PCP follow-up if symptoms do not improve.  ER precautions reviewed and patient and husband verbalized understanding Final Clinical Impressions(s) / UC Diagnoses   Final diagnoses:  Dysuria  Vaginal itching  Tinea cruris     Discharge Instructions      Your urine was negative for infection.  Your exam is consistent with a fungal infection around your groin.  Start ketoconazole antifungal cream topically daily for 14 days.  Please follow-up with your PCP if your symptoms do not improve.  Please go to emergency room for any  worsening symptoms.  Hope you feel better soon!     ED  Prescriptions     Medication Sig Dispense Auth. Provider   ketoconazole (NIZORAL) 2 % cream Apply 1 Application topically daily for 14 days. 30 g Radford Pax, NP      PDMP not reviewed this encounter.   Radford Pax, NP 07/21/22 [phone]

## 2022-07-21 NOTE — ED Triage Notes (Signed)
Pt presents to UC w/ c/o groin itching x1 week.
